# Patient Record
Sex: Male | Born: 1940 | Race: White | Hispanic: No | Marital: Married | Smoking: Never smoker
Health system: Southern US, Community
[De-identification: ages and names within clinical notes are randomized; demographics above are authoritative.]

## PROBLEM LIST (undated history)

## (undated) DIAGNOSIS — B029 Zoster without complications: Secondary | ICD-10-CM

## (undated) DIAGNOSIS — R112 Nausea with vomiting, unspecified: Secondary | ICD-10-CM

## (undated) DIAGNOSIS — K623 Rectal prolapse: Secondary | ICD-10-CM

## (undated) DIAGNOSIS — D369 Benign neoplasm, unspecified site: Secondary | ICD-10-CM

## (undated) DIAGNOSIS — M1990 Unspecified osteoarthritis, unspecified site: Secondary | ICD-10-CM

## (undated) DIAGNOSIS — E7800 Pure hypercholesterolemia, unspecified: Secondary | ICD-10-CM

## (undated) DIAGNOSIS — K5790 Diverticulosis of intestine, part unspecified, without perforation or abscess without bleeding: Secondary | ICD-10-CM

## (undated) DIAGNOSIS — L439 Lichen planus, unspecified: Secondary | ICD-10-CM

## (undated) DIAGNOSIS — K649 Unspecified hemorrhoids: Secondary | ICD-10-CM

## (undated) DIAGNOSIS — E611 Iron deficiency: Secondary | ICD-10-CM

## (undated) DIAGNOSIS — H269 Unspecified cataract: Secondary | ICD-10-CM

## (undated) DIAGNOSIS — Z9889 Other specified postprocedural states: Secondary | ICD-10-CM

## (undated) DIAGNOSIS — I10 Essential (primary) hypertension: Secondary | ICD-10-CM

## (undated) DIAGNOSIS — N400 Enlarged prostate without lower urinary tract symptoms: Secondary | ICD-10-CM

## (undated) DIAGNOSIS — D649 Anemia, unspecified: Secondary | ICD-10-CM

## (undated) DIAGNOSIS — D126 Benign neoplasm of colon, unspecified: Secondary | ICD-10-CM

## (undated) DIAGNOSIS — C4491 Basal cell carcinoma of skin, unspecified: Secondary | ICD-10-CM

## (undated) DIAGNOSIS — H9319 Tinnitus, unspecified ear: Secondary | ICD-10-CM

## (undated) HISTORY — DX: Unspecified osteoarthritis, unspecified site: M19.90

## (undated) HISTORY — PX: OTHER SURGICAL HISTORY: SHX169

## (undated) HISTORY — DX: Rectal prolapse: K62.3

## (undated) HISTORY — DX: Tinnitus, unspecified ear: H93.19

## (undated) HISTORY — DX: Unspecified cataract: H26.9

## (undated) HISTORY — PX: COLONOSCOPY: SHX174

## (undated) HISTORY — PX: INGUINAL HERNIA REPAIR: SUR1180

## (undated) HISTORY — PX: KNEE SURGERY: SHX244

## (undated) HISTORY — DX: Diverticulosis of intestine, part unspecified, without perforation or abscess without bleeding: K57.90

## (undated) HISTORY — DX: Benign neoplasm of colon, unspecified: D12.6

## (undated) HISTORY — DX: Iron deficiency: E61.1

## (undated) HISTORY — DX: Basal cell carcinoma of skin, unspecified: C44.91

## (undated) HISTORY — DX: Unspecified hemorrhoids: K64.9

## (undated) HISTORY — DX: Pure hypercholesterolemia, unspecified: E78.00

## (undated) HISTORY — PX: ROTATOR CUFF REPAIR: SHX139

## (undated) HISTORY — PX: EYE SURGERY: SHX253

## (undated) HISTORY — DX: Lichen planus, unspecified: L43.9

## (undated) HISTORY — DX: Zoster without complications: B02.9

## (undated) HISTORY — DX: Benign prostatic hyperplasia without lower urinary tract symptoms: N40.0

## (undated) HISTORY — DX: Essential (primary) hypertension: I10

---
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
## 1997-09-22 ENCOUNTER — Other Ambulatory Visit: Admission: RE | Admit: 1997-09-22 | Discharge: 1997-09-22 | Payer: Self-pay | Admitting: Otolaryngology

## 1998-09-11 ENCOUNTER — Other Ambulatory Visit: Admission: RE | Admit: 1998-09-11 | Discharge: 1998-09-11 | Payer: Self-pay | Admitting: Otolaryngology

## 2002-07-20 ENCOUNTER — Ambulatory Visit (HOSPITAL_COMMUNITY): Admission: RE | Admit: 2002-07-20 | Discharge: 2002-07-20 | Payer: Self-pay | Admitting: Family Medicine

## 2002-08-20 ENCOUNTER — Inpatient Hospital Stay (HOSPITAL_COMMUNITY): Admission: EM | Admit: 2002-08-20 | Discharge: 2002-08-22 | Payer: Self-pay | Admitting: *Deleted

## 2002-08-20 ENCOUNTER — Encounter: Payer: Self-pay | Admitting: Family Medicine

## 2004-03-29 ENCOUNTER — Ambulatory Visit: Payer: Self-pay | Admitting: Oncology

## 2004-07-12 ENCOUNTER — Ambulatory Visit (HOSPITAL_COMMUNITY): Admission: RE | Admit: 2004-07-12 | Discharge: 2004-07-12 | Payer: Self-pay | Admitting: General Surgery

## 2004-07-12 DIAGNOSIS — Z9889 Other specified postprocedural states: Secondary | ICD-10-CM | POA: Insufficient documentation

## 2004-09-30 ENCOUNTER — Ambulatory Visit: Payer: Self-pay | Admitting: Oncology

## 2005-03-31 ENCOUNTER — Ambulatory Visit: Payer: Self-pay | Admitting: Oncology

## 2005-09-28 ENCOUNTER — Ambulatory Visit: Payer: Self-pay | Admitting: Oncology

## 2005-09-30 LAB — CBC WITH DIFFERENTIAL/PLATELET
BASO%: 0.6 % (ref 0.0–2.0)
Basophils Absolute: 0 10*3/uL (ref 0.0–0.1)
EOS%: 3 % (ref 0.0–7.0)
Eosinophils Absolute: 0.1 10*3/uL (ref 0.0–0.5)
HCT: 41.5 % (ref 38.7–49.9)
HGB: 14.2 g/dL (ref 13.0–17.1)
LYMPH%: 21 % (ref 14.0–48.0)
MCH: 32.1 pg (ref 28.0–33.4)
MCHC: 34.3 g/dL (ref 32.0–35.9)
MCV: 93.4 fL (ref 81.6–98.0)
MONO#: 0.4 10*3/uL (ref 0.1–0.9)
MONO%: 8.9 % (ref 0.0–13.0)
NEUT#: 2.9 10*3/uL (ref 1.5–6.5)
NEUT%: 66.5 % (ref 40.0–75.0)
Platelets: 288 10*3/uL (ref 145–400)
RBC: 4.44 10*6/uL (ref 4.20–5.71)
RDW: 13.3 % (ref 11.2–14.6)
WBC: 4.4 10*3/uL (ref 4.0–10.0)
lymph#: 0.9 10*3/uL (ref 0.9–3.3)

## 2005-09-30 LAB — IRON AND TIBC
%SAT: 36 % (ref 20–55)
Iron: 119 ug/dL (ref 42–165)
TIBC: 333 ug/dL (ref 215–435)
UIBC: 214 ug/dL

## 2005-09-30 LAB — FERRITIN: Ferritin: 36 ng/mL (ref 22–322)

## 2006-03-27 ENCOUNTER — Ambulatory Visit: Payer: Self-pay | Admitting: Oncology

## 2006-03-31 LAB — COMPREHENSIVE METABOLIC PANEL
ALT: 18 U/L (ref 0–53)
AST: 19 U/L (ref 0–37)
Albumin: 4.2 g/dL (ref 3.5–5.2)
Alkaline Phosphatase: 53 U/L (ref 39–117)
BUN: 19 mg/dL (ref 6–23)
CO2: 26 mEq/L (ref 19–32)
Calcium: 8.8 mg/dL (ref 8.4–10.5)
Chloride: 108 mEq/L (ref 96–112)
Creatinine, Ser: 0.98 mg/dL (ref 0.40–1.50)
Glucose, Bld: 59 mg/dL — ABNORMAL LOW (ref 70–99)
Potassium: 4.3 mEq/L (ref 3.5–5.3)
Sodium: 139 mEq/L (ref 135–145)
Total Bilirubin: 0.5 mg/dL (ref 0.3–1.2)
Total Protein: 6.3 g/dL (ref 6.0–8.3)

## 2006-03-31 LAB — CBC WITH DIFFERENTIAL/PLATELET
BASO%: 0.7 % (ref 0.0–2.0)
Basophils Absolute: 0 10*3/uL (ref 0.0–0.1)
EOS%: 3.1 % (ref 0.0–7.0)
Eosinophils Absolute: 0.2 10*3/uL (ref 0.0–0.5)
HCT: 39.4 % (ref 38.7–49.9)
HGB: 13.6 g/dL (ref 13.0–17.1)
LYMPH%: 18.2 % (ref 14.0–48.0)
MCH: 32.4 pg (ref 28.0–33.4)
MCHC: 34.5 g/dL (ref 32.0–35.9)
MCV: 94.1 fL (ref 81.6–98.0)
MONO#: 0.4 10*3/uL (ref 0.1–0.9)
MONO%: 8.1 % (ref 0.0–13.0)
NEUT#: 3.5 10*3/uL (ref 1.5–6.5)
NEUT%: 69.9 % (ref 40.0–75.0)
Platelets: 265 10*3/uL (ref 145–400)
RBC: 4.19 10*6/uL — ABNORMAL LOW (ref 4.20–5.71)
RDW: 13.2 % (ref 11.2–14.6)
WBC: 5 10*3/uL (ref 4.0–10.0)
lymph#: 0.9 10*3/uL (ref 0.9–3.3)

## 2006-03-31 LAB — LACTATE DEHYDROGENASE: LDH: 132 U/L (ref 94–250)

## 2006-03-31 LAB — IRON AND TIBC
%SAT: 40 % (ref 20–55)
Iron: 113 ug/dL (ref 42–165)
TIBC: 286 ug/dL (ref 215–435)
UIBC: 173 ug/dL

## 2006-03-31 LAB — FERRITIN: Ferritin: 36 ng/mL (ref 22–322)

## 2007-03-26 ENCOUNTER — Ambulatory Visit: Payer: Self-pay | Admitting: Oncology

## 2007-03-30 LAB — CBC WITH DIFFERENTIAL/PLATELET
BASO%: 1.3 % (ref 0.0–2.0)
Basophils Absolute: 0.1 10*3/uL (ref 0.0–0.1)
EOS%: 2.9 % (ref 0.0–7.0)
Eosinophils Absolute: 0.1 10*3/uL (ref 0.0–0.5)
HCT: 39 % (ref 38.7–49.9)
HGB: 13.8 g/dL (ref 13.0–17.1)
LYMPH%: 23 % (ref 14.0–48.0)
MCH: 32.9 pg (ref 28.0–33.4)
MCHC: 35.3 g/dL (ref 32.0–35.9)
MCV: 93.2 fL (ref 81.6–98.0)
MONO#: 0.4 10*3/uL (ref 0.1–0.9)
MONO%: 9.4 % (ref 0.0–13.0)
NEUT#: 2.9 10*3/uL (ref 1.5–6.5)
NEUT%: 63.4 % (ref 40.0–75.0)
Platelets: 309 10*3/uL (ref 145–400)
RBC: 4.19 10*6/uL — ABNORMAL LOW (ref 4.20–5.71)
RDW: 13.1 % (ref 11.2–14.6)
WBC: 4.5 10*3/uL (ref 4.0–10.0)
lymph#: 1 10*3/uL (ref 0.9–3.3)

## 2007-03-30 LAB — COMPREHENSIVE METABOLIC PANEL
ALT: 17 U/L (ref 0–53)
AST: 21 U/L (ref 0–37)
Albumin: 4.5 g/dL (ref 3.5–5.2)
Alkaline Phosphatase: 46 U/L (ref 39–117)
BUN: 18 mg/dL (ref 6–23)
CO2: 26 mEq/L (ref 19–32)
Calcium: 9.8 mg/dL (ref 8.4–10.5)
Chloride: 105 mEq/L (ref 96–112)
Creatinine, Ser: 1.07 mg/dL (ref 0.40–1.50)
Glucose, Bld: 91 mg/dL (ref 70–99)
Potassium: 4.7 mEq/L (ref 3.5–5.3)
Sodium: 140 mEq/L (ref 135–145)
Total Bilirubin: 0.6 mg/dL (ref 0.3–1.2)
Total Protein: 6.9 g/dL (ref 6.0–8.3)

## 2007-03-30 LAB — IRON AND TIBC
%SAT: 26 % (ref 20–55)
Iron: 83 ug/dL (ref 42–165)
TIBC: 318 ug/dL (ref 215–435)
UIBC: 235 ug/dL

## 2007-03-30 LAB — LACTATE DEHYDROGENASE: LDH: 162 U/L (ref 94–250)

## 2007-03-30 LAB — FERRITIN: Ferritin: 42 ng/mL (ref 22–322)

## 2007-04-29 ENCOUNTER — Ambulatory Visit: Payer: Self-pay | Admitting: Internal Medicine

## 2007-09-27 ENCOUNTER — Ambulatory Visit: Payer: Self-pay | Admitting: Oncology

## 2007-09-30 LAB — CBC WITH DIFFERENTIAL/PLATELET
BASO%: 0.2 % (ref 0.0–2.0)
Basophils Absolute: 0 10*3/uL (ref 0.0–0.1)
EOS%: 2.8 % (ref 0.0–7.0)
Eosinophils Absolute: 0.1 10*3/uL (ref 0.0–0.5)
HCT: 39 % (ref 38.7–49.9)
HGB: 13.7 g/dL (ref 13.0–17.1)
LYMPH%: 19.5 % (ref 14.0–48.0)
MCH: 33 pg (ref 28.0–33.4)
MCHC: 35.2 g/dL (ref 32.0–35.9)
MCV: 93.7 fL (ref 81.6–98.0)
MONO#: 0.4 10*3/uL (ref 0.1–0.9)
MONO%: 7.5 % (ref 0.0–13.0)
NEUT#: 3.7 10*3/uL (ref 1.5–6.5)
NEUT%: 70 % (ref 40.0–75.0)
Platelets: 282 10*3/uL (ref 145–400)
RBC: 4.16 10*6/uL — ABNORMAL LOW (ref 4.20–5.71)
RDW: 13 % (ref 11.2–14.6)
WBC: 5.3 10*3/uL (ref 4.0–10.0)
lymph#: 1 10*3/uL (ref 0.9–3.3)

## 2007-09-30 LAB — IRON AND TIBC
%SAT: 27 % (ref 20–55)
Iron: 80 ug/dL (ref 42–165)
TIBC: 291 ug/dL (ref 215–435)
UIBC: 211 ug/dL

## 2007-09-30 LAB — FERRITIN: Ferritin: 36 ng/mL (ref 22–322)

## 2008-03-29 ENCOUNTER — Ambulatory Visit: Payer: Self-pay | Admitting: Oncology

## 2008-03-31 LAB — COMPREHENSIVE METABOLIC PANEL
ALT: 15 U/L (ref 0–53)
AST: 20 U/L (ref 0–37)
Albumin: 4.3 g/dL (ref 3.5–5.2)
Alkaline Phosphatase: 50 U/L (ref 39–117)
BUN: 18 mg/dL (ref 6–23)
CO2: 26 mEq/L (ref 19–32)
Calcium: 9.3 mg/dL (ref 8.4–10.5)
Chloride: 107 mEq/L (ref 96–112)
Creatinine, Ser: 1.12 mg/dL (ref 0.40–1.50)
Glucose, Bld: 69 mg/dL — ABNORMAL LOW (ref 70–99)
Potassium: 5 mEq/L (ref 3.5–5.3)
Sodium: 142 mEq/L (ref 135–145)
Total Bilirubin: 0.7 mg/dL (ref 0.3–1.2)
Total Protein: 6.6 g/dL (ref 6.0–8.3)

## 2008-03-31 LAB — CBC WITH DIFFERENTIAL/PLATELET
BASO%: 0.9 % (ref 0.0–2.0)
Basophils Absolute: 0 10*3/uL (ref 0.0–0.1)
EOS%: 3.5 % (ref 0.0–7.0)
Eosinophils Absolute: 0.2 10*3/uL (ref 0.0–0.5)
HCT: 41.1 % (ref 38.7–49.9)
HGB: 14.4 g/dL (ref 13.0–17.1)
LYMPH%: 20.6 % (ref 14.0–48.0)
MCH: 33.2 pg (ref 28.0–33.4)
MCHC: 35 g/dL (ref 32.0–35.9)
MCV: 94.8 fL (ref 81.6–98.0)
MONO#: 0.4 10*3/uL (ref 0.1–0.9)
MONO%: 7.9 % (ref 0.0–13.0)
NEUT#: 3 10*3/uL (ref 1.5–6.5)
NEUT%: 67.1 % (ref 40.0–75.0)
Platelets: 278 10*3/uL (ref 145–400)
RBC: 4.34 10*6/uL (ref 4.20–5.71)
RDW: 12.8 % (ref 11.2–14.6)
WBC: 4.5 10*3/uL (ref 4.0–10.0)
lymph#: 0.9 10*3/uL (ref 0.9–3.3)

## 2008-03-31 LAB — FERRITIN: Ferritin: 27 ng/mL (ref 22–322)

## 2008-03-31 LAB — LACTATE DEHYDROGENASE: LDH: 136 U/L (ref 94–250)

## 2008-03-31 LAB — IRON AND TIBC
%SAT: 44 % (ref 20–55)
Iron: 131 ug/dL (ref 42–165)
TIBC: 296 ug/dL (ref 215–435)
UIBC: 165 ug/dL

## 2008-08-26 DIAGNOSIS — B009 Herpesviral infection, unspecified: Secondary | ICD-10-CM | POA: Insufficient documentation

## 2008-08-26 DIAGNOSIS — Z8719 Personal history of other diseases of the digestive system: Secondary | ICD-10-CM | POA: Insufficient documentation

## 2008-08-26 DIAGNOSIS — D509 Iron deficiency anemia, unspecified: Secondary | ICD-10-CM | POA: Insufficient documentation

## 2010-10-11 NOTE — Op Note (Signed)
NAME:  DANTRE, YEARWOOD NO.:  [id number]   MEDICAL RECORD NO.:  [id number]          PATIENT TYPE:  AMB   LOCATION:  DAY                          FACILITY:  [facility]   PHYSICIAN:  Adolph Pollack, M.D.DATE OF BIRTH:  1940/11/20   DATE OF PROCEDURE:  07/12/2004  DATE OF DISCHARGE:                                 OPERATIVE REPORT   PREOPERATIVE DIAGNOSES:  Bilateral inguinal hernias.   POSTOPERATIVE DIAGNOSES:  Bilateral direct inguinal hernia   PROCEDURE:  Laparoscopic repair of bilateral inguinal hernias with mesh.   SURGEON:  Adolph Pollack, M.D.   ANESTHESIA:  General.   INDICATIONS:  Mr. Gavigan is a 70 year old male who it has known he had a  unilateral inguinal hernia and then has developed bilateral inguinal  hernias. He now presents for repair. The procedure and the risks were  discussed with an preoperatively.   TECHNIQUE:  He is seen in the holding area and then brought to the operating  room, placed supine on the operating room and general anesthetic was  administered. An in and out catheterization was performed to empty the  bladder. The hair on the abdominal wall and groin area was clipped and the  area was sterilely prepped and draped. Dilute Marcaine solution was then  infiltrated in the subumbilical region and a transverse subumbilical skin  incision was made through skin and subcutaneous tissue. Using blunt  dissection, the right anterior fascia was exposed and a small incision made  in it. I dissected the rectus muscle medially, exposing the posterior rectus  sheath. A balloon dissection device was then placed into the extraperitoneal  space and under laparoscopic vision balloon dissection was performed in the  extraperitoneal space.   The balloon dissection device was then removed, the trocar was introduced  into the extraperitoneal space and CO2 gas insufflated  creating a working  area. The laparoscope was introduced.  Under direct  vision, two 5 mm trocars  were placed through a small lower midline incision. Blunt dissection was  used to expose Cooper's ligament bilaterally. A direct hernia defect and sac  were noted medially and extraperitoneal fatty contents were dissected free  from the hernia sac and replaced back into the extraperitoneal space. Using  blunt dissection, the fibrofatty tissue of the anterior and lateral  abdominal walls was cleared off exposing the musculature up to the level of  the umbilicus. The spermatic cord was isolated and a window created around  it. The small peritoneum was then stripped down off the spermatic cord back  to the level of the umbilicus.   Following this, the left side was approached.  In a similar fashion, I  dissected the fibrofatty tissue free from the anterior and lateral abdominal  walls. A direct hernia was noted and extraperitoneal fatty contents were  placed back into the extraperitoneal space. The spermatic cord was isolated  and a window created around it. The peritoneum was stripped off the cord  back to the level of the umbilicus. A piece of 5 x 6 inch polypropylene mesh  with a partial longitudinal  slit cut into it was then placed into the left  extraperitoneal space and positioned appropriately with two tails wrapped  around the cord. The mesh was then anchored to Cooper's ligament, the  anterior and lateral abdominal walls with spiral tacks.     This provided more than adequate coverage of the direct, indirect and  femoral spaces.   Following this a similar piece of 5 x 6 polypropylene mesh with partial  longitudinal slit cut into it was then placed into the right extraperitoneal  space and positioned approximately with two tails wrapped around the cord.  The mesh was then anchored to Coopers ligament, the anterior and lateral  abdominal wall with spiral tacks. This provided more than adequate coverage  of the direct, indirect and femoral spaces.   At  this point hemostasis was noted be adequate. The inferolateral aspect of  both pieces of mesh was held down and the CO2 gas was released. The  instruments and trocars were then removed. The right anterior rectus sheath  defect was closed with interrupted #0 Vicryl sutures. The skin incisions  were closed with 4-0 Monocryl subcuticular stitches followed by Steri-Strips  and sterile dressings.   He tolerated the procedure well without any apparent complications and was  taken to recovery in satisfactory condition.      TJR/MEDQ  D:  07/12/2004  T:  07/12/2004  Job:  [id number]   cc:   Vale Haven. Andrey Campanile, M.D.  [address]  [place]  [place] [address]  Fax: [phone]

## 2010-10-11 NOTE — H&P (Signed)
NAME:  Charles Norton, Charles Norton NO.:  [id number]   MEDICAL RECORD NO.:  [id number]                   PATIENT TYPE:   LOCATION:                                       FACILITY:   PHYSICIAN:  Stanley C. Andrey Campanile, M.D.             DATE OF BIRTH:   DATE OF ADMISSION:  DATE OF DISCHARGE:                                HISTORY & PHYSICAL   HISTORY OF PRESENT ILLNESS:  This complex case involves a 70 year old white  male who was seen in the office three days prior to admission with right  frontal maxillary sinus pain along with nasal congestion, post nasal  drainage and some colored mucous. He was begun on Keflex but when the  headache did not abate and his head pain worsened, he was admitted.  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 He had a history of migraines prior to that surgery. He has not  had headaches again for a five year period until this episodes this past  week. The patient also in early February underwent a 2 pint [facility] where he received saline infusions in return. His  hematocrit was 40 prior to this but subsequently, he was found to have a  hemoglobin of 7.8. Appropriate evaluation showed no nodes,  hepatosplenomegaly or other abnormalities and he had no focal symptoms  except fatigue. A phone consultation with the [facility] suggested an  injection of Procrit 40,000 units and he was placed on iron supplementation  and iron was added to his diet. His activities were curtailed. After three  plus week period, his hemoglobin was 9.1 and retic count was only 1.6.  Arrangements were made for Hematology consultation. However, in view of the  potential of a non-responsive infection with anemia, this complicated this  admission. Also complicating the admission was the fact that the day of  admission, he had  some redness on the right forehead. However, no stiff  neck. There was some serious question as to whether this could be ophthalmic  zoster impending.   ALLERGIES:  No known drug allergies.   MEDICATIONS:  Only recently on Clarinex and Keflex for three days. He has no  medications prior to his anemia forming.   PAST MEDICAL/SURGICAL HISTORY:  He has had surgery on both knees in the  past.   SOCIAL HISTORY:  No tobacco. He does drink a beer per day. 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 His hearing and dental situation has been  normal. He wears glasses and keeps his eye exams to date.   FAMILY HISTORY:  Noncontributory to present illness.   REVIEW OF SYSTEMS:  Really was unremarkable. The positives on his review of  systems was that he knows he has  a left inguinal hernia. 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 He currently and in the past has had no chronic  other health problems. He has no bowel or urinary symptoms. He has no skin  lesions which have changed. His vision has been normal. He has no stiff  neck.   PHYSICAL EXAMINATION:  VITAL SIGNS: Temperature 98.6, blood pressure 128/78,  pulse 76 and regular. Respiratory rate 16.  SKIN: Warm and dry with good turgor. No acute rash. No bruising noted. No  petechia.  HEENT: No nodes appreciated. Throat clear. There is postnasal rhinorrhea  which is still yellow in color. He is tender to palpation in the frontal,  maxillary and zygomatic area on the right. There are small red raised,  almost urticarial looking lesions with perhaps early vesicles.  LUNGS: Clear to auscultation and percussion though he does have some cough.  There is no dyspnea or murmur.  ABDOMEN: Soft and nontender. Bowel sounds normal.  RECTAL: Guaiac negative stool.  GU: Left inguinal hernia. No masses.  NEURO: Within normal limits.   IMPRESSION:  1. Partially treated upper respiratory infection with possibility of zoster.  2. Unresponsive anemia  of undetermined etiology.   PLAN:  See orders.                                                Stanley C. Andrey Campanile, M.D.    SCW/MEDQ  D:  08/20/2002  T:  08/21/2002  Job:  [id number]

## 2010-10-11 NOTE — Consult Note (Signed)
NAME:  Charles Norton, Charles Norton NO.:  [id number]   MEDICAL RECORD NO.:  [id number]                   PATIENT TYPE:  INP   LOCATION:  0451                                 FACILITY:  [facility]   PHYSICIAN:  Samul Dada, M.D.            DATE OF BIRTH:  05/18/1941   DATE OF CONSULTATION:  DATE OF DISCHARGE:                                   CONSULTATION   HISTORY OF PRESENT ILLNESS:  Charles Norton is a 70 year old white married male  who I am asked to see in consultation by Dr. Margrett Rud for evaluation  of recently diagnosed anemia. Mr. Drab has been in remarkably good health  his entire life. He was admitted through the emergency room today because of  a right frontal and right retro bulbar headache that has persisted for  approximately the past week. The headache is described as being fairly  constant and associated with some sensation of pressure. Also a feeling of  bee stings and sensitivity of the right forehead and scalp. The patient  denies any prior history of shingles and no history of really any headache  disorder. He has had some mild nausea but no vomiting. No fever. A couple of  weeks ago, he did have some cold like symptoms with cough, slight phlegm and  congestion. He was given an antibiotic which he took twice a day for about  four days. Also some Clarinex, Tylenol and Robitussin. Those symptoms seem  to have resolved. It was felt that the patient needed admission to the  hospital for the possibility of shingles outbreak in the area of sensitivity  and also for symptomatic control of his fairly severe headache. In  association with these symptoms, the patient has recently developed anemia.  Apparently, back in October of 2003, his hematocrit was 44 and he went to  the [facility] where through the benefit of a phoresis machine, he was able  to give the equivalent of 2 pints of red cells. Following this procedure, he  did have some shortness of  breath particularly with the exertion of playing  tennis. Symptoms gradually resolved and the patient once again went to the  [facility] in late January where his hematocrit was said to be 40 and once  again, he underwent the same procedure. This time, the patient apparently  felt more dyspneic which has persisted. This was especially true after  tennis but apparently not true with walking. Four weeks ago, hemoglobin was  apparently 7.8. A stool Hemoccult was negative. The patient took iron  supplements one a day for 30 days and also received a Procrit shot. A week  later, hemoglobin was 8.1 and a week following that, hemoglobin was 9.1.  Hemoglobin was again 9.1 on this last Wednesday, August 17, 2002. Retic count  was low. Today, in the emergency room, the hemoglobin is 9.8 and hematocrit  30.3. However,  the retic count remains low. The patient denies any  disturbance of bowel and any suggestion that there is blood in the stool.  The stools were a little dark when he was taking iron. There has been change  of bowel habits or anything suspicious regarding the GI tract. No history of  pagophagia or any prior history of anemia.   PAST MEDICAL HISTORY:  Fairly benign. The patient has had arthroscopy on  both knees dating back to the 1980's. Apparently he was admitted overnight.  Two years ago, he had arthroscopic surgery on his left knee as an  outpatient. Four years ago, he had a sphenoid growth that was benign and  this was removed as an outpatient by Dr. Jearld Fenton. Prior to that, he was having  a lot of headaches which were relieved by this procedure. No other apparent  significant medical problems.   ALLERGIES:  No known drug allergies.   MEDICATIONS:  The patient is on no regular medications other than perhaps a  multivitamin.   FAMILY HISTORY:  Negative for blood disorders or cancer. In fact, the  patient's family is remarkably healthy with many of the members living to  advanced age.  Mother apparently died at age 40 of tuberculosis. Father died  at age 14 of problems related to aortic aneurysm. Brother age 64 has a  seizure disorder. There is a sister who is healthy. The patient has no  children.   SOCIAL HISTORY:  No use of cigarettes or other tobacco products. The patient  drinks beer and wine in moderation on a daily basis. He was born in [place]  and raised in [place]. Graduated [employer]. He lives in  [place] with his wife of 33 years. 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 Retired five years ago. He and his wife are  active athletically playing tennis and golf. They travel a lot. 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   REVIEW OF SYSTEMS:  Neuro examination is basically negative. The patient  uses reading glasses. Vision and hearing are good. He has never had any  shingles. No heart or lung problems. No history of any change in bowel  habits or any significant problems with eating. Occasionally he will have  some reflux. Weight has been stable at 156 pounds. Height 5'10. No  anorexia, nausea and vomiting, or abdominal pain. No history of liver or  thyroid problems. No heart or lung problems. No urinary or prostate  problems. No history of swelling of the legs or blood clots. No bleeding or  bruising. No arthritis, back or neck pain. No fever, chills, or night  sweats. 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 No psych  problems.   PHYSICAL EXAMINATION:  GENERAL: Mr. Nuon is a very healthy appearing  gentleman in no acute distress. Headache has apparently improved.  VITAL SIGNS: Respiratory rate 20 and unlabored. Pulse 80 and regular. Blood  pressure 151/89. Temperature 98.6. O2 sat on room air was 98%. HEENT: There is some slight papular irregularity to the right forehead but  no vesicles are present. No obvious  erythema. No scleral icterus. Pupils are  equal, round, and reactive to light and accommodation. Mouth and pharynx  benign. Teeth well maintained.  NECK: Without adenopathy, thyroid enlargement or bruit. No axillary or  inguinal adenopathy.  LUNGS: Normal.  HEART: Normal.  BACK: No skeletal tenderness or deformity.  ABDOMEN: Very soft, nontender with no organomegaly or masses palpable.  EXTREMITIES: No peripheral edema, clubbing, erythema, petechia or purpura.  He has fungal infection involving his toenails. 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  NEURO: Grossly normal examination.   LABORATORY DATA:  White count 7.0, ANC 5.7 with 82% polys. Hemoglobin and  hematocrit 9.8 and 30.3. MCV 75.8. Platelets 354,000. Inspection of the  peripheral smear discloses changes consistent with iron deficiency,  primarily hypochromia. No other remarkable features were present. I did not  see any evidence of myelodysplasia or any erythroid or myeloid immaturity.  Retic count was 1.5 with absolute being 59.9 which is not elevated.  Chemistries notable for sodium of 133, BUN 12, creatinine 1.1, normal liver  function studies including albumin of 4.2 and an LDH of 127. Currently  pending are iron, TIBC, Ferritin, vitamin B12 level and a heptoglobin.   DIAGNOSTIC IMPRESSION:  The patient apparently had a chest x-ray which the  results I have not seen.   IMPRESSION:  Anemia with slightly low MCV and absence of retic site response  with peripheral smear that suggest iron deficiency anemia. We will await the  results of the iron studies. Clearly if the patient is iron deficient, then  he should undergo a GI workup. He tells me that he had a sigmoid  approximately eight years ago but has never  had a colonoscopy. If the etiology of the anemia remains obscure, then the  patient will probably need a bone marrow aspirate and biopsy. We will check   stools for occult blood.   Thank you for this consultation.                                                Samul Dada, M.D.    DSM/MEDQ  D:  08/20/2002  T:  08/21/2002  Job:  [id number]   cc:   Vale Haven. Andrey Campanile, M.D.  [address]  [place]  [place] [address]  Fax: [phone]

## 2010-10-11 NOTE — Discharge Summary (Signed)
NAME:  Charles Norton, Charles Norton                           ACCOUNT NO.:  [id number]   MEDICAL RECORD NO.:  [id number]                   PATIENT TYPE:  INP   LOCATION:  0451                                 FACILITY:  [facility]   PHYSICIAN:  Stanley C. Andrey Campanile, M.D.             DATE OF BIRTH:  Apr 20, 1941   DATE OF ADMISSION:  08/20/2002  DATE OF DISCHARGE:  08/22/2002                                 DISCHARGE SUMMARY   [name] is a complex case in that six weeks or so prior to admission he  had donated two units of blood at the [facility] and received a liter of  saline to replace.  His hematocrit at that time was reportedly 40.  A couple  of weeks thereafter he was seen with a hemoglobin of 7.8 with indices that  were not specifically focal.  I talked with oncology at the time and we  placed him on one injection of Procrit and Niferex iron supplement.  His  hemoglobin moved up to 9.1 over a three-week period, however, his  reticulocyte count had stayed low.  In the meantime he developed an upper  respiratory infection with nasal congestion and frontal headache.  He was  placed on Keflex and three days later his headache had not improved.  He was  seen in the emergency room with a reddened area that was sensitive over his  right forehead which appeared to be early Zoster or a cellulitis.  He was  admitted for IV antibiotics and appropriate therapy.   PAST MEDICAL HISTORY:  Was well dictated on admission history and physical  which I do not have before me and will not redictate.   HOSPITAL COURSE:  The patient was admitted and placed on IV Rocephin, he was  also placed on Valtrex one gram b.i.d. for the possibility of early Zoster  although no vesicles were seen.  Consultation with hematology suggested  doing iron studies and his ferritin was found to be low despite iron  supplementation.  His hemoglobin on admission was 9.8 which was the highest  it had been over the past month, his stool was  guaiac negative, and he had  no symptoms or signs of blood loss or other complaint other than are listed  above.  Dr. Mariel Sleet suggested GI consultation for upper and lower  endoscopy after iron supplementation and this was arranged for approximately  two weeks later.  The patient was also seen in ENT consultation who felt that this was either  a cellulitis or Zoster and agreed with therapy as outlined.  The erythema  stayed during his hospitalization but did not worsen.  His headache was  fairly steady and he developed some sensitivity with neuritis-like component  which would be more compatible with Zoster.  He was discharged to be  followed as an outpatient.   DISCHARGE DIAGNOSES:  1. Cellulitis right forehead, question Herpes Zoster.  2. 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   DISCHARGE MEDICATIONS:  1. Valtrex one gram t.i.d. x4 more days.  2. Keflex 500 b.i.d.  3. Iberet folic 500 daily.  4. Vicodin p.r.n. headache.   FOLLOW UP:  Follow up in the office in one week.    COMPLICATIONS:  None.   CONDITION ON DISCHARGE:  Improved.                                               Stanley C. Andrey Campanile, M.D.    SCW/MEDQ  D:  08/24/2002  T:  08/25/2002  Job:  [id number]

## 2010-10-25 ENCOUNTER — Other Ambulatory Visit: Payer: Self-pay | Admitting: Dermatology

## 2012-02-20 ENCOUNTER — Other Ambulatory Visit: Payer: Self-pay | Admitting: Dermatology

## 2013-06-25 ENCOUNTER — Encounter: Payer: Self-pay | Admitting: *Deleted

## 2014-08-02 DIAGNOSIS — H02402 Unspecified ptosis of left eyelid: Secondary | ICD-10-CM | POA: Diagnosis not present

## 2014-09-05 DIAGNOSIS — I10 Essential (primary) hypertension: Secondary | ICD-10-CM | POA: Diagnosis not present

## 2014-09-05 DIAGNOSIS — H2511 Age-related nuclear cataract, right eye: Secondary | ICD-10-CM | POA: Diagnosis not present

## 2014-09-05 DIAGNOSIS — Z961 Presence of intraocular lens: Secondary | ICD-10-CM | POA: Diagnosis not present

## 2014-09-05 DIAGNOSIS — H02422 Myogenic ptosis of left eyelid: Secondary | ICD-10-CM | POA: Diagnosis not present

## 2014-09-05 DIAGNOSIS — H21562 Pupillary abnormality, left eye: Secondary | ICD-10-CM | POA: Diagnosis not present

## 2014-09-05 DIAGNOSIS — Z87891 Personal history of nicotine dependence: Secondary | ICD-10-CM | POA: Diagnosis not present

## 2014-09-21 DIAGNOSIS — H2511 Age-related nuclear cataract, right eye: Secondary | ICD-10-CM | POA: Diagnosis not present

## 2014-09-21 DIAGNOSIS — H21562 Pupillary abnormality, left eye: Secondary | ICD-10-CM | POA: Diagnosis not present

## 2014-09-21 DIAGNOSIS — Z01812 Encounter for preprocedural laboratory examination: Secondary | ICD-10-CM | POA: Diagnosis not present

## 2014-09-21 DIAGNOSIS — H02402 Unspecified ptosis of left eyelid: Secondary | ICD-10-CM | POA: Diagnosis not present

## 2014-09-21 DIAGNOSIS — Z961 Presence of intraocular lens: Secondary | ICD-10-CM | POA: Diagnosis not present

## 2014-09-21 DIAGNOSIS — Z885 Allergy status to narcotic agent status: Secondary | ICD-10-CM | POA: Diagnosis not present

## 2014-09-27 DIAGNOSIS — Z7982 Long term (current) use of aspirin: Secondary | ICD-10-CM | POA: Diagnosis not present

## 2014-09-27 DIAGNOSIS — H02402 Unspecified ptosis of left eyelid: Secondary | ICD-10-CM | POA: Diagnosis not present

## 2014-09-27 DIAGNOSIS — K219 Gastro-esophageal reflux disease without esophagitis: Secondary | ICD-10-CM | POA: Diagnosis not present

## 2014-09-27 DIAGNOSIS — Z886 Allergy status to analgesic agent status: Secondary | ICD-10-CM | POA: Diagnosis not present

## 2014-09-27 DIAGNOSIS — Z85828 Personal history of other malignant neoplasm of skin: Secondary | ICD-10-CM | POA: Diagnosis not present

## 2014-09-27 DIAGNOSIS — Z9842 Cataract extraction status, left eye: Secondary | ICD-10-CM | POA: Diagnosis not present

## 2014-09-27 DIAGNOSIS — Z79899 Other long term (current) drug therapy: Secondary | ICD-10-CM | POA: Diagnosis not present

## 2014-09-27 DIAGNOSIS — Z961 Presence of intraocular lens: Secondary | ICD-10-CM | POA: Diagnosis not present

## 2014-10-03 DIAGNOSIS — Z885 Allergy status to narcotic agent status: Secondary | ICD-10-CM | POA: Diagnosis not present

## 2014-10-03 DIAGNOSIS — Z48810 Encounter for surgical aftercare following surgery on the sense organs: Secondary | ICD-10-CM | POA: Diagnosis not present

## 2014-10-03 DIAGNOSIS — R030 Elevated blood-pressure reading, without diagnosis of hypertension: Secondary | ICD-10-CM | POA: Diagnosis not present

## 2014-10-03 DIAGNOSIS — F419 Anxiety disorder, unspecified: Secondary | ICD-10-CM | POA: Diagnosis not present

## 2014-10-03 DIAGNOSIS — K219 Gastro-esophageal reflux disease without esophagitis: Secondary | ICD-10-CM | POA: Diagnosis not present

## 2015-01-09 DIAGNOSIS — L814 Other melanin hyperpigmentation: Secondary | ICD-10-CM | POA: Diagnosis not present

## 2015-01-09 DIAGNOSIS — Z85828 Personal history of other malignant neoplasm of skin: Secondary | ICD-10-CM | POA: Diagnosis not present

## 2015-01-09 DIAGNOSIS — L570 Actinic keratosis: Secondary | ICD-10-CM | POA: Diagnosis not present

## 2015-01-09 DIAGNOSIS — D225 Melanocytic nevi of trunk: Secondary | ICD-10-CM | POA: Diagnosis not present

## 2015-01-09 DIAGNOSIS — D1801 Hemangioma of skin and subcutaneous tissue: Secondary | ICD-10-CM | POA: Diagnosis not present

## 2015-01-09 DIAGNOSIS — B351 Tinea unguium: Secondary | ICD-10-CM | POA: Diagnosis not present

## 2015-01-09 DIAGNOSIS — D2262 Melanocytic nevi of left upper limb, including shoulder: Secondary | ICD-10-CM | POA: Diagnosis not present

## 2015-01-09 DIAGNOSIS — D2261 Melanocytic nevi of right upper limb, including shoulder: Secondary | ICD-10-CM | POA: Diagnosis not present

## 2015-01-22 DIAGNOSIS — Z23 Encounter for immunization: Secondary | ICD-10-CM | POA: Diagnosis not present

## 2015-01-22 DIAGNOSIS — S66911A Strain of unspecified muscle, fascia and tendon at wrist and hand level, right hand, initial encounter: Secondary | ICD-10-CM | POA: Diagnosis not present

## 2015-03-27 DIAGNOSIS — Z23 Encounter for immunization: Secondary | ICD-10-CM | POA: Diagnosis not present

## 2015-06-07 DIAGNOSIS — R030 Elevated blood-pressure reading, without diagnosis of hypertension: Secondary | ICD-10-CM | POA: Diagnosis not present

## 2015-08-06 DIAGNOSIS — Z136 Encounter for screening for cardiovascular disorders: Secondary | ICD-10-CM | POA: Diagnosis not present

## 2015-08-06 DIAGNOSIS — Z862 Personal history of diseases of the blood and blood-forming organs and certain disorders involving the immune mechanism: Secondary | ICD-10-CM | POA: Diagnosis not present

## 2015-08-06 DIAGNOSIS — Z0000 Encounter for general adult medical examination without abnormal findings: Secondary | ICD-10-CM | POA: Diagnosis not present

## 2015-08-06 DIAGNOSIS — Z1389 Encounter for screening for other disorder: Secondary | ICD-10-CM | POA: Diagnosis not present

## 2015-08-06 DIAGNOSIS — M25531 Pain in right wrist: Secondary | ICD-10-CM | POA: Diagnosis not present

## 2015-08-20 DIAGNOSIS — M25531 Pain in right wrist: Secondary | ICD-10-CM | POA: Diagnosis not present

## 2016-01-02 DIAGNOSIS — D1801 Hemangioma of skin and subcutaneous tissue: Secondary | ICD-10-CM | POA: Diagnosis not present

## 2016-01-02 DIAGNOSIS — L814 Other melanin hyperpigmentation: Secondary | ICD-10-CM | POA: Diagnosis not present

## 2016-01-02 DIAGNOSIS — L570 Actinic keratosis: Secondary | ICD-10-CM | POA: Diagnosis not present

## 2016-01-02 DIAGNOSIS — D2272 Melanocytic nevi of left lower limb, including hip: Secondary | ICD-10-CM | POA: Diagnosis not present

## 2016-01-02 DIAGNOSIS — Z85828 Personal history of other malignant neoplasm of skin: Secondary | ICD-10-CM | POA: Diagnosis not present

## 2016-01-02 DIAGNOSIS — D225 Melanocytic nevi of trunk: Secondary | ICD-10-CM | POA: Diagnosis not present

## 2016-01-02 DIAGNOSIS — D2261 Melanocytic nevi of right upper limb, including shoulder: Secondary | ICD-10-CM | POA: Diagnosis not present

## 2016-01-02 DIAGNOSIS — D485 Neoplasm of uncertain behavior of skin: Secondary | ICD-10-CM | POA: Diagnosis not present

## 2016-01-02 DIAGNOSIS — C44319 Basal cell carcinoma of skin of other parts of face: Secondary | ICD-10-CM | POA: Diagnosis not present

## 2016-01-02 DIAGNOSIS — L821 Other seborrheic keratosis: Secondary | ICD-10-CM | POA: Diagnosis not present

## 2016-01-10 DIAGNOSIS — M1812 Unilateral primary osteoarthritis of first carpometacarpal joint, left hand: Secondary | ICD-10-CM | POA: Diagnosis not present

## 2016-04-29 DIAGNOSIS — M1812 Unilateral primary osteoarthritis of first carpometacarpal joint, left hand: Secondary | ICD-10-CM | POA: Diagnosis not present

## 2016-04-29 DIAGNOSIS — M19041 Primary osteoarthritis, right hand: Secondary | ICD-10-CM | POA: Diagnosis not present

## 2016-04-30 DIAGNOSIS — H2511 Age-related nuclear cataract, right eye: Secondary | ICD-10-CM | POA: Diagnosis not present

## 2016-04-30 DIAGNOSIS — H17821 Peripheral opacity of cornea, right eye: Secondary | ICD-10-CM | POA: Diagnosis not present

## 2016-04-30 DIAGNOSIS — Z961 Presence of intraocular lens: Secondary | ICD-10-CM | POA: Diagnosis not present

## 2016-09-24 DIAGNOSIS — Z85828 Personal history of other malignant neoplasm of skin: Secondary | ICD-10-CM | POA: Diagnosis not present

## 2016-09-24 DIAGNOSIS — D485 Neoplasm of uncertain behavior of skin: Secondary | ICD-10-CM | POA: Diagnosis not present

## 2016-09-24 DIAGNOSIS — L821 Other seborrheic keratosis: Secondary | ICD-10-CM | POA: Diagnosis not present

## 2016-09-24 DIAGNOSIS — C44319 Basal cell carcinoma of skin of other parts of face: Secondary | ICD-10-CM | POA: Diagnosis not present

## 2016-10-13 DIAGNOSIS — C44319 Basal cell carcinoma of skin of other parts of face: Secondary | ICD-10-CM | POA: Diagnosis not present

## 2016-10-13 DIAGNOSIS — Z85828 Personal history of other malignant neoplasm of skin: Secondary | ICD-10-CM | POA: Diagnosis not present

## 2016-10-21 DIAGNOSIS — Z4802 Encounter for removal of sutures: Secondary | ICD-10-CM | POA: Diagnosis not present

## 2016-12-11 DIAGNOSIS — E785 Hyperlipidemia, unspecified: Secondary | ICD-10-CM | POA: Diagnosis not present

## 2016-12-11 DIAGNOSIS — R030 Elevated blood-pressure reading, without diagnosis of hypertension: Secondary | ICD-10-CM | POA: Diagnosis not present

## 2016-12-11 DIAGNOSIS — Z0000 Encounter for general adult medical examination without abnormal findings: Secondary | ICD-10-CM | POA: Diagnosis not present

## 2017-02-16 DIAGNOSIS — M1712 Unilateral primary osteoarthritis, left knee: Secondary | ICD-10-CM | POA: Diagnosis not present

## 2017-02-16 DIAGNOSIS — M25562 Pain in left knee: Secondary | ICD-10-CM | POA: Diagnosis not present

## 2017-02-17 NOTE — Progress Notes (Signed)
Please place orders in EPIC as patient is being scheduled for a pre-op appointment! Thank you! 

## 2017-02-20 ENCOUNTER — Other Ambulatory Visit (HOSPITAL_COMMUNITY): Payer: Self-pay | Admitting: Emergency Medicine

## 2017-02-20 NOTE — H&P (Signed)
TOTAL KNEE ADMISSION H&P  Patient is being admitted for left total knee arthroplasty.  Subjective:  Chief Complaint:     Left knee primary OA / pain  HPI: Charles Norton, 76 y.o. male, has a history of pain and functional disability in the left knee due to arthritis and has failed non-surgical conservative treatments for greater than 12 weeks to include NSAID's and/or analgesics and activity modification.  Onset of symptoms was gradual, starting 50+ years ago with gradually worsening course since that time. The patient noted prior procedures on the knee to include  arthroscopy and menisectomy on the left knee(s).  Patient currently rates pain in the left knee(s) at 4 out of 10 with activity. Patient has night pain, worsening of pain with activity and weight bearing, pain that interferes with activities of daily living, pain with passive range of motion, crepitus and joint swelling.  Patient has evidence of periarticular osteophytes and joint space narrowing by imaging studies.  There is no active infection.  Risks, benefits and expectations were discussed with the patient.  Risks including but not limited to the risk of anesthesia, blood clots, nerve damage, blood vessel damage, failure of the prosthesis, infection and up to and including death.  Patient understand the risks, benefits and expectations and wishes to proceed with surgery.   PCP: Carol Ada, MD  D/C Plans:       Home   Post-op Meds:       No Rx given  Tranexamic Acid:      To be given - IV   Decadron:      Is to be given  FYI:     ASA  Norco  Zofran (will need rx for home post-op)  DME:   Rx given for - RW and 3-n-1  PT:   Rx given for OPPT   Patient Active Problem List   Diagnosis Date Noted  . HERPES SIMPLEX INFECTION, RECURRENT 08/26/2008  . ANEMIA, IRON DEFICIENCY 08/26/2008  . DIVERTICULOSIS, COLON, HX OF 08/26/2008  . INGUINAL HERNIORRHAPHY, HX OF 07/12/2004   Past Medical History:  Diagnosis Date  . Iron  deficiency    anemia    Past Surgical History:  Procedure Laterality Date  . Hernia bilateral    . KNEE SURGERY      No prescriptions prior to admission.   Allergies  Allergen Reactions  . Other Nausea And Vomiting    All pain medications.  Can tolerate if anti-nausea medication first     Social History  Substance Use Topics  . Smoking status: Never Smoker  . Smokeless tobacco: Not on file  . Alcohol use Not on file    Family History  Problem Relation Age of Onset  . Tuberculosis Mother   . Hypertension Father   . Heart disease Father   . Obesity Brother      Review of Systems  Constitutional: Negative.   HENT: Negative.   Eyes: Negative.   Respiratory: Negative.   Cardiovascular: Negative.   Gastrointestinal: Negative.   Genitourinary: Negative.   Musculoskeletal: Positive for joint pain.  Skin: Negative.   Neurological: Negative.   Endo/Heme/Allergies: Negative.   Psychiatric/Behavioral: Negative.     Objective:  Physical Exam  Constitutional: He is oriented to person, place, and time. He appears well-developed.  HENT:  Head: Normocephalic.  Eyes: Pupils are equal, round, and reactive to light.  Neck: Neck supple. No JVD present. No tracheal deviation present. No thyromegaly present.  Cardiovascular: Normal rate, regular rhythm and  intact distal pulses.   Respiratory: Effort normal and breath sounds normal. No respiratory distress. He has no wheezes.  GI: Soft. There is no tenderness. There is no guarding.  Musculoskeletal:       Left knee: He exhibits decreased range of motion, swelling and bony tenderness. He exhibits no ecchymosis, no deformity, no laceration and no erythema. Tenderness found.  Lymphadenopathy:    He has no cervical adenopathy.  Neurological: He is alert and oriented to person, place, and time.  Skin: Skin is warm and dry.  Psychiatric: He has a normal mood and affect.      Imaging Review Plain radiographs demonstrate severe  degenerative joint disease of the left knee(s). The overall alignment is neutral. The bone quality appears to be good for age and reported activity level.  Assessment/Plan:  End stage arthritis, left knee   The patient history, physical examination, clinical judgment of the provider and imaging studies are consistent with end stage degenerative joint disease of the left knee(s) and total knee arthroplasty is deemed medically necessary. The treatment options including medical management, injection therapy arthroscopy and arthroplasty were discussed at length. The risks and benefits of total knee arthroplasty were presented and reviewed. The risks due to aseptic loosening, infection, stiffness, patella tracking problems, thromboembolic complications and other imponderables were discussed. The patient acknowledged the explanation, agreed to proceed with the plan and consent was signed. Patient is being admitted for inpatient treatment for surgery, pain control, PT, OT, prophylactic antibiotics, VTE prophylaxis, progressive ambulation and ADL's and discharge planning. The patient is planning to be discharged home.     [place] Journey Ratterman   PA-C  02/20/2017, 1:51 PM

## 2017-02-20 NOTE — Patient Instructions (Addendum)
Charles Norton  02/20/2017   Your procedure is scheduled on: 03-03-17  Report to [facility] Main  Entrance   Report to admitting at [facility]   Call this number if you have problems the morning of surgery [phone]   Remember: ONLY 1 PERSON MAY GO WITH YOU TO SHORT STAY TO GET  READY MORNING OF YOUR SURGERY.  Do not eat food or drink liquids :After Midnight.     Take these medicines the morning of surgery with A SIP OF WATER: none                                 You may not have any metal on your body including hair pins and              piercings  Do not wear jewelry, make-up, lotions, powders or perfumes, deodorant              Men may shave face and neck.   Do not bring valuables to the hospital. [place].  Contacts, dentures or bridgework may not be worn into surgery.  Leave suitcase in the car. After surgery it may be brought to your room.                Please read over the following fact sheets you were given: _____________________________________________________________________           [facility] - Preparing for Surgery Before surgery, you can play an important role.  Because skin is not sterile, your skin needs to be as free of germs as possible.  You can reduce the number of germs on your skin by washing with CHG (chlorahexidine gluconate) soap before surgery.  CHG is an antiseptic cleaner which kills germs and bonds with the skin to continue killing germs even after washing. Please DO NOT use if you have an allergy to CHG or antibacterial soaps.  If your skin becomes reddened/irritated stop using the CHG and inform your nurse when you arrive at Short Stay. Do not shave (including legs and underarms) for at least 48 hours prior to the first CHG shower.  You may shave your face/neck. Please follow these instructions carefully:  1.  Shower with CHG Soap the night before surgery and the  morning of  Surgery.  2.  If you choose to wash your hair, wash your hair first as usual with your  normal  shampoo.  3.  After you shampoo, rinse your hair and body thoroughly to remove the  shampoo.                           4.  Use CHG as you would any other liquid soap.  You can apply chg directly  to the skin and wash                       Gently with a scrungie or clean washcloth.  5.  Apply the CHG Soap to your body ONLY FROM THE NECK DOWN.   Do not use on face/ open  Wound or open sores. Avoid contact with eyes, ears mouth and genitals (private parts).                       Wash face,  Genitals (private parts) with your normal soap.             6.  Wash thoroughly, paying special attention to the area where your surgery  will be performed.  7.  Thoroughly rinse your body with warm water from the neck down.  8.  DO NOT shower/wash with your normal soap after using and rinsing off  the CHG Soap.                9.  Pat yourself dry with a clean towel.            10.  Wear clean pajamas.            11.  Place clean sheets on your bed the night of your first shower and do not  sleep with pets. Day of Surgery : Do not apply any lotions/deodorants the morning of surgery.  Please wear clean clothes to the hospital/surgery center.  FAILURE TO FOLLOW THESE INSTRUCTIONS MAY RESULT IN THE CANCELLATION OF YOUR SURGERY PATIENT SIGNATURE_________________________________  NURSE SIGNATURE__________________________________  ________________________________________________________________________   Adam Phenix  An incentive spirometer is a tool that can help keep your lungs clear and active. This tool measures how well you are filling your lungs with each breath. Taking long deep breaths may help reverse or decrease the chance of developing breathing (pulmonary) problems (especially infection) following:  A long period of time when you are unable to move or be active. BEFORE  THE PROCEDURE   If the spirometer includes an indicator to show your best effort, your nurse or respiratory therapist will set it to a desired goal.  If possible, sit up straight or lean slightly forward. Try not to slouch.  Hold the incentive spirometer in an upright position. INSTRUCTIONS FOR USE  1. Sit on the edge of your bed if possible, or sit up as far as you can in bed or on a chair. 2. Hold the incentive spirometer in an upright position. 3. Breathe out normally. 4. Place the mouthpiece in your mouth and seal your lips tightly around it. 5. Breathe in slowly and as deeply as possible, raising the piston or the ball toward the top of the column. 6. Hold your breath for 3-5 seconds or for as long as possible. Allow the piston or ball to fall to the bottom of the column. 7. Remove the mouthpiece from your mouth and breathe out normally. 8. Rest for a few seconds and repeat Steps 1 through 7 at least 10 times every 1-2 hours when you are awake. Take your time and take a few normal breaths between deep breaths. 9. The spirometer may include an indicator to show your best effort. Use the indicator as a goal to work toward during each repetition. 10. After each set of 10 deep breaths, practice coughing to be sure your lungs are clear. If you have an incision (the cut made at the time of surgery), support your incision when coughing by placing a pillow or rolled up towels firmly against it. Once you are able to get out of bed, walk around indoors and cough well. You may stop using the incentive spirometer when instructed by your caregiver.  RISKS AND COMPLICATIONS  Take your time so you do not get  dizzy or light-headed.  If you are in pain, you may need to take or ask for pain medication before doing incentive spirometry. It is harder to take a deep breath if you are having pain. AFTER USE  Rest and breathe slowly and easily.  It can be helpful to keep track of a log of your progress.  Your caregiver can provide you with a simple table to help with this. If you are using the spirometer at home, follow these instructions: [place] IF:   You are having difficultly using the spirometer.  You have trouble using the spirometer as often as instructed.  Your pain medication is not giving enough relief while using the spirometer.  You develop fever of 100.5 F (38.1 C) or higher. SEEK IMMEDIATE MEDICAL CARE IF:   You cough up bloody sputum that had not been present before.  You develop fever of 102 F (38.9 C) or greater.  You develop worsening pain at or near the incision site. MAKE SURE YOU:   Understand these instructions.  Will watch your condition.  Will get help right away if you are not doing well or get worse. Document Released: 09/22/2006 Document Revised: 08/04/2011 Document Reviewed: 11/23/2006 Exit[employer] Patient Information 2014 Exit[employer], [place].   ________________________________________________________________________  WHAT IS A BLOOD TRANSFUSION? Blood Transfusion Information  A transfusion is the replacement of blood or some of its parts. Blood is made up of multiple cells which provide different functions.  Red blood cells carry oxygen and are used for blood loss replacement.  White blood cells fight against infection.  Platelets control bleeding.  Plasma helps clot blood.  Other blood products are available for specialized needs, such as hemophilia or other clotting disorders. BEFORE THE TRANSFUSION  Who gives blood for transfusions?   Healthy volunteers who are fully evaluated to make sure their blood is safe. This is blood bank blood. Transfusion therapy is the safest it has ever been in the practice of medicine. Before blood is taken from a donor, a complete history is taken to make sure that person has no history of diseases nor engages in risky social behavior (examples are intravenous drug use or sexual activity with multiple  partners). The donor's travel history is screened to minimize risk of transmitting infections, such as malaria. The donated blood is tested for signs of infectious diseases, such as HIV and hepatitis. The blood is then tested to be sure it is compatible with you in order to minimize the chance of a transfusion reaction. If you or a relative donates blood, this is often done in anticipation of surgery and is not appropriate for emergency situations. It takes many days to process the donated blood. RISKS AND COMPLICATIONS Although transfusion therapy is very safe and saves many lives, the main dangers of transfusion include:   Getting an infectious disease.  Developing a transfusion reaction. This is an allergic reaction to something in the blood you were given. Every precaution is taken to prevent this. The decision to have a blood transfusion has been considered carefully by your caregiver before blood is given. Blood is not given unless the benefits outweigh the risks. AFTER THE TRANSFUSION  Right after receiving a blood transfusion, you will usually feel much better and more energetic. This is especially true if your red blood cells have gotten low (anemic). The transfusion raises the level of the red blood cells which carry oxygen, and this usually causes an energy increase.  The nurse administering the transfusion will  monitor you carefully for complications. HOME CARE INSTRUCTIONS  No special instructions are needed after a transfusion. You may find your energy is better. Speak with your caregiver about any limitations on activity for underlying diseases you may have. SEEK MEDICAL CARE IF:   Your condition is not improving after your transfusion.  You develop redness or irritation at the intravenous (IV) site. SEEK IMMEDIATE MEDICAL CARE IF:  Any of the following symptoms occur over the next 12 hours:  Shaking chills.  You have a temperature by mouth above 102 F (38.9 C), not  controlled by medicine.  Chest, back, or muscle pain.  People around you feel you are not acting correctly or are confused.  Shortness of breath or difficulty breathing.  Dizziness and fainting.  You get a rash or develop hives.  You have a decrease in urine output.  Your urine turns a dark color or changes to pink, red, or brown. Any of the following symptoms occur over the next 10 days:  You have a temperature by mouth above 102 F (38.9 C), not controlled by medicine.  Shortness of breath.  Weakness after normal activity.  The white part of the eye turns yellow (jaundice).  You have a decrease in the amount of urine or are urinating less often.  Your urine turns a dark color or changes to pink, red, or brown. Document Released: 05/09/2000 Document Revised: 08/04/2011 Document Reviewed: 12/27/2007 [facility] Patient Information 2014 [place], [place].     _______________________________________________________________________

## 2017-02-24 ENCOUNTER — Encounter (HOSPITAL_COMMUNITY)
Admission: RE | Admit: 2017-02-24 | Discharge: 2017-02-24 | Disposition: A | Discharge status: Home / Self Care | Payer: Medicare HMO | Source: Ambulatory Visit | Attending: Orthopedic Surgery | Admitting: Orthopedic Surgery

## 2017-02-24 ENCOUNTER — Encounter (HOSPITAL_COMMUNITY): Payer: Self-pay

## 2017-02-24 ENCOUNTER — Encounter (INDEPENDENT_AMBULATORY_CARE_PROVIDER_SITE_OTHER): Payer: Self-pay

## 2017-02-24 DIAGNOSIS — Z01812 Encounter for preprocedural laboratory examination: Secondary | ICD-10-CM | POA: Diagnosis not present

## 2017-02-24 DIAGNOSIS — M1712 Unilateral primary osteoarthritis, left knee: Secondary | ICD-10-CM | POA: Insufficient documentation

## 2017-02-24 HISTORY — DX: Anemia, unspecified: D64.9

## 2017-02-24 HISTORY — DX: Other specified postprocedural states: Z98.890

## 2017-02-24 HISTORY — DX: Unspecified osteoarthritis, unspecified site: M19.90

## 2017-02-24 HISTORY — DX: Other specified postprocedural states: R11.2

## 2017-02-24 LAB — CBC
HCT: 39.8 % (ref 39.0–52.0)
Hemoglobin: 13.7 g/dL (ref 13.0–17.0)
MCH: 32.2 pg (ref 26.0–34.0)
MCHC: 34.4 g/dL (ref 30.0–36.0)
MCV: 93.4 fL (ref 78.0–100.0)
Platelets: 292 10*3/uL (ref 150–400)
RBC: 4.26 MIL/uL (ref 4.22–5.81)
RDW: 13.3 % (ref 11.5–15.5)
WBC: 5.9 10*3/uL (ref 4.0–10.5)

## 2017-02-24 LAB — SURGICAL PCR SCREEN
MRSA, PCR: NEGATIVE
Staphylococcus aureus: NEGATIVE

## 2017-02-24 LAB — ABO/RH: ABO/RH(D): O POS

## 2017-02-26 ENCOUNTER — Ambulatory Visit (INDEPENDENT_AMBULATORY_CARE_PROVIDER_SITE_OTHER): Payer: Medicare HMO | Admitting: Cardiology

## 2017-02-26 ENCOUNTER — Encounter: Payer: Self-pay | Admitting: Cardiology

## 2017-02-26 ENCOUNTER — Telehealth: Payer: Self-pay

## 2017-02-26 VITALS — BP 134/90 | HR 80 | Ht 69.0 in | Wt 157.1 lb

## 2017-02-26 DIAGNOSIS — R9431 Abnormal electrocardiogram [ECG] [EKG]: Secondary | ICD-10-CM

## 2017-02-26 DIAGNOSIS — M25862 Other specified joint disorders, left knee: Secondary | ICD-10-CM | POA: Diagnosis not present

## 2017-02-26 DIAGNOSIS — E785 Hyperlipidemia, unspecified: Secondary | ICD-10-CM | POA: Diagnosis not present

## 2017-02-26 DIAGNOSIS — Z01810 Encounter for preprocedural cardiovascular examination: Secondary | ICD-10-CM | POA: Insufficient documentation

## 2017-02-26 DIAGNOSIS — I10 Essential (primary) hypertension: Secondary | ICD-10-CM | POA: Diagnosis not present

## 2017-02-26 DIAGNOSIS — R030 Elevated blood-pressure reading, without diagnosis of hypertension: Secondary | ICD-10-CM | POA: Insufficient documentation

## 2017-02-26 DIAGNOSIS — Z01818 Encounter for other preprocedural examination: Secondary | ICD-10-CM | POA: Diagnosis not present

## 2017-02-26 NOTE — Addendum Note (Signed)
Addended by: Mattie Marlin on: 02/26/2017 03:29 PM   Modules accepted: Orders

## 2017-02-26 NOTE — Progress Notes (Signed)
Cardiology Office Note:    Date:  02/26/2017   ID:  Charles Norton, DOB 1941/05/10, MRN [id number]  PCP:  Carol Ada, MD  Cardiologist:  Jenean Lindau, MD   Referring MD: Carol Ada, MD    ASSESSMENT:    1. Preoperative cardiovascular examination   2. Abnormal EKG   3. Dyslipidemia   4. Elevated blood pressure reading in office without diagnosis of hypertension    PLAN:    In order of problems listed above:    I explained preoperative risk stratification process to the patientnd he vocalized understanding. He has multiple risk factors for coronary artery disease and leads a sedentary lifestyle. For this reason, to risk stratify him he will undergo Lexiscan sestamibi. His blood pressure will have to be monitored. It appears to me that he has an element of white coat hypertension. If his stress test is negative, then he is not at high risk for coronary events during the aforementioned surgery. Meticulous hemodynamic monitoring will further reduce the risk of coronary events. Patient will be seen in follow-up appointment in 6 months or earlier if the patient has any concerns, at that time I will assess his lipid issues.  Medication Adjustments/Labs and Tests Ordered: Current medicines are reviewed at length with the patient today.  Concerns regarding medicines are outlined above.  No orders of the defined types were placed in this encounter.  No orders of the defined types were placed in this encounter.    History of Present Illness:    Charles Norton is a 76 y.o. male who is being seen today for the evaluation of preoperative cardiovascular examination for abnormal EKG at the request of Carol Ada, MD. Patient is a pleasant 76 year old male. He has past medical history of dyslipidemia. He mentions to me that his blood pressures generally fine. Is having issues with his left knee and contemplating knee surgery. He tells me that he is an active gentleman and does not have  any chest pains activity however because of obvious any problems he does not exercise on a regular basis. At the time of my evaluation is alert awake oriented and in no distress. He was sent here for evaluation because EKG done preop was abnormal. His wife accompanies him.   Past Medical History:  Diagnosis Date  . Anemia   . Arthritis   . Iron deficiency    anemia  . PONV (postoperative nausea and vomiting)     Past Surgical History:  Procedure Laterality Date  . Hernia bilateral    . KNEE SURGERY     arthrosocopy   . removal of cyst     sphenoid cyst   . ROTATOR CUFF REPAIR Bilateral     Current Medications: Current Meds  Medication Sig  . aspirin 81 MG tablet Take 81 mg by mouth daily.  . Glucosamine-Chondroitin (GLUCOSAMINE CHONDR COMPLEX PO) Take 1,500 mg by mouth daily.   . magnesium oxide (MAG-OX) 400 MG tablet Take 400 mg by mouth daily.  . Multiple Vitamin (MULTI VITAMIN DAILY PO) Take 1 tablet by mouth daily.   . Potassium 99 MG TABS Take 99 mg by mouth daily.   Marland [employer] Specialty Vitamins Products (ICAPS LUTEIN-ZEAXANTHIN PO) Take 1 tablet by mouth daily.  . Turmeric 500 MG CAPS Take 500 mg by mouth daily.     Allergies:   Other   Social History   Social History  . Marital status: Married    Spouse name: N/A  . Number  of children: N/A  . Years of education: N/A   Social History Main Topics  . Smoking status: Never Smoker  . Smokeless tobacco: Never Used  . Alcohol use Yes     Comment: 15 dinks per week   . Drug use: No  . Sexual activity: Not Asked   Other Topics Concern  . None   Social History Narrative  . None     Family History: The patient's family history includes Heart disease in his father; Hypertension in his father; Obesity in his brother; Tuberculosis in his mother.  ROS:   Please see the history of present illness.    All other systems reviewed and are negative.  EKGs/Labs/Other Studies Reviewed:    The following studies were  reviewed today: I reviewed records from primary care physician office. 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 RESPIRATORY:  Clear to auscultation without rales, wheezing or rhonchi  ABDOMEN: Soft, non-tender, non-distended MUSCULOSKELETAL:  No edema; No deformity  SKIN: Warm and dry NEUROLOGIC:  Alert and oriented x 3 PSYCHIATRIC:  Normal affect    Signed, Jenean Lindau, MD  02/26/2017 3:09 PM    [facility]

## 2017-02-26 NOTE — Telephone Encounter (Signed)
Notes sent to scheduling.   

## 2017-02-26 NOTE — Patient Instructions (Signed)
Medication Instructions:  Your physician recommends that you continue on your current medications as directed. Please refer to the Current Medication list given to you today.   Labwork: None  Testing/Procedures: Your physician has requested that you have a lexiscan myoview. For further information please visit [email or web address]. Please follow instruction sheet, as given.  Lexiscan tomorrow please  Follow-Up: 6 months  Any Other Special Instructions Will Be Listed Below (If Applicable).     If you need a refill on your cardiac medications before your next appointment, please call your pharmacy.

## 2017-03-02 ENCOUNTER — Telehealth: Payer: Self-pay | Admitting: Cardiology

## 2017-03-02 ENCOUNTER — Ambulatory Visit (HOSPITAL_COMMUNITY): Payer: Medicare HMO | Attending: Internal Medicine

## 2017-03-02 DIAGNOSIS — I2510 Atherosclerotic heart disease of native coronary artery without angina pectoris: Secondary | ICD-10-CM | POA: Insufficient documentation

## 2017-03-02 DIAGNOSIS — I10 Essential (primary) hypertension: Secondary | ICD-10-CM | POA: Insufficient documentation

## 2017-03-02 DIAGNOSIS — R9439 Abnormal result of other cardiovascular function study: Secondary | ICD-10-CM | POA: Diagnosis not present

## 2017-03-02 DIAGNOSIS — Z01810 Encounter for preprocedural cardiovascular examination: Secondary | ICD-10-CM

## 2017-03-02 LAB — MYOCARDIAL PERFUSION IMAGING
LV dias vol: 89 mL (ref 62–150)
LV sys vol: 34 mL
Peak HR: 81 {beats}/min
RATE: 0.29
Rest HR: 57 {beats}/min
SDS: 3
SRS: 3
SSS: 5
TID: 0.89

## 2017-03-02 MED ORDER — REGADENOSON 0.4 MG/5ML IV SOLN
0.4000 mg | Freq: Once | INTRAVENOUS | Status: AC
  Administered 2017-03-02: 0.4 mg via INTRAVENOUS

## 2017-03-02 MED ORDER — TECHNETIUM TC 99M TETROFOSMIN IV KIT
10.8000 | PACK | Freq: Once | INTRAVENOUS | Status: AC | PRN
  Administered 2017-03-02: 10.8 via INTRAVENOUS
  Filled 2017-03-02: qty 11

## 2017-03-02 MED ORDER — TECHNETIUM TC 99M TETROFOSMIN IV KIT
31.9000 | PACK | Freq: Once | INTRAVENOUS | Status: AC | PRN
  Administered 2017-03-02: 31.9 via INTRAVENOUS
  Filled 2017-03-02: qty 32

## 2017-03-02 NOTE — Telephone Encounter (Signed)
Wants his cardiolite results and wants them sent to his surgeon for clearance

## 2017-03-02 NOTE — Progress Notes (Signed)
Spoke with patient to make him aware of surgery time change. Patient verbalized understanding to report to admitting at 0730 for surgery tomorrow .

## 2017-03-03 ENCOUNTER — Encounter (HOSPITAL_COMMUNITY): Payer: Self-pay | Admitting: Certified Registered Nurse Anesthetist

## 2017-03-03 ENCOUNTER — Observation Stay (HOSPITAL_COMMUNITY)
Admission: RE | Admit: 2017-03-03 | Discharge: 2017-03-04 | Disposition: A | Discharge status: Home / Self Care | Payer: Medicare HMO | Source: Ambulatory Visit | Attending: Orthopedic Surgery | Admitting: Orthopedic Surgery

## 2017-03-03 ENCOUNTER — Inpatient Hospital Stay (HOSPITAL_COMMUNITY): Payer: Medicare HMO | Admitting: Certified Registered Nurse Anesthetist

## 2017-03-03 ENCOUNTER — Encounter (HOSPITAL_COMMUNITY)
Admission: RE | Disposition: A | Discharge status: Home / Self Care | Payer: Self-pay | Source: Ambulatory Visit | Attending: Orthopedic Surgery

## 2017-03-03 DIAGNOSIS — M1712 Unilateral primary osteoarthritis, left knee: Secondary | ICD-10-CM | POA: Diagnosis not present

## 2017-03-03 DIAGNOSIS — Z96659 Presence of unspecified artificial knee joint: Secondary | ICD-10-CM

## 2017-03-03 DIAGNOSIS — D509 Iron deficiency anemia, unspecified: Secondary | ICD-10-CM | POA: Diagnosis not present

## 2017-03-03 DIAGNOSIS — E785 Hyperlipidemia, unspecified: Secondary | ICD-10-CM | POA: Diagnosis not present

## 2017-03-03 DIAGNOSIS — Z888 Allergy status to other drugs, medicaments and biological substances status: Secondary | ICD-10-CM | POA: Diagnosis not present

## 2017-03-03 DIAGNOSIS — Z96652 Presence of left artificial knee joint: Secondary | ICD-10-CM

## 2017-03-03 DIAGNOSIS — G8918 Other acute postprocedural pain: Secondary | ICD-10-CM | POA: Diagnosis not present

## 2017-03-03 HISTORY — PX: TOTAL KNEE ARTHROPLASTY: SHX125

## 2017-03-03 LAB — TYPE AND SCREEN
ABO/RH(D): O POS
Antibody Screen: NEGATIVE

## 2017-03-03 SURGERY — ARTHROPLASTY, KNEE, TOTAL
Anesthesia: Spinal | Site: Knee | Laterality: Left

## 2017-03-03 MED ORDER — TRANEXAMIC ACID 1000 MG/10ML IV SOLN
1000.0000 mg | INTRAVENOUS | Status: AC
  Administered 2017-03-03: 1000 mg via INTRAVENOUS
  Filled 2017-03-03: qty 1100

## 2017-03-03 MED ORDER — DEXAMETHASONE SODIUM PHOSPHATE 10 MG/ML IJ SOLN
10.0000 mg | Freq: Once | INTRAMUSCULAR | Status: AC
  Administered 2017-03-03: 10 mg via INTRAVENOUS

## 2017-03-03 MED ORDER — EPHEDRINE 5 MG/ML INJ
INTRAVENOUS | Status: AC
Start: 2017-03-03 — End: ?
  Filled 2017-03-03: qty 10

## 2017-03-03 MED ORDER — MIDAZOLAM HCL 2 MG/2ML IJ SOLN
INTRAMUSCULAR | Status: AC
  Administered 2017-03-03: 2 mg via INTRAVENOUS
  Filled 2017-03-03: qty 2

## 2017-03-03 MED ORDER — BUPIVACAINE HCL (PF) 0.75 % IJ SOLN
INTRAMUSCULAR | Status: DC | PRN
  Administered 2017-03-03: 2 mL via INTRATHECAL

## 2017-03-03 MED ORDER — MENTHOL 3 MG MT LOZG: 1.0000 | LOZENGE | OROMUCOSAL | Status: DC | PRN

## 2017-03-03 MED ORDER — CEFAZOLIN SODIUM-DEXTROSE 2-4 GM/100ML-% IV SOLN
2.0000 g | Freq: Four times a day (QID) | INTRAVENOUS | Status: AC
  Administered 2017-03-03 (×2): 2 g via INTRAVENOUS
  Filled 2017-03-03 (×2): qty 100

## 2017-03-03 MED ORDER — DOCUSATE SODIUM 100 MG PO CAPS: 100.0000 mg | ORAL_CAPSULE | Freq: Two times a day (BID) | ORAL | 0 refills | Status: DC

## 2017-03-03 MED ORDER — METHOCARBAMOL 1000 MG/10ML IJ SOLN
500.0000 mg | Freq: Four times a day (QID) | INTRAVENOUS | Status: DC | PRN
  Administered 2017-03-03: 500 mg via INTRAVENOUS
  Filled 2017-03-03: qty 550

## 2017-03-03 MED ORDER — EPHEDRINE SULFATE-NACL 50-0.9 MG/10ML-% IV SOSY
PREFILLED_SYRINGE | INTRAVENOUS | Status: DC | PRN
  Administered 2017-03-03 (×2): 5 mg via INTRAVENOUS
  Administered 2017-03-03: 10 mg via INTRAVENOUS

## 2017-03-03 MED ORDER — DEXAMETHASONE SODIUM PHOSPHATE 10 MG/ML IJ SOLN
10.0000 mg | Freq: Once | INTRAMUSCULAR | Status: AC
  Administered 2017-03-04: 09:00:00 10 mg via INTRAVENOUS
  Filled 2017-03-03: qty 1

## 2017-03-03 MED ORDER — MIDAZOLAM HCL 2 MG/2ML IJ SOLN
1.0000 mg | INTRAMUSCULAR | Status: DC | PRN
  Administered 2017-03-03: 2 mg via INTRAVENOUS

## 2017-03-03 MED ORDER — ROPIVACAINE HCL 7.5 MG/ML IJ SOLN
INTRAMUSCULAR | Status: DC | PRN
  Administered 2017-03-03: 20 mL via PERINEURAL

## 2017-03-03 MED ORDER — KETOROLAC TROMETHAMINE 30 MG/ML IJ SOLN
INTRAMUSCULAR | Status: DC | PRN
  Administered 2017-03-03: 30 mg

## 2017-03-03 MED ORDER — METHOCARBAMOL 500 MG PO TABS: 500.0000 mg | ORAL_TABLET | Freq: Four times a day (QID) | ORAL | 0 refills | Status: DC | PRN

## 2017-03-03 MED ORDER — 0.9 % SODIUM CHLORIDE (POUR BTL) OPTIME
TOPICAL | Status: DC | PRN
  Administered 2017-03-03: 1000 mL

## 2017-03-03 MED ORDER — PROPOFOL 10 MG/ML IV BOLUS
INTRAVENOUS | Status: AC
  Filled 2017-03-03: qty 20

## 2017-03-03 MED ORDER — SODIUM CHLORIDE 0.9 % IJ SOLN
INTRAMUSCULAR | Status: AC
  Filled 2017-03-03: qty 50

## 2017-03-03 MED ORDER — SODIUM CHLORIDE 0.9 % IJ SOLN
INTRAMUSCULAR | Status: DC | PRN
  Administered 2017-03-03: 30 mL

## 2017-03-03 MED ORDER — ONDANSETRON HCL 4 MG/2ML IJ SOLN
INTRAMUSCULAR | Status: DC | PRN
  Administered 2017-03-03: 4 mg via INTRAVENOUS

## 2017-03-03 MED ORDER — ASPIRIN 81 MG PO CHEW: 81.0000 mg | CHEWABLE_TABLET | Freq: Two times a day (BID) | ORAL | 0 refills | Status: AC

## 2017-03-03 MED ORDER — HYDROCODONE-ACETAMINOPHEN 7.5-325 MG PO TABS: 1.0000 | ORAL_TABLET | ORAL | 0 refills | Status: DC | PRN

## 2017-03-03 MED ORDER — HYDROMORPHONE HCL-NACL 0.5-0.9 MG/ML-% IV SOSY: 0.2500 mg | PREFILLED_SYRINGE | INTRAVENOUS | Status: DC | PRN

## 2017-03-03 MED ORDER — DIPHENHYDRAMINE HCL 25 MG PO CAPS: 25.0000 mg | ORAL_CAPSULE | Freq: Four times a day (QID) | ORAL | Status: DC | PRN

## 2017-03-03 MED ORDER — POLYETHYLENE GLYCOL 3350 17 G PO PACK: 17.0000 g | PACK | Freq: Two times a day (BID) | ORAL | Status: DC

## 2017-03-03 MED ORDER — ONDANSETRON HCL 4 MG/2ML IJ SOLN
INTRAMUSCULAR | Status: AC
  Filled 2017-03-03: qty 2

## 2017-03-03 MED ORDER — PROPOFOL 10 MG/ML IV BOLUS
INTRAVENOUS | Status: AC
Start: 2017-03-03 — End: ?
  Filled 2017-03-03: qty 60

## 2017-03-03 MED ORDER — PHENOL 1.4 % MT LIQD: 1.0000 | OROMUCOSAL | Status: DC | PRN

## 2017-03-03 MED ORDER — PROPOFOL 500 MG/50ML IV EMUL
INTRAVENOUS | Status: DC | PRN
  Administered 2017-03-03: 100 ug/kg/min via INTRAVENOUS

## 2017-03-03 MED ORDER — DOCUSATE SODIUM 100 MG PO CAPS
100.0000 mg | ORAL_CAPSULE | Freq: Two times a day (BID) | ORAL | Status: DC
  Administered 2017-03-03 – 2017-03-04 (×2): 100 mg via ORAL
  Filled 2017-03-03 (×2): qty 1

## 2017-03-03 MED ORDER — CEFAZOLIN SODIUM-DEXTROSE 2-4 GM/100ML-% IV SOLN
INTRAVENOUS | Status: AC
  Filled 2017-03-03: qty 100

## 2017-03-03 MED ORDER — BUPIVACAINE-EPINEPHRINE (PF) 0.25% -1:200000 IJ SOLN
INTRAMUSCULAR | Status: AC
  Filled 2017-03-03: qty 30

## 2017-03-03 MED ORDER — ALUM & MAG HYDROXIDE-SIMETH 200-200-20 MG/5ML PO SUSP: 15.0000 mL | ORAL | Status: DC | PRN

## 2017-03-03 MED ORDER — BISACODYL 10 MG RE SUPP: 10.0000 mg | Freq: Every day | RECTAL | Status: DC | PRN

## 2017-03-03 MED ORDER — PROPOFOL 10 MG/ML IV BOLUS
INTRAVENOUS | Status: DC | PRN
  Administered 2017-03-03: 20 mg via INTRAVENOUS

## 2017-03-03 MED ORDER — MAGNESIUM CITRATE PO SOLN: 1.0000 | Freq: Once | ORAL | Status: DC | PRN

## 2017-03-03 MED ORDER — METOCLOPRAMIDE HCL 5 MG PO TABS
5.0000 mg | ORAL_TABLET | Freq: Three times a day (TID) | ORAL | Status: DC | PRN
  Administered 2017-03-03: 16:00:00 10 mg via ORAL
  Filled 2017-03-03: qty 2

## 2017-03-03 MED ORDER — TRANEXAMIC ACID 1000 MG/10ML IV SOLN
1000.0000 mg | Freq: Once | INTRAVENOUS | Status: AC
  Administered 2017-03-03: 1000 mg via INTRAVENOUS
  Filled 2017-03-03: qty 1100

## 2017-03-03 MED ORDER — LACTATED RINGERS IV SOLN
INTRAVENOUS | Status: DC
  Administered 2017-03-03: 10:00:00 via INTRAVENOUS
  Administered 2017-03-03: 1000 mL via INTRAVENOUS
  Administered 2017-03-03: 11:00:00 via INTRAVENOUS

## 2017-03-03 MED ORDER — CELECOXIB 200 MG PO CAPS
200.0000 mg | ORAL_CAPSULE | Freq: Two times a day (BID) | ORAL | Status: DC
  Administered 2017-03-03 – 2017-03-04 (×2): 200 mg via ORAL
  Filled 2017-03-03 (×2): qty 1

## 2017-03-03 MED ORDER — KETOROLAC TROMETHAMINE 30 MG/ML IJ SOLN
INTRAMUSCULAR | Status: AC
Start: 2017-03-03 — End: ?
  Filled 2017-03-03: qty 1

## 2017-03-03 MED ORDER — DEXAMETHASONE SODIUM PHOSPHATE 10 MG/ML IJ SOLN
INTRAMUSCULAR | Status: AC
  Filled 2017-03-03: qty 1

## 2017-03-03 MED ORDER — STERILE WATER FOR IRRIGATION IR SOLN
Status: DC | PRN
  Administered 2017-03-03: 2000 mL

## 2017-03-03 MED ORDER — METOCLOPRAMIDE HCL 5 MG/ML IJ SOLN: 5.0000 mg | Freq: Three times a day (TID) | INTRAMUSCULAR | Status: DC | PRN

## 2017-03-03 MED ORDER — METHOCARBAMOL 500 MG PO TABS
500.0000 mg | ORAL_TABLET | Freq: Four times a day (QID) | ORAL | Status: DC | PRN
  Administered 2017-03-04: 09:00:00 500 mg via ORAL
  Filled 2017-03-03: qty 1

## 2017-03-03 MED ORDER — SODIUM CHLORIDE 0.9 % IV SOLN
INTRAVENOUS | Status: DC
  Administered 2017-03-03: 14:00:00 100 mL/h via INTRAVENOUS
  Administered 2017-03-04: via INTRAVENOUS

## 2017-03-03 MED ORDER — HYDROCODONE-ACETAMINOPHEN 7.5-325 MG PO TABS
1.0000 | ORAL_TABLET | ORAL | Status: DC
  Administered 2017-03-03: 16:00:00 1 via ORAL
  Administered 2017-03-03 – 2017-03-04 (×3): 2 via ORAL
  Administered 2017-03-04: 1 via ORAL
  Filled 2017-03-03 (×3): qty 2
  Filled 2017-03-03: qty 1
  Filled 2017-03-03: qty 2

## 2017-03-03 MED ORDER — HYDROMORPHONE HCL-NACL 0.5-0.9 MG/ML-% IV SOSY: 0.5000 mg | PREFILLED_SYRINGE | INTRAVENOUS | Status: DC | PRN

## 2017-03-03 MED ORDER — POLYETHYLENE GLYCOL 3350 17 G PO PACK: 17.0000 g | PACK | Freq: Two times a day (BID) | ORAL | 0 refills | Status: DC

## 2017-03-03 MED ORDER — FERROUS SULFATE 325 (65 FE) MG PO TABS: 325.0000 mg | ORAL_TABLET | Freq: Three times a day (TID) | ORAL | Status: DC

## 2017-03-03 MED ORDER — CHLORHEXIDINE GLUCONATE 4 % EX LIQD: 60.0000 mL | Freq: Once | CUTANEOUS | Status: DC

## 2017-03-03 MED ORDER — FENTANYL CITRATE (PF) 100 MCG/2ML IJ SOLN
50.0000 ug | INTRAMUSCULAR | Status: DC | PRN
  Administered 2017-03-03: 100 ug via INTRAVENOUS

## 2017-03-03 MED ORDER — FERROUS SULFATE 325 (65 FE) MG PO TABS
325.0000 mg | ORAL_TABLET | Freq: Three times a day (TID) | ORAL | 3 refills | Status: DC
Start: 2017-03-03 — End: 2020-05-03

## 2017-03-03 MED ORDER — ONDANSETRON HCL 4 MG/2ML IJ SOLN: 4.0000 mg | Freq: Four times a day (QID) | INTRAMUSCULAR | Status: DC | PRN

## 2017-03-03 MED ORDER — CEFAZOLIN SODIUM-DEXTROSE 2-4 GM/100ML-% IV SOLN
2.0000 g | INTRAVENOUS | Status: AC
  Administered 2017-03-03: 2 g via INTRAVENOUS

## 2017-03-03 MED ORDER — ONDANSETRON HCL 4 MG PO TABS
4.0000 mg | ORAL_TABLET | Freq: Four times a day (QID) | ORAL | Status: DC | PRN
  Administered 2017-03-03: 20:00:00 4 mg via ORAL
  Filled 2017-03-03 (×2): qty 1

## 2017-03-03 MED ORDER — BUPIVACAINE-EPINEPHRINE (PF) 0.25% -1:200000 IJ SOLN
INTRAMUSCULAR | Status: DC | PRN
  Administered 2017-03-03: 30 mL

## 2017-03-03 MED ORDER — FENTANYL CITRATE (PF) 100 MCG/2ML IJ SOLN
INTRAMUSCULAR | Status: AC
  Administered 2017-03-03: 100 ug via INTRAVENOUS
  Filled 2017-03-03: qty 2

## 2017-03-03 MED ORDER — ASPIRIN 81 MG PO CHEW
81.0000 mg | CHEWABLE_TABLET | Freq: Two times a day (BID) | ORAL | Status: DC
  Administered 2017-03-03 – 2017-03-04 (×2): 81 mg via ORAL
  Filled 2017-03-03 (×2): qty 1

## 2017-03-03 SURGICAL SUPPLY — 45 items
BAG DECANTER FOR FLEXI CONT (MISCELLANEOUS) IMPLANT
BAG ZIPLOCK 12X15 (MISCELLANEOUS) IMPLANT
BANDAGE ACE 6X5 VEL STRL LF (GAUZE/BANDAGES/DRESSINGS) ×2 IMPLANT
BLADE SAW SGTL 11.0X1.19X90.0M (BLADE) IMPLANT
BLADE SAW SGTL 13.0X1.19X90.0M (BLADE) ×2 IMPLANT
BOWL SMART MIX CTS (DISPOSABLE) ×2 IMPLANT
CAPT KNEE TOTAL 3 ATTUNE ×2 IMPLANT
CEMENT HV SMART SET (Cement) ×4 IMPLANT
COVER SURGICAL LIGHT HANDLE (MISCELLANEOUS) ×2 IMPLANT
CUFF TOURN SGL QUICK 34 (TOURNIQUET CUFF) ×1
CUFF TRNQT CYL 34X4X40X1 (TOURNIQUET CUFF) ×1 IMPLANT
DECANTER SPIKE VIAL GLASS SM (MISCELLANEOUS) ×2 IMPLANT
DERMABOND ADVANCED (GAUZE/BANDAGES/DRESSINGS) ×1
DERMABOND ADVANCED .7 DNX12 (GAUZE/BANDAGES/DRESSINGS) ×1 IMPLANT
DRAPE U-SHAPE 47X51 STRL (DRAPES) ×2 IMPLANT
DRESSING AQUACEL AG SP 3.5X10 (GAUZE/BANDAGES/DRESSINGS) ×1 IMPLANT
DRSG AQUACEL AG SP 3.5X10 (GAUZE/BANDAGES/DRESSINGS) ×2
DURAPREP 26ML APPLICATOR (WOUND CARE) ×4 IMPLANT
ELECT REM PT RETURN 15FT ADLT (MISCELLANEOUS) ×2 IMPLANT
GLOVE BIOGEL M 7.0 STRL (GLOVE) IMPLANT
GLOVE BIOGEL PI IND STRL 7.5 (GLOVE) ×1 IMPLANT
GLOVE BIOGEL PI IND STRL 8.5 (GLOVE) ×1 IMPLANT
GLOVE BIOGEL PI INDICATOR 7.5 (GLOVE) ×1
GLOVE BIOGEL PI INDICATOR 8.5 (GLOVE) ×1
GLOVE ECLIPSE 8.0 STRL XLNG CF (GLOVE) ×4 IMPLANT
GLOVE ORTHO TXT STRL SZ7.5 (GLOVE) ×2 IMPLANT
GOWN STRL REUS W/TWL LRG LVL3 (GOWN DISPOSABLE) ×2 IMPLANT
GOWN STRL REUS W/TWL XL LVL3 (GOWN DISPOSABLE) ×2 IMPLANT
HANDPIECE INTERPULSE COAX TIP (DISPOSABLE) ×2
MANIFOLD NEPTUNE II (INSTRUMENTS) ×2 IMPLANT
PACK TOTAL KNEE CUSTOM (KITS) ×2 IMPLANT
POSITIONER SURGICAL ARM (MISCELLANEOUS) ×2 IMPLANT
SET HNDPC FAN SPRY TIP SCT (DISPOSABLE) ×1 IMPLANT
SET PAD KNEE POSITIONER (MISCELLANEOUS) ×2 IMPLANT
SUT MNCRL AB 4-0 PS2 18 (SUTURE) ×2 IMPLANT
SUT STRATAFIX 0 PDS 27 VIOLET (SUTURE) ×2
SUT VIC AB 1 CT1 36 (SUTURE) ×2 IMPLANT
SUT VIC AB 2-0 CT1 27 (SUTURE) ×3
SUT VIC AB 2-0 CT1 TAPERPNT 27 (SUTURE) ×3 IMPLANT
SUTURE STRATFX 0 PDS 27 VIOLET (SUTURE) ×1 IMPLANT
SYR 50ML LL SCALE MARK (SYRINGE) IMPLANT
TRAY FOLEY W/METER SILVER 16FR (SET/KITS/TRAYS/PACK) ×2 IMPLANT
WATER STERILE IRR 1500ML POUR (IV SOLUTION) ×4 IMPLANT
WRAP KNEE MAXI GEL POST OP (GAUZE/BANDAGES/DRESSINGS) ×2 IMPLANT
YANKAUER SUCT BULB TIP 10FT TU (MISCELLANEOUS) ×2 IMPLANT

## 2017-03-03 NOTE — Progress Notes (Signed)
Assisted Dr. Edmond Fitzgerald with left, ultrasound guided, adductor canal block. Side rails up, monitors on throughout procedure. See vital signs in flow sheet. Tolerated Procedure well. 

## 2017-03-03 NOTE — Evaluation (Signed)
Physical Therapy Evaluation Patient Details Name: DEQUANN VANDERVELDEN MRN: [id number] DOB: 1941-02-14 Today's Date: 03/03/2017   History of Present Illness  76 yo male s/p L TKA 03/03/17  Clinical Impression  On eval POD 0, pt was Min guard assist for mobility. He walked ~125 feet with a RW. Pain rated 4/10 with activity. Anticipate pt will progress well during hospital stay.     Follow Up Recommendations DC plan and follow up therapy as arranged by surgeon (OPPT)    Equipment Recommendations  None recommended by PT    Recommendations for Other Services       Precautions / Restrictions Precautions Precautions: Fall;Knee Restrictions Weight Bearing Restrictions: No RLE Weight Bearing: Weight bearing as tolerated      Mobility  Bed Mobility Overal bed mobility: Needs Assistance Bed Mobility: Supine to Sit     Supine to sit: Min guard;HOB elevated     General bed mobility comments: close guard for safety.   Transfers Overall transfer level: Needs assistance Equipment used: Rolling walker (2 wheeled) Transfers: Sit to/from Stand Sit to Stand: Min guard         General transfer comment: close guard for safety. VCs safety, hand/LE placement  Ambulation/Gait Ambulation/Gait assistance: Min guard Ambulation Distance (Feet): 125 Feet Assistive device: Rolling walker (2 wheeled) Gait Pattern/deviations: Step-to pattern;Step-through pattern;Decreased stride length     General Gait Details: close guard for safety. VCS sequence.   Stairs            Wheelchair Mobility    Modified Rankin (Stroke Patients Only)       Balance                                             Pertinent Vitals/Pain Pain Assessment: 0-10 Pain Score: 4  Pain Location: L TKA Pain Descriptors / Indicators: Sore;Aching Pain Intervention(s): Monitored during session;Repositioned;Ice applied    Home Living Family/patient expects to be discharged to:: Private  residence Living Arrangements: Spouse/significant other   Type of Home: House Home Access: Stairs to enter Entrance Stairs-Rails: Right Entrance Stairs-Number of Steps: 3 Home Layout: One level Home Equipment: Environmental consultant - 2 wheels;Cane - single point      Prior Function Level of Independence: Independent               Hand Dominance        Extremity/Trunk Assessment        Lower Extremity Assessment Lower Extremity Assessment: Generalized weakness (s/p L TKA)    Cervical / Trunk Assessment Cervical / Trunk Assessment: Normal  Communication   Communication: No difficulties  Cognition Arousal/Alertness: Awake/alert Behavior During Therapy: WFL for tasks assessed/performed Overall Cognitive Status: Within Functional Limits for tasks assessed                                        General Comments      Exercises     Assessment/Plan    PT Assessment Patient needs continued PT services  PT Problem List Decreased strength;Decreased mobility;Decreased range of motion;Decreased activity tolerance;Decreased balance;Pain;Decreased knowledge of use of DME       PT Treatment Interventions DME instruction;Gait training;Therapeutic activities;Therapeutic exercise;Patient/family education;Balance training;Stair training;Functional mobility training    PT Goals (Current goals can be found in the Care Plan section)  Acute Rehab PT Goals Patient Stated Goal: regain plof. PT Goal Formulation: With patient Time For Goal Achievement: 03/17/17 Potential to Achieve Goals: Good    Frequency 7X/week   Barriers to discharge        Co-evaluation               AM-PAC PT "6 Clicks" Daily Activity  Outcome Measure Difficulty turning over in bed (including adjusting bedclothes, sheets and blankets)?: A Little Difficulty moving from lying on back to sitting on the side of the bed? : A Little Difficulty sitting down on and standing up from a chair with  arms (e.g., wheelchair, bedside commode, etc,.)?: A Little Help needed moving to and from a bed to chair (including a wheelchair)?: A Little Help needed walking in hospital room?: A Little Help needed climbing 3-5 steps with a railing? : A Little 6 Click Score: 18    End of Session Equipment Utilized During Treatment: Gait belt Activity Tolerance: Patient tolerated treatment well Patient left: in bed;with call bell/phone within reach   PT Visit Diagnosis: Muscle weakness (generalized) (M62.81);Difficulty in walking, not elsewhere classified (R26.2)    Time: 2409-7353 PT Time Calculation (min) (ACUTE ONLY): 18 min   Charges:   PT Evaluation $PT Eval Low Complexity: 1 Low     PT G Codes:   PT G-Codes **NOT FOR INPATIENT CLASS** Functional Assessment Tool Used: AM-PAC 6 Clicks Basic Mobility;Clinical judgement Functional Limitation: Mobility: Walking and moving around Mobility: Walking and Moving Around Current Status (G9924): At least 1 percent but less than 20 percent impaired, limited or restricted Mobility: Walking and Moving Around Goal Status [phone]): At least 1 percent but less than 20 percent impaired, limited or restricted      Weston Anna, MPT Pager: [phone]

## 2017-03-03 NOTE — Discharge Instructions (Signed)

## 2017-03-03 NOTE — Interval H&P Note (Signed)
History and Physical Interval Note:  03/03/2017 8:46 AM  Charles Norton  has presented today for surgery, with the diagnosis of Left knee osteoarthritis  The various methods of treatment have been discussed with the patient and family. After consideration of risks, benefits and other options for treatment, the patient has consented to  Procedure(s) with comments: LEFT TOTAL KNEE ARTHROPLASTY (Left) - 70 mins as a surgical intervention .  The patient's history has been reviewed, patient examined, no change in status, stable for surgery.  I have reviewed the patient's chart and labs.  Questions were answered to the patient's satisfaction.     Mauri Pole

## 2017-03-03 NOTE — Telephone Encounter (Signed)
Pt informed of normal results and that he is cleared for surgery. I have tried to contact [place] the surgery scheduling coordinator for Dr. Aurea Graff office to also inform her that pt was cleared and that results and notes can be reviewed in epic. LM to contact the [place] if there was anything else that was needed.

## 2017-03-03 NOTE — Anesthesia Procedure Notes (Signed)
Anesthesia Regional Block: Adductor canal block   Pre-Anesthetic Checklist: ,, timeout performed, Correct Patient, Correct Site, Correct Laterality, Correct Procedure, Correct Position, site marked, Risks and benefits discussed, pre-op evaluation,  At surgeon's request and post-op pain management  Laterality: Left  Prep: Maximum Sterile Barrier Precautions used, chloraprep       Needles:  Injection technique: Single-shot  Needle Type: Echogenic Stimulator Needle     Needle Length: 9cm  Needle Gauge: 21     Additional Needles:   Narrative:  Start time: 03/03/2017 9:13 AM End time: 03/03/2017 9:23 AM Injection made incrementally with aspirations every 5 mL. Anesthesiologist: Roderic Palau  Additional Notes: 2% Lidocaine skin wheel.

## 2017-03-03 NOTE — Op Note (Signed)
NAME:  Charles Norton RECORD NO.:  [id number]                             FACILITY:  [facility]      PHYSICIAN:  Pietro Cassis. Alvan Dame, M.D.  DATE OF BIRTH:  24-Mar-1941      DATE OF PROCEDURE:  03/03/2017                                     OPERATIVE REPORT         PREOPERATIVE DIAGNOSIS:  Left knee osteoarthritis.      POSTOPERATIVE DIAGNOSIS:  Left knee osteoarthritis.      FINDINGS:  The patient was noted to have complete loss of cartilage and   bone-on-bone arthritis with associated osteophytes in all three compartments of   the knee with a significant synovitis and associated effusion.      PROCEDURE:  Left total knee replacement.      COMPONENTS USED:  DePuy Attune rotating platform posterior stabilized knee   system, a size 6 femur, 7 tibia, size 8 mm PS AOX insert, and 38 anatomic patellar   button.      SURGEON:  Pietro Cassis. Alvan Dame, M.D.      ASSISTANT:  Danae Orleans, PA-C.      ANESTHESIA:  Regional and Spinal.      SPECIMENS:  None.      COMPLICATION:  None.      DRAINS:  None.  EBL: <100cc      TOURNIQUET TIME:   Total Tourniquet Time Documented: Thigh (Left) - 36 minutes Total: Thigh (Left) - 36 minutes  .      The patient was stable to the recovery room.      INDICATION FOR PROCEDURE:  Charles Norton is a 76 y.o. male patient of   mine.  The patient had been seen, evaluated, and treated conservatively in the   office with medication, activity modification, and injections.  The patient had   radiographic changes of bone-on-bone arthritis with endplate sclerosis and osteophytes noted.      The patient failed conservative measures including medication, injections, and activity modification, and at this point was ready for more definitive measures.   Based on the radiographic changes and failed conservative measures, the patient   decided to proceed with total knee replacement.  Risks of infection,   DVT, component failure, need for  revision surgery, postop course, and   expectations were all   discussed and reviewed.  Consent was obtained for benefit of pain   relief.      PROCEDURE IN DETAIL:  The patient was brought to the operative theater.   Once adequate anesthesia, preoperative antibiotics, 2 gm of Ancef, 1 gm of Tranexamic Acid, and 10 mg of Decadron administered, the patient was positioned supine with the left thigh tourniquet placed.  The  left lower extremity was prepped and draped in sterile fashion.  A time-   out was performed identifying the patient, planned procedure, and   extremity.      The left lower extremity was placed in the [facility] leg holder.  The leg was   exsanguinated, tourniquet elevated to 250 mmHg.  A midline incision was   made  followed by median parapatellar arthrotomy.  Following initial   exposure, attention was first directed to the patella.  Precut   measurement was noted to be 26 mm.  I resected down to 14-15 mm and used a   38 anatomic patellar button to restore patellar height as well as cover the cut   surface.      The lug holes were drilled and a metal shim was placed to protect the   patella from retractors and saw blades.      At this point, attention was now directed to the femur.  The femoral   canal was opened with a drill, irrigated to try to prevent fat emboli.  An   intramedullary rod was passed at 5 degrees valgus, 9 mm of bone was   resected off the distal femur.  Following this resection, the tibia was   subluxated anteriorly.  Using the extramedullary guide, 2 mm of bone was resected off   the proximal medial tibia.  We confirmed the gap would be   stable medially and laterally with a size 6 spacer block as well as confirmed   the cut was perpendicular in the coronal plane, checking with an alignment rod.      Once this was done, I sized the femur to be a size 6 in the anterior-   posterior dimension, chose a standard component based on medial and   lateral  dimension.  The size 6 rotation block was then pinned in   position anterior referenced using the C-clamp to set rotation.  The   anterior, posterior, and  chamfer cuts were made without difficulty nor   notching making certain that I was along the anterior cortex to help   with flexion gap stability.      The final box cut was made off the lateral aspect of distal femur.      At this point, the tibia was sized to be a size 7, the size 7 tray was   then pinned in position through the medial third of the tubercle,   drilled, and keel punched.  Trial reduction was now carried with a 6 femur,  7 tibia, a size 6 then up to the 8 mm PS insert, and the 38 anatomic patella botton.  The knee was brought to   extension, full extension with good flexion stability with the patella   tracking through the trochlea without application of pressure.  Given   all these findings, the trial components removed.  Final components were   opened and cement was mixed.  The knee was irrigated with normal saline   solution and pulse lavage.  The synovial lining was   then injected with 30 cc of 0.25% Marcaine with epinephrine and 1 cc of Toradol with 30 cc of NS for a total of 61 cc.      The knee was irrigated.  Final implants were then cemented onto clean and   dried cut surfaces of bone with the knee brought to extension with a size 8 mm PS trial insert.      Once the cement had fully cured, the excess cement was removed   throughout the knee.  I confirmed I was satisfied with the range of   motion and stability, and the final size 8 mm PS AOX insert was chosen.  It was   placed into the knee.      The tourniquet had been let down at 36 minutes.  No  significant   hemostasis required.  The   extensor mechanism was then reapproximated using #1 Vicryl and #0 Stratafix sutures with the knee   in flexion.  The   remaining wound was closed with 2-0 Vicryl and running 4-0 Monocryl.   The knee was cleaned, dried,  dressed sterilely using Dermabond and   Aquacel dressing.  The patient was then   brought to recovery room in stable condition, tolerating the procedure   well.   Please note that Physician Assistant, Danae Orleans, PA-C, was present for the entirety of the case, and was utilized for pre-operative positioning, peri-operative retractor management, general facilitation of the procedure.  He was also utilized for primary wound closure at the end of the case.              Pietro Cassis Alvan Dame, M.D.    03/03/2017 11:26 AM

## 2017-03-03 NOTE — Anesthesia Preprocedure Evaluation (Addendum)
Anesthesia Evaluation  Patient identified by MRN, date of birth, ID band Patient awake    Reviewed: Allergy & Precautions, H&P , NPO status , Patient's Chart, lab work & pertinent test results  History of Anesthesia Complications (+) PONV  Airway Mallampati: I  TM Distance: >3 FB Neck ROM: Full    Dental no notable dental hx. (+) Teeth Intact, Dental Advisory Given   Pulmonary neg pulmonary ROS,    Pulmonary exam normal breath sounds clear to auscultation       Cardiovascular negative cardio ROS   Rhythm:Regular Rate:Normal     Neuro/Psych negative neurological ROS  negative psych ROS   GI/Hepatic negative GI ROS, Neg liver ROS,   Endo/Other  negative endocrine ROS  Renal/GU negative Renal ROS  negative genitourinary   Musculoskeletal  (+) Arthritis ,   Abdominal   Peds  Hematology negative hematology ROS (+) anemia ,   Anesthesia Other Findings   Reproductive/Obstetrics negative OB ROS                            Anesthesia Physical Anesthesia Plan  ASA: II  Anesthesia Plan: Spinal   Post-op Pain Management:  Regional for Post-op pain   Induction: Intravenous  PONV Risk Score and Plan: 3 and Ondansetron, Dexamethasone, Midazolam and Propofol infusion  Airway Management Planned: Simple Face Mask  Additional Equipment:   Intra-op Plan:   Post-operative Plan:   Informed Consent: I have reviewed the patients History and Physical, chart, labs and discussed the procedure including the risks, benefits and alternatives for the proposed anesthesia with the patient or authorized representative who has indicated his/her understanding and acceptance.   Dental advisory given  Plan Discussed with: CRNA  Anesthesia Plan Comments:         Anesthesia Quick Evaluation

## 2017-03-03 NOTE — Transfer of Care (Signed)
Immediate Anesthesia Transfer of Care Note  Patient: Charles Norton  Procedure(s) Performed: Procedure(s) with comments: LEFT TOTAL KNEE ARTHROPLASTY (Left) - 70 mins with block  Patient Location: PACU  Anesthesia Type:Spinal  Level of Consciousness:  sedated, patient cooperative and responds to stimulation  Airway & Oxygen Therapy:Patient Spontanous Breathing and Patient connected to face mask oxgen  Post-op Assessment:  Report given to PACU RN and Post -op Vital signs reviewed and stable  Post vital signs:  Reviewed and stable  Last Vitals:  Vitals:   03/03/17 1205 03/03/17 1206  BP: 130/74   Pulse: 79 82  Resp:  20  Temp:    SpO2: 12% 16%    Complications: No apparent anesthesia complications

## 2017-03-03 NOTE — Anesthesia Postprocedure Evaluation (Signed)
Anesthesia Post Note  Patient: Charles Norton  Procedure(s) Performed: LEFT TOTAL KNEE ARTHROPLASTY (Left Knee)     Patient location during evaluation: PACU Anesthesia Type: Spinal and Regional Level of consciousness: awake and alert Pain management: pain level controlled Vital Signs Assessment: post-procedure vital signs reviewed and stable Respiratory status: spontaneous breathing and respiratory function stable Cardiovascular status: blood pressure returned to baseline and stable Postop Assessment: spinal receding and no apparent nausea or vomiting Anesthetic complications: no    Last Vitals:  Vitals:   03/03/17 1245 03/03/17 1300  BP: 138/78 140/79  Pulse: 67 65  Resp: 14 12  Temp:  (!) 36.4 C  SpO2: 98% 98%    Last Pain:  Vitals:   03/03/17 1300  TempSrc:   PainSc: 0-No pain    LLE Motor Response: Purposeful movement (03/03/17 1300) LLE Sensation: Full sensation (03/03/17 1300) RLE Motor Response: Purposeful movement (03/03/17 1300) RLE Sensation: Full sensation (03/03/17 1300) L Sensory Level: S1-Sole of foot, small toes (03/03/17 1300) R Sensory Level: L5-Outer lower leg, top of foot, great toe (03/03/17 1300)  Sharhonda Atwood,W. EDMOND

## 2017-03-03 NOTE — Anesthesia Procedure Notes (Addendum)
Spinal  Patient location during procedure: OR Start time: 03/03/2017 10:02 AM End time: 03/03/2017 10:04 AM Staffing Anesthesiologist: Roderic Palau Performed: anesthesiologist  Preanesthetic Checklist Completed: patient identified, site marked, surgical consent, pre-op evaluation, timeout performed, IV checked, risks and benefits discussed and monitors and equipment checked Spinal Block Patient position: sitting Prep: ChloraPrep Patient monitoring: heart rate, continuous pulse ox and blood pressure Approach: midline Location: L3-4 Injection technique: single-shot Needle Needle type: Pencan  Needle gauge: 24 G Needle length: 9 cm Needle insertion depth: 7 cm Assessment Sensory level: T8

## 2017-03-03 NOTE — Anesthesia Procedure Notes (Signed)
Date/Time: 03/03/2017 10:07 AM Performed by: Claudia Desanctis Oxygen Delivery Method: Simple face mask

## 2017-03-04 DIAGNOSIS — Z888 Allergy status to other drugs, medicaments and biological substances status: Secondary | ICD-10-CM | POA: Diagnosis not present

## 2017-03-04 DIAGNOSIS — M1712 Unilateral primary osteoarthritis, left knee: Secondary | ICD-10-CM | POA: Diagnosis not present

## 2017-03-04 LAB — BASIC METABOLIC PANEL
Anion gap: 7 (ref 5–15)
BUN: 18 mg/dL (ref 6–20)
CO2: 23 mmol/L (ref 22–32)
Calcium: 8.6 mg/dL — ABNORMAL LOW (ref 8.9–10.3)
Chloride: 109 mmol/L (ref 101–111)
Creatinine, Ser: 1.02 mg/dL (ref 0.61–1.24)
GFR calc Af Amer: >60 mL/min (ref >60)
GFR calc non Af Amer: >60 mL/min (ref >60)
Glucose, Bld: 118 mg/dL — ABNORMAL HIGH (ref 65–99)
Potassium: 4.7 mmol/L (ref 3.5–5.1)
Sodium: 139 mmol/L (ref 135–145)

## 2017-03-04 LAB — CBC
HCT: 29.5 % — ABNORMAL LOW (ref 39.0–52.0)
Hemoglobin: 10.2 g/dL — ABNORMAL LOW (ref 13.0–17.0)
MCH: 32.3 pg (ref 26.0–34.0)
MCHC: 34.6 g/dL (ref 30.0–36.0)
MCV: 93.4 fL (ref 78.0–100.0)
Platelets: 220 10*3/uL (ref 150–400)
RBC: 3.16 MIL/uL — ABNORMAL LOW (ref 4.22–5.81)
RDW: 13.2 % (ref 11.5–15.5)
WBC: 12.2 10*3/uL — ABNORMAL HIGH (ref 4.0–10.5)

## 2017-03-04 MED ORDER — ONDANSETRON HCL 4 MG PO TABS: 4.0000 mg | ORAL_TABLET | Freq: Four times a day (QID) | ORAL | 0 refills | Status: DC | PRN

## 2017-03-04 NOTE — Progress Notes (Signed)
Physical Therapy Treatment Patient Details Name: Charles Norton MRN: [id number] DOB: 1941/02/10 Today's Date: 03/04/2017    History of Present Illness 76 yo male s/p L TKA 03/03/17    PT Comments    Progressing very well with mobility. Reviewed/practiced exercises, gait training, and stair training. Issued HEP for pt to perform at least 2x/day until he begins OP PT. All education completed. Ready to d/c from PT standpoint-made RN aware.     Follow Up Recommendations  DC plan and follow up therapy as arranged by surgeon (OPPT)     Equipment Recommendations  None recommended by PT    Recommendations for Other Services       Precautions / Restrictions Precautions Precautions: Fall;Knee Restrictions Weight Bearing Restrictions: No RLE Weight Bearing: Weight bearing as tolerated    Mobility  Bed Mobility Overal bed mobility: Modified Independent                Transfers Overall transfer level: Needs assistance Equipment used: Rolling walker (2 wheeled) Transfers: Sit to/from Stand Sit to Stand: Supervision         General transfer comment: for safety. VCs hand placement  Ambulation/Gait Ambulation/Gait assistance: Supervision Ambulation Distance (Feet): 175 Feet Assistive device: Rolling walker (2 wheeled) Gait Pattern/deviations: Step-through pattern;Step-to pattern;Decreased stride length     General Gait Details: for safety.    Stairs Stairs: Yes   Stair Management: Step to pattern;Forwards;One rail Left Number of Stairs: 2 General stair comments: VCS safety, technique, sequence. Pt used 2 hands on 1 rail technique. Close guard for safety.   Wheelchair Mobility    Modified Rankin (Stroke Patients Only)       Balance                                            Cognition Arousal/Alertness: Awake/alert Behavior During Therapy: WFL for tasks assessed/performed Overall Cognitive Status: Within Functional Limits for tasks  assessed                                        Exercises Total Joint Exercises Ankle Circles/Pumps: AROM;Both;10 reps;Supine Quad Sets: AROM;Both;10 reps;Supine Hip ABduction/ADduction: AROM;Left;10 reps;Supine Straight Leg Raises: Left;10 reps;Supine Knee Flexion: AROM;Left;Seated;10 reps Goniometric ROM: 0-95 degrees    General Comments        Pertinent Vitals/Pain Pain Assessment: 0-10 Pain Score: 4  Pain Location: L TKA Pain Descriptors / Indicators: Sore;Aching Pain Intervention(s): Limited activity within patient's tolerance;Repositioned;Ice applied (ice given to OT to apply)    Home Living                      Prior Function            PT Goals (current goals can now be found in the care plan section) Progress towards PT goals: Progressing toward goals    Frequency    7X/week      PT Plan Current plan remains appropriate    Co-evaluation              AM-PAC PT "6 Clicks" Daily Activity  Outcome Measure  Difficulty turning over in bed (including adjusting bedclothes, sheets and blankets)?: None Difficulty moving from lying on back to sitting on the side of the bed? : None Difficulty sitting down on  and standing up from a chair with arms (e.g., wheelchair, bedside commode, etc,.)?: None Help needed moving to and from a bed to chair (including a wheelchair)?: None Help needed walking in hospital room?: None Help needed climbing 3-5 steps with a railing? : A Little 6 Click Score: 23    End of Session Equipment Utilized During Treatment: Gait belt Activity Tolerance: Patient tolerated treatment well Patient left:  (with OT)         Time: 6294-7654 PT Time Calculation (min) (ACUTE ONLY): 15 min  Charges:  $Gait Training: 8-22 mins                    G Codes:          Weston Anna, MPT Pager: [phone]

## 2017-03-04 NOTE — Progress Notes (Signed)
     Subjective: 1 Day Post-Op Procedure(s) (LRB): LEFT TOTAL KNEE ARTHROPLASTY (Left)   Patient reports pain as mild, pain controlled.  He states that he is really not having much pain at all.  Discussed taking medications as needed, will give Rx for Zofran as he has a hx of nausea with taking analgesic medications. Ready to be discharged home.   Objective:   VITALS:   Vitals:   03/04/17 0045 03/04/17 0534  BP: (!) 110/51 126/69  Pulse: 81 73  Resp: 17 18  Temp: 98.6 F (37 C) 98 F (36.7 C)  SpO2: 97% 100%    Dorsiflexion/Plantar flexion intact Incision: dressing C/D/I No cellulitis present Compartment soft  LABS  Recent Labs  03/04/17 0552  HGB 10.2*  HCT 29.5*  WBC 12.2*  PLT 220     Recent Labs  03/04/17 0552  NA 139  K 4.7  BUN 18  CREATININE 1.02  GLUCOSE 118*     Assessment/Plan: 1 Day Post-Op Procedure(s) (LRB): LEFT TOTAL KNEE ARTHROPLASTY (Left) Foley cath d/c'ed Advance diet Up with therapy D/C IV fluids Discharge home Follow up in 2 weeks at [facility]. Follow up with OLIN,Aarian Cleaver D in 2 weeks.  Contact information:  [facility] [address], [address] [place] Le[place]   PAC  03/04/2017, 8:52 AM

## 2017-03-04 NOTE — Evaluation (Signed)
Occupational Therapy Evaluation Patient Details Name: DYLLEN MENNING MRN: [id number] DOB: 1940/09/22 Today's Date: 03/04/2017    History of Present Illness 76 yo male s/p L TKA 03/03/17   Clinical Impression   OT education complete.    Follow Up Recommendations  No OT follow up    Equipment Recommendations  None recommended by OT    Recommendations for Other Services       Precautions / Restrictions Precautions Precautions: Fall;Knee Restrictions Weight Bearing Restrictions: No RLE Weight Bearing: Weight bearing as tolerated      Mobility Bed Mobility Overal bed mobility: Modified Independent                Transfers Overall transfer level: Needs assistance Equipment used: Rolling walker (2 wheeled) Transfers: Sit to/from Stand Sit to Stand: Supervision         General transfer comment: for safety. VCs hand placement    Balance                                           ADL either performed or assessed with clinical judgement   ADL Overall ADL's : Needs assistance/impaired                                       General ADL Comments: pt overall S with ADL activity . Education provided regarding ADL activity s/p TKA.  Pt verbalized and demonstrated                  Pertinent Vitals/Pain Pain Assessment: 0-10 Pain Score: 3  Pain Location: L TKA Pain Descriptors / Indicators: Sore;Aching Pain Intervention(s): Premedicated before session;Ice applied;Repositioned;Limited activity within patient's tolerance     Hand Dominance     Extremity/Trunk Assessment Upper Extremity Assessment Upper Extremity Assessment: Overall WFL for tasks assessed           Communication Communication Communication: No difficulties   Cognition Arousal/Alertness: Awake/alert Behavior During Therapy: WFL for tasks assessed/performed Overall Cognitive Status: Within Functional Limits for tasks assessed                                      General Comments               Home Living Family/patient expects to be discharged to:: Private residence Living Arrangements: Spouse/significant other   Type of Home: House Home Access: Stairs to enter [employer] of Steps: 3 Entrance Stairs-Rails: Right Home Layout: One level     Bathroom Shower/Tub: Walk-in shower         Home Equipment: Environmental consultant - 2 wheels;Cane - single point          Prior Functioning/Environment Level of Independence: Independent                 OT Problem List:        OT Treatment/Interventions:      OT Goals(Current goals can be found in the care plan section) Acute Rehab OT Goals Patient Stated Goal: regain plof.  OT Frequency:     Barriers to D/C:               AM-PAC PT "6 Clicks" Daily Activity     Outcome Measure  Help from another person eating meals?: None Help from another person taking care of personal grooming?: None Help from another person toileting, which includes using toliet, bedpan, or urinal?: None Help from another person bathing (including washing, rinsing, drying)?: A Little Help from another person to put on and taking off regular upper body clothing?: None Help from another person to put on and taking off regular lower body clothing?: A Little 6 Click Score: 22   End of Session Equipment Utilized During Treatment: Rolling walker Nurse Communication: Mobility status  Activity Tolerance: Patient tolerated treatment well Patient left: in chair  OT Visit Diagnosis: Muscle weakness (generalized) (M62.81)                Time: 9747-1855 OT Time Calculation (min): 17 min Charges:  OT Evaluation $OT Eval Low Complexity: 1 Low G-Codes: OT G-codes **NOT FOR INPATIENT CLASS** Functional Assessment Tool Used: Clinical judgement Functional Limitation: Self care Self Care Current Status (M1586): At least 1 percent but less than 20 percent impaired, limited or  restricted Self Care Goal Status (W2574): 0 percent impaired, limited or restricted Self Care Discharge Status [phone]): At least 1 percent but less than 20 percent impaired, limited or restricted   Kari Baars, [place]  Payton Mccallum D 03/04/2017, 12:07 PM

## 2017-03-04 NOTE — Care Management Note (Signed)
Case Management Note  Patient Details  Name: Charles Norton MRN: [id number] Date of Birth: [birth year]  Subjective/Objective:   OA  Of the knee              Action/Plan: No with no needs. Pt states he have a walker at home. OP Rehab.   Expected Discharge Date:  03/04/17               Expected Discharge Plan:  OP Rehab  In-House Referral:     Discharge planning Services  CM Consult  Post Acute Care Choice:    Choice offered to:  Patient  DME Arranged:    DME Agency:     HH Arranged:    [place] Agency:     Status of Service:  Completed, signed off  If discussed at [employer] of Stay Meetings, dates discussed:    Additional Comments:  Meliya Mcconahy, RN 03/04/2017, 10:03 AM

## 2017-03-05 NOTE — Discharge Summary (Signed)
Physician Discharge Summary  Patient ID: Charles Norton MRN: [id number] DOB/AGE: [birth year] 74 y.o.  Admit date: 03/03/2017 Discharge date: 03/04/2017   Procedures:  Procedure(s) (LRB): LEFT TOTAL KNEE ARTHROPLASTY (Left)  Attending Physician:  Dr. Paralee Cancel   Admission Diagnoses:   Left knee primary OA / pain  Discharge Diagnoses:  Principal Problem:   S/P left TKA Active Problems:   S/P total knee replacement  Past Medical History:  Diagnosis Date  . Anemia   . Arthritis   . Iron deficiency    anemia  . PONV (postoperative nausea and vomiting)     HPI:    Charles Norton, 76 y.o. male, has a history of pain and functional disability in the left knee due to arthritis and has failed non-surgical conservative treatments for greater than 12 weeks to include NSAID's and/or analgesics and activity modification.  Onset of symptoms was gradual, starting 50+ years ago with gradually worsening course since that time. The patient noted prior procedures on the knee to include  arthroscopy and menisectomy on the left knee(s).  Patient currently rates pain in the left knee(s) at 4 out of 10 with activity. Patient has night pain, worsening of pain with activity and weight bearing, pain that interferes with activities of daily living, pain with passive range of motion, crepitus and joint swelling.  Patient has evidence of periarticular osteophytes and joint space narrowing by imaging studies.  There is no active infection.  Risks, benefits and expectations were discussed with the patient.  Risks including but not limited to the risk of anesthesia, blood clots, nerve damage, blood vessel damage, failure of the prosthesis, infection and up to and including death.  Patient understand the risks, benefits and expectations and wishes to proceed with surgery.   PCP: Carol Ada, MD   Discharged Condition: good  Hospital Course:  Patient underwent the above stated procedure on 03/03/2017. Patient  tolerated the procedure well and brought to the recovery room in good condition and subsequently to the floor.  POD #1 BP: 126/69 ; Pulse: 73 ; Temp: 98 F (36.7 C) ; Resp: 18 Patient reports pain as mild, pain controlled.  He states that he is really not having much pain at all.  Discussed taking medications as needed, will give Rx for Zofran as he has a hx of nausea with taking analgesic medications. Ready to be discharged home.  Dorsiflexion/plantar flexion intact, incision: dressing C/D/I, no cellulitis present and compartment soft.   LABS  Basename    HGB     10.2  HCT     29.5    Discharge Exam: General appearance: alert, cooperative and no distress Extremities: Homans sign is negative, no sign of DVT, no edema, redness or tenderness in the calves or thighs and no ulcers, gangrene or trophic changes  Disposition: Home with follow up in 2 weeks   Follow-up Information    Paralee Cancel, MD. Schedule an appointment as soon as possible for a visit in 2 week(s).   Specialty:  Orthopedic Surgery Contact information: [address] [place] [address] [phone]           Discharge Instructions    Call MD / Call 911    Complete by:  As directed    If you experience chest pain or shortness of breath, CALL 911 and be transported to the hospital emergency room.  If you develope a fever above 101 F, pus (white drainage) or increased drainage or redness at  the wound, or calf pain, call your surgeon's office.   Change dressing    Complete by:  As directed    Maintain surgical dressing until follow up in the clinic. If the edges start to pull up, may reinforce with tape. If the dressing is no longer working, may remove and cover with gauze and tape, but must keep the area dry and clean.  Call with any questions or concerns.   Constipation Prevention    Complete by:  As directed    Drink plenty of fluids.  Prune juice may be helpful.  You may use a stool  softener, such as Colace (over the counter) 100 mg twice a day.  Use MiraLax (over the counter) for constipation as needed.   Diet - low sodium heart healthy    Complete by:  As directed    Discharge instructions    Complete by:  As directed    Maintain surgical dressing until follow up in the clinic. If the edges start to pull up, may reinforce with tape. If the dressing is no longer working, may remove and cover with gauze and tape, but must keep the area dry and clean.  Follow up in 2 weeks at [facility]. Call with any questions or concerns.   Increase activity slowly as tolerated    Complete by:  As directed    Weight bearing as tolerated with assist device (walker, cane, etc) as directed, use it as long as suggested by your surgeon or therapist, typically at least 4-6 weeks.   TED hose    Complete by:  As directed    Use stockings (TED hose) for 2 weeks on both leg(s).  You may remove them at night for sleeping.      Allergies as of 03/04/2017      Reactions   Other Nausea And Vomiting   All pain medications.  Can tolerate if anti-nausea medication first      Medication List    STOP taking these medications   aspirin 81 MG tablet Replaced by:  aspirin 81 MG chewable tablet     TAKE these medications   aspirin 81 MG chewable tablet Commonly known as:  ASPIRIN CHILDRENS Chew 1 tablet (81 mg total) by mouth 2 (two) times daily. Take for 4 weeks, then resume regular dose. Replaces:  aspirin 81 MG tablet   docusate sodium 100 MG capsule Commonly known as:  COLACE Take 1 capsule (100 mg total) by mouth 2 (two) times daily.   ferrous sulfate 325 (65 FE) MG tablet Commonly known as:  FERROUSUL Take 1 tablet (325 mg total) by mouth 3 (three) times daily with meals.   GLUCOSAMINE CHONDR COMPLEX PO Take 1,500 mg by mouth daily.   HYDROcodone-acetaminophen 7.5-325 MG tablet Commonly known as:  NORCO Take 1-2 tablets by mouth every 4 (four) hours as needed for  moderate pain or severe pain.   ICAPS LUTEIN-ZEAXANTHIN PO Take 1 tablet by mouth daily.   magnesium oxide 400 MG tablet Commonly known as:  MAG-OX Take 400 mg by mouth daily.   methocarbamol 500 MG tablet Commonly known as:  ROBAXIN Take 1 tablet (500 mg total) by mouth every 6 (six) hours as needed for muscle spasms.   MULTI VITAMIN DAILY PO Take 1 tablet by mouth daily.   ondansetron 4 MG tablet Commonly known as:  ZOFRAN Take 1 tablet (4 mg total) by mouth every 6 (six) hours as needed for nausea.   polyethylene glycol packet Commonly  known as:  MIRALAX / GLYCOLAX Take 17 g by mouth 2 (two) times daily.   Potassium 99 MG Tabs Take 99 mg by mouth daily.   Turmeric 500 MG Caps Take 500 mg by mouth daily.            Discharge Care Instructions        Start     Ordered   03/04/17 0000  Change dressing    Comments:  Maintain surgical dressing until follow up in the clinic. If the edges start to pull up, may reinforce with tape. If the dressing is no longer working, may remove and cover with gauze and tape, but must keep the area dry and clean.  Call with any questions or concerns.   03/04/17 8088       Signed: [place] Pugh. Rushil Kimbrell   PA-C  03/05/2017, 9:45 AM

## 2017-03-06 DIAGNOSIS — M25662 Stiffness of left knee, not elsewhere classified: Secondary | ICD-10-CM | POA: Diagnosis not present

## 2017-03-09 DIAGNOSIS — M25662 Stiffness of left knee, not elsewhere classified: Secondary | ICD-10-CM | POA: Diagnosis not present

## 2017-03-11 DIAGNOSIS — M25662 Stiffness of left knee, not elsewhere classified: Secondary | ICD-10-CM | POA: Diagnosis not present

## 2017-03-13 DIAGNOSIS — M25662 Stiffness of left knee, not elsewhere classified: Secondary | ICD-10-CM | POA: Diagnosis not present

## 2017-03-17 DIAGNOSIS — M25662 Stiffness of left knee, not elsewhere classified: Secondary | ICD-10-CM | POA: Diagnosis not present

## 2017-03-20 DIAGNOSIS — M25662 Stiffness of left knee, not elsewhere classified: Secondary | ICD-10-CM | POA: Diagnosis not present

## 2017-03-24 DIAGNOSIS — M25662 Stiffness of left knee, not elsewhere classified: Secondary | ICD-10-CM | POA: Diagnosis not present

## 2017-03-26 DIAGNOSIS — I10 Essential (primary) hypertension: Secondary | ICD-10-CM | POA: Diagnosis not present

## 2017-03-27 DIAGNOSIS — M25662 Stiffness of left knee, not elsewhere classified: Secondary | ICD-10-CM | POA: Diagnosis not present

## 2017-03-31 DIAGNOSIS — M25662 Stiffness of left knee, not elsewhere classified: Secondary | ICD-10-CM | POA: Diagnosis not present

## 2017-04-01 DIAGNOSIS — D2272 Melanocytic nevi of left lower limb, including hip: Secondary | ICD-10-CM | POA: Diagnosis not present

## 2017-04-01 DIAGNOSIS — D2271 Melanocytic nevi of right lower limb, including hip: Secondary | ICD-10-CM | POA: Diagnosis not present

## 2017-04-01 DIAGNOSIS — L910 Hypertrophic scar: Secondary | ICD-10-CM | POA: Diagnosis not present

## 2017-04-01 DIAGNOSIS — L814 Other melanin hyperpigmentation: Secondary | ICD-10-CM | POA: Diagnosis not present

## 2017-04-01 DIAGNOSIS — L821 Other seborrheic keratosis: Secondary | ICD-10-CM | POA: Diagnosis not present

## 2017-04-01 DIAGNOSIS — B351 Tinea unguium: Secondary | ICD-10-CM | POA: Diagnosis not present

## 2017-04-01 DIAGNOSIS — L570 Actinic keratosis: Secondary | ICD-10-CM | POA: Diagnosis not present

## 2017-04-01 DIAGNOSIS — D485 Neoplasm of uncertain behavior of skin: Secondary | ICD-10-CM | POA: Diagnosis not present

## 2017-04-01 DIAGNOSIS — Z85828 Personal history of other malignant neoplasm of skin: Secondary | ICD-10-CM | POA: Diagnosis not present

## 2017-04-01 DIAGNOSIS — D1801 Hemangioma of skin and subcutaneous tissue: Secondary | ICD-10-CM | POA: Diagnosis not present

## 2017-04-03 DIAGNOSIS — M25662 Stiffness of left knee, not elsewhere classified: Secondary | ICD-10-CM | POA: Diagnosis not present

## 2017-04-07 DIAGNOSIS — M25662 Stiffness of left knee, not elsewhere classified: Secondary | ICD-10-CM | POA: Diagnosis not present

## 2017-04-08 DIAGNOSIS — Z96652 Presence of left artificial knee joint: Secondary | ICD-10-CM | POA: Diagnosis not present

## 2017-04-10 DIAGNOSIS — M25662 Stiffness of left knee, not elsewhere classified: Secondary | ICD-10-CM | POA: Diagnosis not present

## 2017-04-13 DIAGNOSIS — M25662 Stiffness of left knee, not elsewhere classified: Secondary | ICD-10-CM | POA: Diagnosis not present

## 2017-04-15 DIAGNOSIS — M25662 Stiffness of left knee, not elsewhere classified: Secondary | ICD-10-CM | POA: Diagnosis not present

## 2017-04-21 DIAGNOSIS — M25662 Stiffness of left knee, not elsewhere classified: Secondary | ICD-10-CM | POA: Diagnosis not present

## 2017-04-24 DIAGNOSIS — M25662 Stiffness of left knee, not elsewhere classified: Secondary | ICD-10-CM | POA: Diagnosis not present

## 2017-09-24 DIAGNOSIS — I10 Essential (primary) hypertension: Secondary | ICD-10-CM | POA: Diagnosis not present

## 2017-09-24 DIAGNOSIS — E785 Hyperlipidemia, unspecified: Secondary | ICD-10-CM | POA: Diagnosis not present

## 2017-12-08 DIAGNOSIS — R21 Rash and other nonspecific skin eruption: Secondary | ICD-10-CM | POA: Diagnosis not present

## 2017-12-16 DIAGNOSIS — Z1389 Encounter for screening for other disorder: Secondary | ICD-10-CM | POA: Diagnosis not present

## 2017-12-16 DIAGNOSIS — Z125 Encounter for screening for malignant neoplasm of prostate: Secondary | ICD-10-CM | POA: Diagnosis not present

## 2017-12-16 DIAGNOSIS — E785 Hyperlipidemia, unspecified: Secondary | ICD-10-CM | POA: Diagnosis not present

## 2017-12-16 DIAGNOSIS — Z8249 Family history of ischemic heart disease and other diseases of the circulatory system: Secondary | ICD-10-CM | POA: Diagnosis not present

## 2017-12-16 DIAGNOSIS — I10 Essential (primary) hypertension: Secondary | ICD-10-CM | POA: Diagnosis not present

## 2017-12-16 DIAGNOSIS — Z0000 Encounter for general adult medical examination without abnormal findings: Secondary | ICD-10-CM | POA: Diagnosis not present

## 2017-12-23 DIAGNOSIS — Z85828 Personal history of other malignant neoplasm of skin: Secondary | ICD-10-CM | POA: Diagnosis not present

## 2017-12-23 DIAGNOSIS — D2361 Other benign neoplasm of skin of right upper limb, including shoulder: Secondary | ICD-10-CM | POA: Diagnosis not present

## 2018-01-27 DIAGNOSIS — M25551 Pain in right hip: Secondary | ICD-10-CM | POA: Diagnosis not present

## 2018-01-27 DIAGNOSIS — M1712 Unilateral primary osteoarthritis, left knee: Secondary | ICD-10-CM | POA: Diagnosis not present

## 2018-04-07 DIAGNOSIS — Z23 Encounter for immunization: Secondary | ICD-10-CM | POA: Diagnosis not present

## 2018-05-04 DIAGNOSIS — H2511 Age-related nuclear cataract, right eye: Secondary | ICD-10-CM | POA: Diagnosis not present

## 2018-05-04 DIAGNOSIS — Z961 Presence of intraocular lens: Secondary | ICD-10-CM | POA: Diagnosis not present

## 2018-05-04 DIAGNOSIS — H17821 Peripheral opacity of cornea, right eye: Secondary | ICD-10-CM | POA: Diagnosis not present

## 2018-05-04 DIAGNOSIS — H40012 Open angle with borderline findings, low risk, left eye: Secondary | ICD-10-CM | POA: Diagnosis not present

## 2018-05-27 DIAGNOSIS — L814 Other melanin hyperpigmentation: Secondary | ICD-10-CM | POA: Diagnosis not present

## 2018-05-27 DIAGNOSIS — B351 Tinea unguium: Secondary | ICD-10-CM | POA: Diagnosis not present

## 2018-05-27 DIAGNOSIS — D1801 Hemangioma of skin and subcutaneous tissue: Secondary | ICD-10-CM | POA: Diagnosis not present

## 2018-05-27 DIAGNOSIS — L821 Other seborrheic keratosis: Secondary | ICD-10-CM | POA: Diagnosis not present

## 2018-05-27 DIAGNOSIS — Z85828 Personal history of other malignant neoplasm of skin: Secondary | ICD-10-CM | POA: Diagnosis not present

## 2018-05-27 DIAGNOSIS — L565 Disseminated superficial actinic porokeratosis (DSAP): Secondary | ICD-10-CM | POA: Diagnosis not present

## 2018-05-27 DIAGNOSIS — L570 Actinic keratosis: Secondary | ICD-10-CM | POA: Diagnosis not present

## 2018-05-27 DIAGNOSIS — D225 Melanocytic nevi of trunk: Secondary | ICD-10-CM | POA: Diagnosis not present

## 2018-05-27 DIAGNOSIS — D2272 Melanocytic nevi of left lower limb, including hip: Secondary | ICD-10-CM | POA: Diagnosis not present

## 2018-06-11 DIAGNOSIS — E785 Hyperlipidemia, unspecified: Secondary | ICD-10-CM | POA: Diagnosis not present

## 2018-06-11 DIAGNOSIS — I10 Essential (primary) hypertension: Secondary | ICD-10-CM | POA: Diagnosis not present

## 2018-11-06 ENCOUNTER — Encounter (HOSPITAL_COMMUNITY): Payer: Self-pay | Admitting: Emergency Medicine

## 2018-11-06 ENCOUNTER — Other Ambulatory Visit: Payer: Self-pay

## 2018-11-06 ENCOUNTER — Emergency Department (HOSPITAL_COMMUNITY): Payer: Medicare HMO

## 2018-11-06 ENCOUNTER — Emergency Department (HOSPITAL_COMMUNITY)
Admission: EM | Admit: 2018-11-06 | Discharge: 2018-11-07 | Disposition: A | Discharge status: Home / Self Care | Payer: Medicare HMO | Attending: Emergency Medicine | Admitting: Emergency Medicine

## 2018-11-06 DIAGNOSIS — Z79899 Other long term (current) drug therapy: Secondary | ICD-10-CM | POA: Diagnosis not present

## 2018-11-06 DIAGNOSIS — Z96652 Presence of left artificial knee joint: Secondary | ICD-10-CM | POA: Insufficient documentation

## 2018-11-06 DIAGNOSIS — R509 Fever, unspecified: Secondary | ICD-10-CM | POA: Diagnosis not present

## 2018-11-06 DIAGNOSIS — N3000 Acute cystitis without hematuria: Secondary | ICD-10-CM | POA: Diagnosis not present

## 2018-11-06 LAB — COMPREHENSIVE METABOLIC PANEL
ALT: 21 U/L (ref 0–44)
AST: 23 U/L (ref 15–41)
Albumin: 4.1 g/dL (ref 3.5–5.0)
Alkaline Phosphatase: 51 U/L (ref 38–126)
Anion gap: 10 (ref 5–15)
BUN: 16 mg/dL (ref 8–23)
CO2: 21 mmol/L — ABNORMAL LOW (ref 22–32)
Calcium: 9.4 mg/dL (ref 8.9–10.3)
Chloride: 108 mmol/L (ref 98–111)
Creatinine, Ser: 1.11 mg/dL (ref 0.61–1.24)
GFR calc Af Amer: >60 mL/min (ref >60)
GFR calc non Af Amer: >60 mL/min (ref >60)
Glucose, Bld: 94 mg/dL (ref 70–99)
Potassium: 4.2 mmol/L (ref 3.5–5.1)
Sodium: 139 mmol/L (ref 135–145)
Total Bilirubin: 0.7 mg/dL (ref 0.3–1.2)
Total Protein: 6.3 g/dL — ABNORMAL LOW (ref 6.5–8.1)

## 2018-11-06 LAB — CBC WITH DIFFERENTIAL/PLATELET
Abs Immature Granulocytes: 0.04 10*3/uL (ref 0.00–0.07)
Basophils Absolute: 0 10*3/uL (ref 0.0–0.1)
Basophils Relative: 0 %
Eosinophils Absolute: 0.1 10*3/uL (ref 0.0–0.5)
Eosinophils Relative: 1 %
HCT: 38 % — ABNORMAL LOW (ref 39.0–52.0)
Hemoglobin: 13 g/dL (ref 13.0–17.0)
Immature Granulocytes: 0 %
Lymphocytes Relative: 4 %
Lymphs Abs: 0.5 10*3/uL — ABNORMAL LOW (ref 0.7–4.0)
MCH: 32.3 pg (ref 26.0–34.0)
MCHC: 34.2 g/dL (ref 30.0–36.0)
MCV: 94.5 fL (ref 80.0–100.0)
Monocytes Absolute: 0.7 10*3/uL (ref 0.1–1.0)
Monocytes Relative: 6 %
Neutro Abs: 10.5 10*3/uL — ABNORMAL HIGH (ref 1.7–7.7)
Neutrophils Relative %: 89 %
Platelets: 191 10*3/uL (ref 150–400)
RBC: 4.02 MIL/uL — ABNORMAL LOW (ref 4.22–5.81)
RDW: 13.1 % (ref 11.5–15.5)
WBC: 11.9 10*3/uL — ABNORMAL HIGH (ref 4.0–10.5)
nRBC: 0 % (ref 0.0–0.2)

## 2018-11-06 LAB — LACTIC ACID, PLASMA: Lactic Acid, Venous: 1 mmol/L (ref 0.5–1.9)

## 2018-11-06 MED ORDER — ACETAMINOPHEN 325 MG PO TABS
650.0000 mg | ORAL_TABLET | Freq: Once | ORAL | Status: AC
  Administered 2018-11-06: 650 mg via ORAL
  Filled 2018-11-06: qty 2

## 2018-11-06 NOTE — ED Notes (Signed)
Pt primary contact is his spouse (helen) and has been updated in  contacts

## 2018-11-06 NOTE — ED Triage Notes (Signed)
Patient reports fever with chills onset this evening , denies cough or SOB . No sick contact with family or friends .

## 2018-11-07 LAB — URINALYSIS, ROUTINE W REFLEX MICROSCOPIC
Bilirubin Urine: NEGATIVE
Glucose, UA: NEGATIVE mg/dL
Hgb urine dipstick: NEGATIVE
Ketones, ur: 5 mg/dL — AB
Nitrite: POSITIVE — AB
Protein, ur: NEGATIVE mg/dL
Specific Gravity, Urine: 1.017 (ref 1.005–1.030)
WBC, UA: >50 WBC/hpf — ABNORMAL HIGH (ref 0–5)
pH: 5 (ref 5.0–8.0)

## 2018-11-07 MED ORDER — CEPHALEXIN 250 MG PO CAPS
500.0000 mg | ORAL_CAPSULE | Freq: Once | ORAL | Status: AC
  Administered 2018-11-07: 500 mg via ORAL
  Filled 2018-11-07: qty 2

## 2018-11-07 MED ORDER — CEPHALEXIN 500 MG PO CAPS: 500.0000 mg | ORAL_CAPSULE | Freq: Three times a day (TID) | ORAL | 0 refills | Status: AC

## 2018-11-07 NOTE — ED Provider Notes (Signed)
[facility] EMERGENCY DEPARTMENT Provider Note  CSN: [id number] Arrival date & time: 11/06/18 2114  Chief Complaint(s) Fever  HPI Charles Norton is a 78 y.o. male with a past medical history listed below presents to the emergency department with 6 hours of fevers and chills with myalgias.  Patient reported noticing a temperature of 101 degrees.  He endorses improvement after receiving Tylenol upon arrival.  He denies URI symptoms, cough, nausea, vomiting, abdominal pain, diarrhea.  He did report having some mild dysuria earlier today.  Denies any prior history of urinary tract infections.  Denies any known sick contacts.  No known COVID exposure.  HPI   Past Medical History Past Medical History:  Diagnosis Date  . Anemia   . Arthritis   . Iron deficiency    anemia  . PONV (postoperative nausea and vomiting)    Patient Active Problem List   Diagnosis Date Noted  . 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 Abnormal EKG 02/26/2017  . Dyslipidemia 02/26/2017  . Elevated blood pressure reading in office without diagnosis of hypertension 02/26/2017  . Preoperative cardiovascular examination 02/26/2017  . HERPES SIMPLEX INFECTION, RECURRENT 08/26/2008  . ANEMIA, IRON DEFICIENCY 08/26/2008  . DIVERTICULOSIS, COLON, HX OF 08/26/2008  . INGUINAL HERNIORRHAPHY, HX OF 07/12/2004   Home Medication(s) Prior to Admission medications   Medication Sig Start Date End Date Taking? Authorizing Provider  cephALEXin (KEFLEX) 500 MG capsule Take 1 capsule (500 mg total) by mouth 3 (three) times daily for 7 days. 11/07/18 11/14/18  Fatima Blank, MD  docusate sodium (COLACE) 100 MG capsule Take 1 capsule (100 mg total) by mouth 2 (two) times daily. 03/03/17   Danae Orleans, PA-C  ferrous sulfate (FERROUSUL) 325 (65 FE) MG tablet Take 1 tablet (325 mg total) by mouth 3 (three) times daily with meals. 03/03/17   Danae Orleans, PA-C   Glucosamine-Chondroitin (GLUCOSAMINE CHONDR COMPLEX PO) Take 1,500 mg by mouth daily.     [provider]  HYDROcodone-acetaminophen (NORCO) 7.5-325 MG tablet Take 1-2 tablets by mouth every 4 (four) hours as needed for moderate pain or severe pain. 03/03/17   Danae Orleans, PA-C  magnesium oxide (MAG-OX) 400 MG tablet Take 400 mg by mouth daily.    [provider]  methocarbamol (ROBAXIN) 500 MG tablet Take 1 tablet (500 mg total) by mouth every 6 (six) hours as needed for muscle spasms. 03/03/17   Danae Orleans, PA-C  Multiple Vitamin (MULTI VITAMIN DAILY PO) Take 1 tablet by mouth daily.     [provider]  ondansetron (ZOFRAN) 4 MG tablet Take 1 tablet (4 mg total) by mouth every 6 (six) hours as needed for nausea. 03/04/17   Danae Orleans, PA-C  polyethylene glycol (MIRALAX / GLYCOLAX) packet Take 17 g by mouth 2 (two) times daily. 03/03/17   Danae Orleans, PA-C  Potassium 99 MG TABS Take 99 mg by mouth daily.     [provider]  Specialty Vitamins Products (ICAPS LUTEIN-ZEAXANTHIN PO) Take 1 tablet by mouth daily.    [provider]  Turmeric 500 MG CAPS Take 500 mg by mouth daily.    [provider]  Past Surgical History Past Surgical History:  Procedure Laterality Date  . Hernia bilateral    . KNEE SURGERY     arthrosocopy   . removal of cyst     sphenoid cyst   . ROTATOR CUFF REPAIR Bilateral   . 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   Procedure: LEFT TOTAL KNEE ARTHROPLASTY;  Surgeon: Paralee Cancel, MD;  Location: [facility] ORS;  Service: Orthopedics;  Laterality: Left;  70 mins with block   Family History Family History  Problem Relation Age of Onset  . Tuberculosis Mother   . Hypertension Father   . Heart disease Father   . Obesity Brother     Social History Social History   Tobacco  Use  . Smoking status: Never Smoker  . Smokeless tobacco: Never Used  Substance Use Topics  . Alcohol use: Yes    Comment: 15 dinks per week   . Drug use: No   Allergies Other  Review of Systems Review of Systems All other systems are reviewed and are negative for acute change except as noted in the HPI  Physical Exam Vital Signs  I have reviewed the triage vital signs BP 123/73   Pulse (!) 101   Temp 98.4 F (36.9 C)   Resp 18   SpO2 97%   Physical Exam Vitals signs reviewed.  Constitutional:      General: He is not in acute distress.    Appearance: He is well-developed. He is not diaphoretic.  HENT:     Head: Normocephalic and atraumatic.     Nose: Nose normal.  Eyes:     General: No scleral icterus.       Right eye: No discharge.        Left eye: No discharge.     Conjunctiva/sclera: Conjunctivae normal.     Pupils: Pupils are equal, round, and reactive to light.  Neck:     Musculoskeletal: Normal range of motion and neck supple.  Cardiovascular:     Rate and Rhythm: Normal rate and regular rhythm.     Heart sounds: No murmur. No friction rub. No gallop.   Pulmonary:     Effort: Pulmonary effort is normal. No respiratory distress.     Breath sounds: Normal breath sounds. No stridor. No rales.  Abdominal:     General: There is no distension.     Palpations: Abdomen is soft.     Tenderness: There is no abdominal tenderness.  Musculoskeletal:        General: No tenderness.  Skin:    General: Skin is warm and dry.     Findings: No erythema or rash.  Neurological:     Mental Status: He is alert and oriented to person, place, and time.     ED Results and Treatments Labs (all labs ordered are listed, but only abnormal results are displayed) Labs Reviewed  CBC WITH DIFFERENTIAL/PLATELET - Abnormal; Notable for the following components:      Result Value   WBC 11.9 (*)    RBC 4.02 (*)    HCT 38.0 (*)    Neutro Abs 10.5 (*)    Lymphs Abs 0.5 (*)    All  other components within normal limits  COMPREHENSIVE METABOLIC PANEL - Abnormal; Notable for the following components:   CO2 21 (*)    Total Protein 6.3 (*)    All other components within normal limits  URINALYSIS, ROUTINE W REFLEX MICROSCOPIC - Abnormal; Notable for the following components:   APPearance HAZY (*)  Ketones, ur 5 (*)    Nitrite POSITIVE (*)    Leukocytes,Ua MODERATE (*)    WBC, UA >50 (*)    Bacteria, UA RARE (*)    All other components within normal limits  URINE CULTURE  LACTIC ACID, PLASMA  LACTIC ACID, PLASMA                                                                                                                         EKG  EKG Interpretation  Date/Time:    Ventricular Rate:    PR Interval:    QRS Duration:   QT Interval:    QTC Calculation:   R Axis:     Text Interpretation:        Radiology Dg Chest 2 View  Result Date: 11/06/2018 CLINICAL DATA:  Fever and chills. EXAM: CHEST - 2 VIEW COMPARISON:  07/10/2004 FINDINGS: The lungs are somewhat hyperexpanded. There is no focal area of consolidation. No large pleural effusion. The heart size is normal. There is no acute osseous abnormality. IMPRESSION: No active cardiopulmonary disease. Electronically Signed   By: Constance Holster M.D.   On: 11/06/2018 22:41   Pertinent labs & imaging results that were available during my care of the patient were reviewed by me and considered in my medical decision making (see chart for details).  Medications Ordered in ED Medications  acetaminophen (TYLENOL) tablet 650 mg (650 mg Oral Given 11/06/18 2212)  cephALEXin (KEFLEX) capsule 500 mg (500 mg Oral Given 11/07/18 3546)                                                                                                                                    Procedures Procedures  (including critical care time)  Medical Decision Making / ED Course I have reviewed the nursing notes for this encounter and the  patient's prior records (if available in EHR or on provided paperwork).    Patient noted to be febrile upon arrival.  Defervesced after Tylenol.  He is well-appearing and well-hydrated, nontoxic.  Labs notable for mild leukocytosis.  Source appears to be urinary in nature.  Chest x-ray was negative.  He does not have any URI symptoms concerning for respiratory viral infection such as COVID.  Patient is not septic at this time and appropriate for outpatient management.  Given first dose of antibiotic in the emergency department.  Urine sent for culture.  The patient appears reasonably screened and/or stabilized for discharge and I doubt any other medical condition or other [facility] requiring further screening, evaluation, or treatment in the ED at this time prior to discharge. The patient is safe for discharge with strict return precautions.     Final Clinical Impression(s) / ED Diagnoses Final diagnoses:  Acute cystitis without hematuria   Disposition: Discharge  Condition: Good  I have discussed the results, Dx and Tx plan with the patient who expressed understanding and agree(s) with the plan. Discharge instructions discussed at great length. The patient was given strict return precautions who verbalized understanding of the instructions. No further questions at time of discharge.    ED Discharge Orders         Ordered    cephALEXin (KEFLEX) 500 MG capsule  3 times daily     11/07/18 0200           Follow Up: Carol Ada, [place] [address] [phone]  Schedule an appointment as soon as possible for a visit  in 5-7 days to ensure appropriate treatment      This chart was dictated using voice recognition software.  Despite best efforts to proofread,  errors can occur which can change the documentation meaning.   Fatima Blank, MD 11/07/18 0200

## 2018-11-09 DIAGNOSIS — R399 Unspecified symptoms and signs involving the genitourinary system: Secondary | ICD-10-CM | POA: Diagnosis not present

## 2018-11-09 LAB — URINE CULTURE: Culture: >=100000 — AB

## 2018-11-10 ENCOUNTER — Telehealth: Payer: Self-pay | Admitting: Emergency Medicine

## 2018-11-10 NOTE — Telephone Encounter (Signed)
Post ED Visit - Positive Culture Follow-up  Culture report reviewed by antimicrobial stewardship pharmacist: [place] Team []  Elenor Quinones, Pharm.D. []  Heide Guile, Pharm.D., BCPS AQ-ID []  Parks Neptune, Pharm.D., BCPS []  Alycia Rossetti, Pharm.D., BCPS []  [place], [place].D., BCPS, AAHIVP []  Legrand Como, Pharm.D., BCPS, AAHIVP []  Salome Arnt, PharmD, BCPS []  Johnnette Gourd, PharmD, BCPS []  Hughes Better, PharmD, BCPS []  Leeroy Cha, PharmD []  Laqueta Linden, PharmD, BCPS []  Albertina Parr, PharmD Elicia Lamp PharmD  [place] Team []  Leodis Sias, PharmD []  Lindell Spar, PharmD []  Royetta Asal, PharmD []  Graylin Shiver, Rph []  Rema Fendt) Glennon Mac, PharmD []  Arlyn Dunning, PharmD []  Netta Cedars, PharmD []  Dia Sitter, PharmD []  Leone Haven, PharmD []  Gretta Arab, PharmD []  Theodis Shove, PharmD []  Peggyann Juba, PharmD []  Reuel Boom, PharmD   Positive urine culture Treated with cephalexin,organism sensitive to the same and no further patient follow-up is required at this time.  Hazle Nordmann 11/10/2018, 1:27 PM

## 2018-11-17 DIAGNOSIS — Z125 Encounter for screening for malignant neoplasm of prostate: Secondary | ICD-10-CM | POA: Diagnosis not present

## 2018-11-17 DIAGNOSIS — R748 Abnormal levels of other serum enzymes: Secondary | ICD-10-CM | POA: Diagnosis not present

## 2018-11-17 DIAGNOSIS — R61 Generalized hyperhidrosis: Secondary | ICD-10-CM | POA: Diagnosis not present

## 2018-11-17 DIAGNOSIS — R399 Unspecified symptoms and signs involving the genitourinary system: Secondary | ICD-10-CM | POA: Diagnosis not present

## 2018-11-18 DIAGNOSIS — R351 Nocturia: Secondary | ICD-10-CM | POA: Diagnosis not present

## 2018-11-18 DIAGNOSIS — R350 Frequency of micturition: Secondary | ICD-10-CM | POA: Diagnosis not present

## 2018-11-18 DIAGNOSIS — R740 Nonspecific elevation of levels of transaminase and lactic acid dehydrogenase [LDH]: Secondary | ICD-10-CM | POA: Diagnosis not present

## 2018-11-18 DIAGNOSIS — R61 Generalized hyperhidrosis: Secondary | ICD-10-CM | POA: Diagnosis not present

## 2018-11-24 DIAGNOSIS — N410 Acute prostatitis: Secondary | ICD-10-CM | POA: Diagnosis not present

## 2018-11-25 DIAGNOSIS — R740 Nonspecific elevation of levels of transaminase and lactic acid dehydrogenase [LDH]: Secondary | ICD-10-CM | POA: Diagnosis not present

## 2018-12-15 DIAGNOSIS — Z862 Personal history of diseases of the blood and blood-forming organs and certain disorders involving the immune mechanism: Secondary | ICD-10-CM | POA: Diagnosis not present

## 2018-12-15 DIAGNOSIS — E785 Hyperlipidemia, unspecified: Secondary | ICD-10-CM | POA: Diagnosis not present

## 2018-12-15 DIAGNOSIS — I10 Essential (primary) hypertension: Secondary | ICD-10-CM | POA: Diagnosis not present

## 2018-12-21 DIAGNOSIS — Z1389 Encounter for screening for other disorder: Secondary | ICD-10-CM | POA: Diagnosis not present

## 2018-12-21 DIAGNOSIS — E785 Hyperlipidemia, unspecified: Secondary | ICD-10-CM | POA: Diagnosis not present

## 2018-12-21 DIAGNOSIS — Z0000 Encounter for general adult medical examination without abnormal findings: Secondary | ICD-10-CM | POA: Diagnosis not present

## 2018-12-21 DIAGNOSIS — R972 Elevated prostate specific antigen [PSA]: Secondary | ICD-10-CM | POA: Diagnosis not present

## 2018-12-21 DIAGNOSIS — Z862 Personal history of diseases of the blood and blood-forming organs and certain disorders involving the immune mechanism: Secondary | ICD-10-CM | POA: Diagnosis not present

## 2018-12-21 DIAGNOSIS — N410 Acute prostatitis: Secondary | ICD-10-CM | POA: Diagnosis not present

## 2018-12-21 DIAGNOSIS — D049 Carcinoma in situ of skin, unspecified: Secondary | ICD-10-CM | POA: Diagnosis not present

## 2018-12-21 DIAGNOSIS — I10 Essential (primary) hypertension: Secondary | ICD-10-CM | POA: Diagnosis not present

## 2018-12-24 DIAGNOSIS — Z1211 Encounter for screening for malignant neoplasm of colon: Secondary | ICD-10-CM | POA: Diagnosis not present

## 2018-12-24 DIAGNOSIS — R772 Abnormality of alphafetoprotein: Secondary | ICD-10-CM | POA: Diagnosis not present

## 2018-12-24 DIAGNOSIS — R740 Nonspecific elevation of levels of transaminase and lactic acid dehydrogenase [LDH]: Secondary | ICD-10-CM | POA: Diagnosis not present

## 2019-01-05 DIAGNOSIS — N410 Acute prostatitis: Secondary | ICD-10-CM | POA: Diagnosis not present

## 2019-01-11 DIAGNOSIS — N138 Other obstructive and reflux uropathy: Secondary | ICD-10-CM | POA: Diagnosis not present

## 2019-01-11 DIAGNOSIS — N401 Enlarged prostate with lower urinary tract symptoms: Secondary | ICD-10-CM | POA: Diagnosis not present

## 2019-01-27 DIAGNOSIS — K5730 Diverticulosis of large intestine without perforation or abscess without bleeding: Secondary | ICD-10-CM | POA: Diagnosis not present

## 2019-01-27 DIAGNOSIS — K648 Other hemorrhoids: Secondary | ICD-10-CM | POA: Diagnosis not present

## 2019-01-27 DIAGNOSIS — K626 Ulcer of anus and rectum: Secondary | ICD-10-CM | POA: Diagnosis not present

## 2019-01-27 DIAGNOSIS — K6289 Other specified diseases of anus and rectum: Secondary | ICD-10-CM | POA: Diagnosis not present

## 2019-01-27 DIAGNOSIS — D122 Benign neoplasm of ascending colon: Secondary | ICD-10-CM | POA: Diagnosis not present

## 2019-01-27 DIAGNOSIS — Z1211 Encounter for screening for malignant neoplasm of colon: Secondary | ICD-10-CM | POA: Diagnosis not present

## 2019-02-01 DIAGNOSIS — D122 Benign neoplasm of ascending colon: Secondary | ICD-10-CM | POA: Diagnosis not present

## 2019-03-10 DIAGNOSIS — Z23 Encounter for immunization: Secondary | ICD-10-CM | POA: Diagnosis not present

## 2019-04-15 DIAGNOSIS — M1990 Unspecified osteoarthritis, unspecified site: Secondary | ICD-10-CM | POA: Diagnosis not present

## 2019-04-15 DIAGNOSIS — I10 Essential (primary) hypertension: Secondary | ICD-10-CM | POA: Diagnosis not present

## 2019-04-15 DIAGNOSIS — E785 Hyperlipidemia, unspecified: Secondary | ICD-10-CM | POA: Diagnosis not present

## 2019-07-06 DIAGNOSIS — M1990 Unspecified osteoarthritis, unspecified site: Secondary | ICD-10-CM | POA: Diagnosis not present

## 2019-07-06 DIAGNOSIS — E785 Hyperlipidemia, unspecified: Secondary | ICD-10-CM | POA: Diagnosis not present

## 2019-07-06 DIAGNOSIS — I10 Essential (primary) hypertension: Secondary | ICD-10-CM | POA: Diagnosis not present

## 2019-08-17 DIAGNOSIS — L308 Other specified dermatitis: Secondary | ICD-10-CM | POA: Diagnosis not present

## 2019-08-17 DIAGNOSIS — B351 Tinea unguium: Secondary | ICD-10-CM | POA: Diagnosis not present

## 2019-08-17 DIAGNOSIS — Z85828 Personal history of other malignant neoplasm of skin: Secondary | ICD-10-CM | POA: Diagnosis not present

## 2019-08-17 DIAGNOSIS — B353 Tinea pedis: Secondary | ICD-10-CM | POA: Diagnosis not present

## 2019-09-29 DIAGNOSIS — Z0120 Encounter for dental examination and cleaning without abnormal findings: Secondary | ICD-10-CM | POA: Diagnosis not present

## 2019-12-15 DIAGNOSIS — Z862 Personal history of diseases of the blood and blood-forming organs and certain disorders involving the immune mechanism: Secondary | ICD-10-CM | POA: Diagnosis not present

## 2019-12-15 DIAGNOSIS — Z0000 Encounter for general adult medical examination without abnormal findings: Secondary | ICD-10-CM | POA: Diagnosis not present

## 2019-12-15 DIAGNOSIS — E785 Hyperlipidemia, unspecified: Secondary | ICD-10-CM | POA: Diagnosis not present

## 2019-12-15 DIAGNOSIS — I10 Essential (primary) hypertension: Secondary | ICD-10-CM | POA: Diagnosis not present

## 2019-12-23 DIAGNOSIS — Z0000 Encounter for general adult medical examination without abnormal findings: Secondary | ICD-10-CM | POA: Diagnosis not present

## 2019-12-23 DIAGNOSIS — E785 Hyperlipidemia, unspecified: Secondary | ICD-10-CM | POA: Diagnosis not present

## 2019-12-23 DIAGNOSIS — I10 Essential (primary) hypertension: Secondary | ICD-10-CM | POA: Diagnosis not present

## 2019-12-23 DIAGNOSIS — Z1389 Encounter for screening for other disorder: Secondary | ICD-10-CM | POA: Diagnosis not present

## 2020-01-13 DIAGNOSIS — Z20822 Contact with and (suspected) exposure to covid-19: Secondary | ICD-10-CM | POA: Diagnosis not present

## 2020-03-10 IMAGING — CR CHEST - 2 VIEW
2 series · 2 of 2 positions shown · non-contrast
Comparison: 07/10/2004

CLINICAL DATA: Fever and chills.

EXAM:
CHEST - 2 VIEW

[chest pa]
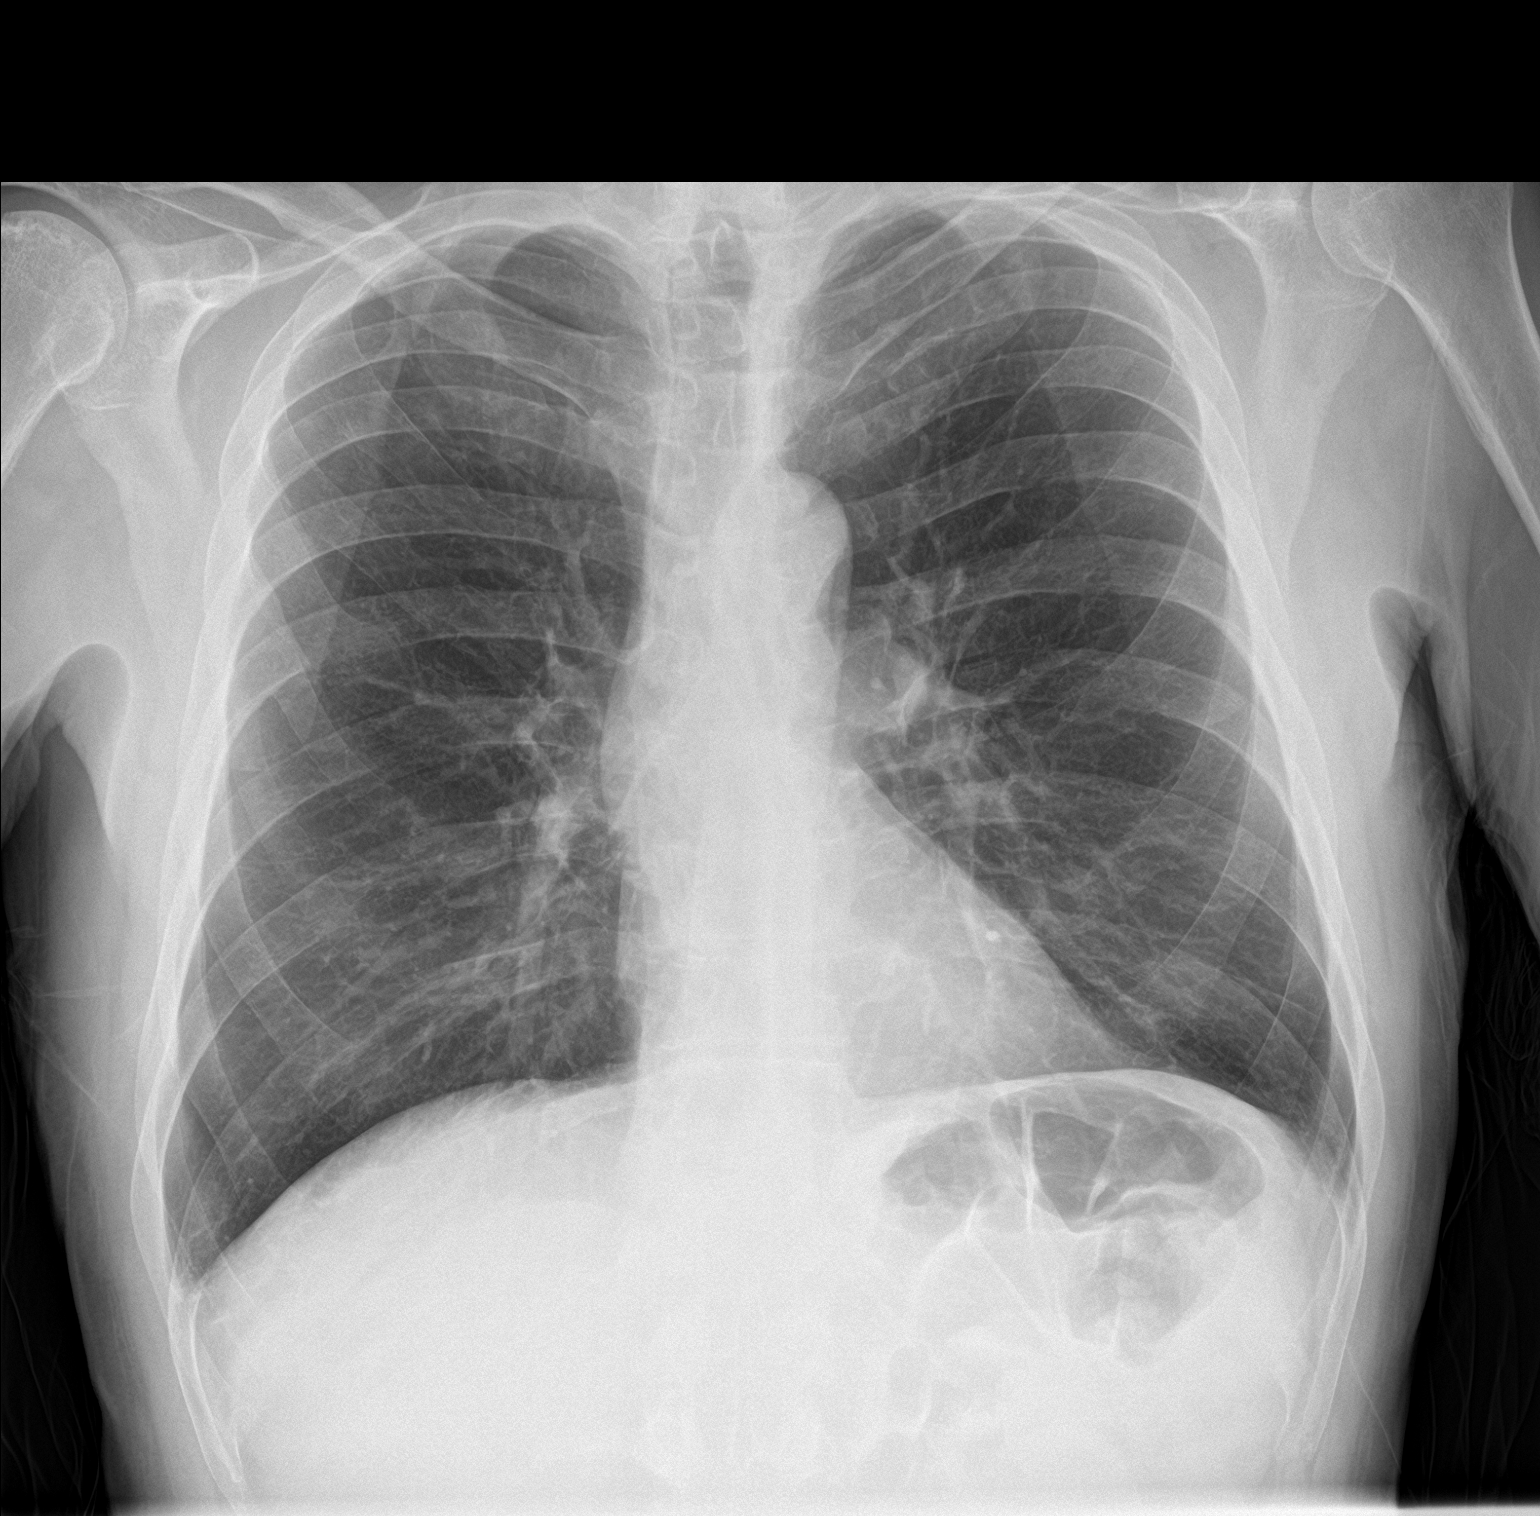

[chest lat]
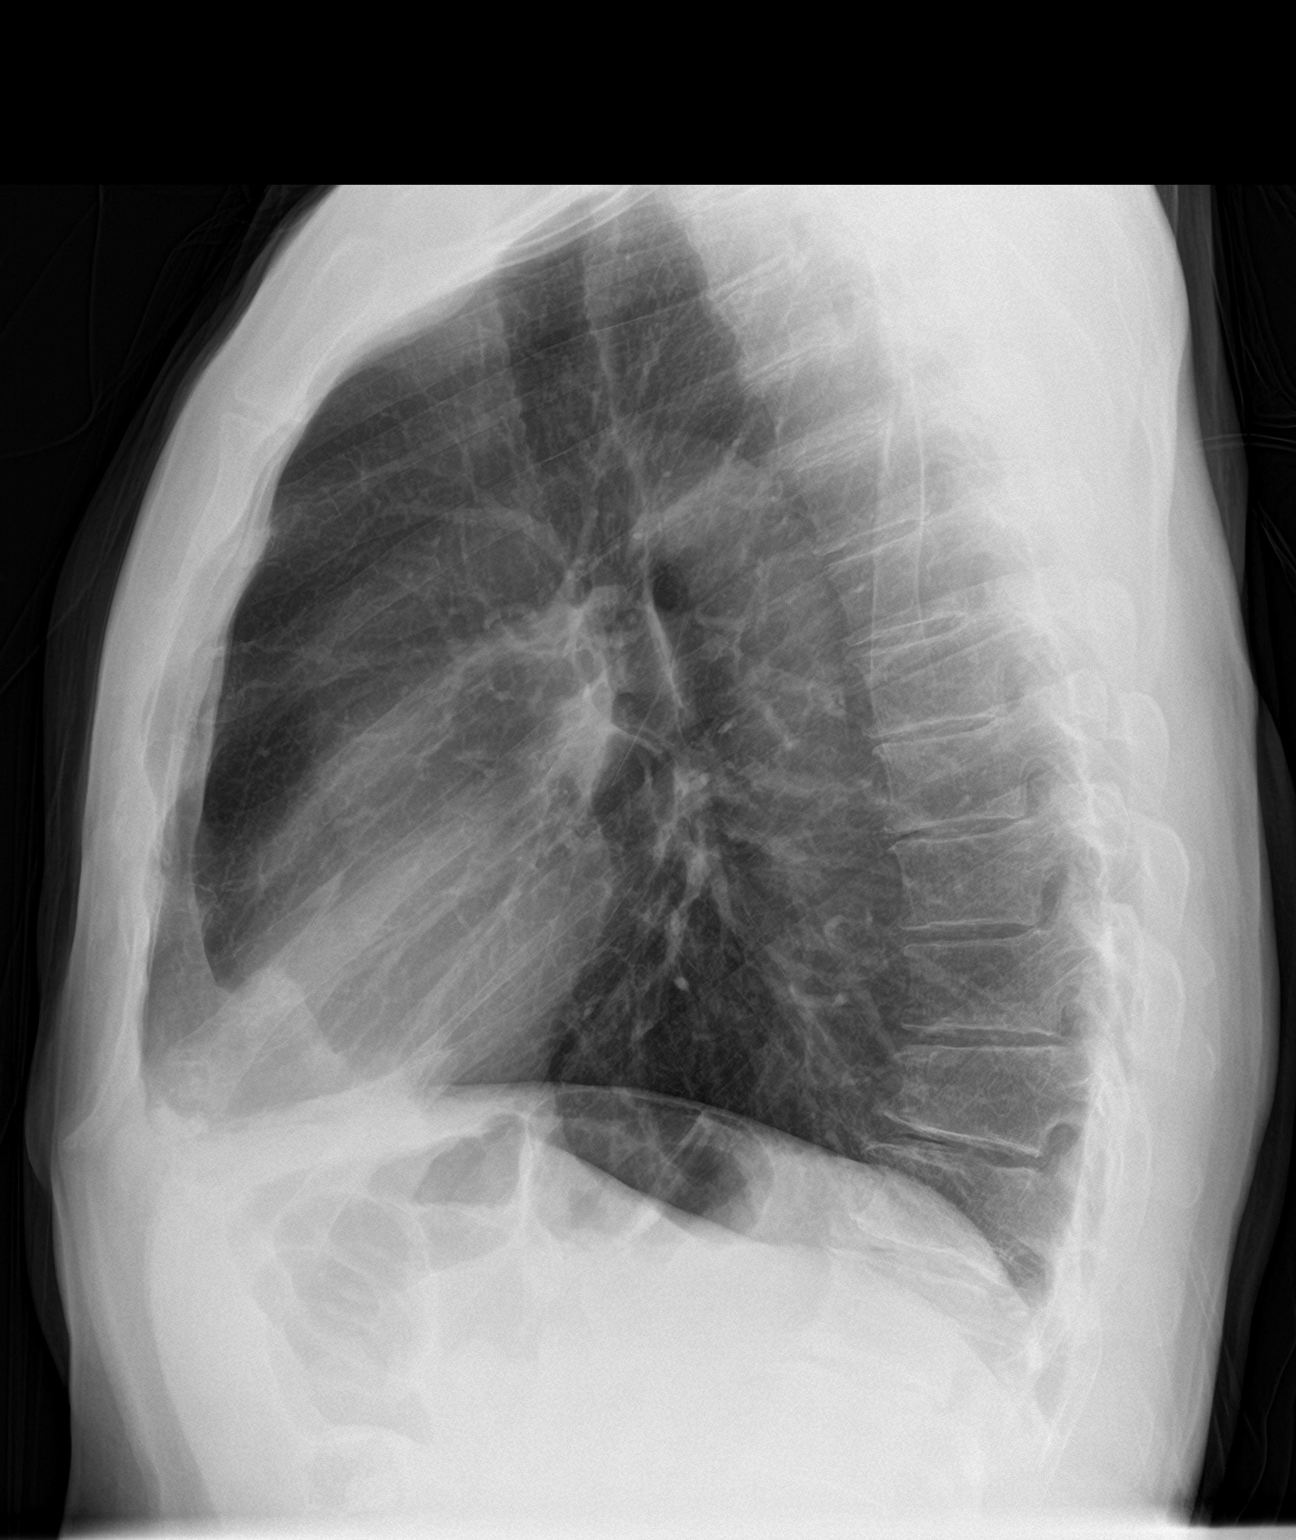

[2 of 2 positions shown; findings below may reference images not displayed]

FINDINGS: The lungs are somewhat hyperexpanded. There is no focal area of
consolidation. No large pleural effusion. The heart size is normal.
There is no acute osseous abnormality.
IMPRESSION: No active cardiopulmonary disease.

## 2020-03-19 ENCOUNTER — Encounter: Payer: Self-pay | Admitting: Internal Medicine

## 2020-05-03 ENCOUNTER — Encounter: Payer: Self-pay | Admitting: Internal Medicine

## 2020-05-03 ENCOUNTER — Ambulatory Visit: Payer: Medicare Other | Admitting: Internal Medicine

## 2020-05-03 VITALS — BP 108/60 | HR 76 | Ht 68.25 in | Wt 158.5 lb

## 2020-05-03 DIAGNOSIS — K5909 Other constipation: Secondary | ICD-10-CM

## 2020-05-03 DIAGNOSIS — K623 Rectal prolapse: Secondary | ICD-10-CM

## 2020-05-03 DIAGNOSIS — R198 Other specified symptoms and signs involving the digestive system and abdomen: Secondary | ICD-10-CM

## 2020-05-03 NOTE — Progress Notes (Signed)
Charles Norton 79 y.o. 1940-09-28 [id number]  Assessment & Plan:   Encounter Diagnoses  Name Primary?  . Rectal prolapse ? Yes  . Chronic constipation   . Abnormal defecation    Based upon the history it sounds like he has rectal prolapse though I have not seen that.  He has pathologic changes of rectal prolapse injury though so it seems pretty likely.   We talked about various treatments including surgery.  It seems to me we should try conservative measures first.  He has disordered defecation.  I think he would greatly benefit from pelvic floor physical therapy to see if we could improve his bowel habits and reduce the frequency and hopefully eliminate the issues with prolapse.  He is going to [facility] for 2 months so he will not be able to do the physical therapy until March.  I think obtaining a squatty potty is reasonable.  I want him to try a high-fiber diet with plenty of fluids and also add 2 tablespoons of Benefiber daily.  They asked what would happen if he had prolapse that he could not reduce when in [place] and I explained that he would have to go to the emergency room.   I plan to see him back to reassess him after he is participated in pelvic floor therapy and sooner as needed otherwise.  I appreciate the opportunity to care for this patient. Copy will be sent to Marilynne Drivers, [facility]  Subjective:   Chief Complaint: Rectal prolapse  HPI The patient is a 79 year old white man here with his wife because of complaints of a 20-year history or so of rectal prolapse.  Past medical history significant for history of adenomatous colon polyps and benign prostatic hypertrophy.  He had seen Dr. Therisa Doyne of [facility] and had a colonoscopy last year.  That exam revealed an ascending tubular adenoma and biopsies of inflamed appearing rectum showing reactive regenerative epithelium negative for dysplasia.  The differential diagnosis  considerations included prolapse related injury, stercoral injury ischemia and drug injury.  He describes a history of intermittent rectal prolapse really have to reduce.  For many years he was constipated and strained and had extreme difficulty defecating only got about once a week.  At around the age of 86 he says he trained himself to defecate images go to the bathroom and wait up to 20 minutes or so oftentimes strain.  Over this time.  He developed a problem with what was prolapse of tissue that he would have to reduce manually.  He tries to limit toilet time to only 5 minutes now so if he cannot defecate he will get up and move on.  That way the prolapse symptoms are much less frequent.  He had a colonoscopy in 2010 by Dr. Amedeo Plenty that demonstrated the same type of rectal changes on endoscopic view and biopsy.  He also has diverticulosis he had a transverse colon polyp that was hyperplastic in 2010 and the polyp removed this time was only 4 mm, the adenoma.  Internal hemorrhoids were also noted on most recent exam.  There is very rare bleeding with wiping.  He does not take laxatives.  He gets an urge to defecate but he then he has difficulty producing a bowel movement.  He does take a magnesium supplement but not to help with defecation.  He has not tried fiber or dietary changes otherwise.  He does not have significant urinary issues outside  of his BPH. Allergies  Allergen Reactions  . Other Nausea And Vomiting    All pain medications.  Can tolerate if anti-nausea medication first    Current Meds  Medication Sig  . Ascorbic Acid (VITAMIN C) 1000 MG tablet Take 1,000 mg by mouth daily.  . Cholecalciferol (VITAMIN D3) 50 MCG (2000 UT) TABS Take 1 tablet by mouth daily.  . Glucosamine-Chondroitin (GLUCOSAMINE CHONDR COMPLEX PO) Take 1,500 mg by mouth daily.   Marland Kitchen losartan (COZAAR) 50 MG tablet Take 50 mg by mouth daily.  . magnesium oxide (MAG-OX) 400 MG tablet Take 400 mg by mouth daily.  .  Multiple Vitamin (MULTI VITAMIN DAILY PO) Take 1 tablet by mouth daily.   . Omega-3 Fatty Acids (FISH OIL) 1000 MG CAPS Take 1 capsule by mouth daily.  . Potassium 99 MG TABS Take 99 mg by mouth daily.   . rosuvastatin (CRESTOR) 5 MG tablet Take 5 mg by mouth daily.  Marland Kitchen Specialty Vitamins Products (ICAPS LUTEIN-ZEAXANTHIN PO) Take 1 tablet by mouth daily.  . tamsulosin (FLOMAX) 0.4 MG CAPS capsule Take 0.4 mg by mouth daily.  . Turmeric 500 MG CAPS Take 1,000 mg by mouth daily.   Past Medical History:  Diagnosis Date  . Adenomatous colon polyp   . Anemia   . Arthritis   . Basal cell carcinoma   . BPH (benign prostatic hyperplasia)   . Cataract of left eye    due to trauma in left eye  . Diverticulosis    in sigmoid colon and in the descending colon  . Hemorrhoids   . Hypercholesterolemia   . Hypertension   . Iron deficiency    anemia  . Lichen planus    of tongue  . Osteoarthritis   . PONV (postoperative nausea and vomiting)   . Rectal prolapse   . Shingles    right ophthalmic involvement  . Tinnitus    Past Surgical History:  Procedure Laterality Date  . INGUINAL HERNIA REPAIR Bilateral   . KNEE SURGERY     arthrosocopy   . removal of cyst     sphenoid cyst   . ROTATOR CUFF REPAIR Bilateral   . 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   Procedure: LEFT TOTAL KNEE ARTHROPLASTY;  Surgeon: Paralee Cancel, MD;  Location: [facility];  Service: Orthopedics;  Laterality: Left;  70 mins with block   Social History   Social History Narrative   Married no children retired from [employer]   2 alcoholic drinks a day 2 caffeinated beverages a day never smoker no drug use   family history includes AAA (abdominal aortic aneurysm) in his father; Heart disease in his father; Hypertension in his father; Obesity in his brother; Tuberculosis in his mother.   Review of Systems As per HPI some chronic back pain and arthritis symptoms of the joints all other review systems  negative  Objective:   Physical Exam BP 108/60 (BP Location: Left Arm, Patient Position: Sitting, Cuff Size: Normal)   Pulse 76   Ht 5' 8.25" (1.734 m) Comment: height measured without shoes  Wt 158 lb 8 oz (71.9 kg)   BMI 23.92 kg/m  NAD Abdomen soft and NT, no HSM/mass  Rectal - NL anoderm, no prolapse with valsalva Neg anal wink Nl resting and voluntary anal tone No mass, + prostate hypertrophy w/o nodule Appropriate abdominal CTR descent and relaxation with simulated defecation

## 2020-05-03 NOTE — Patient Instructions (Addendum)
Please take benefiber daily: Take either 2 tablespoons once daily or 1 tablespoon twice a day.  Physical therapy will contact you about an appointment. Please set that up for after your return from [place], [place]. Then when you complete therapy come back and see Dr Carlean Purl.   We have given you reading information today on high fiber diet and anal prolapse and benefiber.  I appreciate the opportunity to care for you. Silvano Rusk, MD, [place]!!

## 2020-05-04 ENCOUNTER — Encounter: Payer: Self-pay | Admitting: Internal Medicine

## 2020-05-11 ENCOUNTER — Ambulatory Visit: Payer: Medicare Other | Attending: Internal Medicine | Admitting: Physical Therapy

## 2020-05-11 ENCOUNTER — Encounter: Payer: Self-pay | Admitting: Physical Therapy

## 2020-05-11 ENCOUNTER — Other Ambulatory Visit: Payer: Self-pay

## 2020-05-11 DIAGNOSIS — R278 Other lack of coordination: Secondary | ICD-10-CM

## 2020-05-11 DIAGNOSIS — M6281 Muscle weakness (generalized): Secondary | ICD-10-CM | POA: Diagnosis not present

## 2020-05-11 DIAGNOSIS — K623 Rectal prolapse: Secondary | ICD-10-CM | POA: Insufficient documentation

## 2020-05-11 NOTE — Therapy (Signed)
[facility] [address] [place], [place], [address] Phone: [phone]   Fax:  [phone]  Physical Therapy Evaluation  Patient Details  Name: Charles Norton MRN: [id number] Date of Birth: 1940/07/10 Referring Provider (PT): Dr. Silvano Rusk   Encounter Date: 05/11/2020   PT End of Session - 05/11/20 0933    Visit Number 1    Date for PT Re-Evaluation 07/06/20    Authorization Type BCBS    PT Start Time 0845    PT Stop Time 0925    PT Time Calculation (min) 40 min    Activity Tolerance Patient tolerated treatment well    Behavior During Therapy [facility] for tasks assessed/performed           Past Medical History:  Diagnosis Date  . Adenomatous colon polyp   . Anemia   . Arthritis   . Basal cell carcinoma   . BPH (benign prostatic hyperplasia)   . Cataract of left eye    due to trauma in left eye  . Diverticulosis    in sigmoid colon and in the descending colon  . Hemorrhoids   . Hypercholesterolemia   . Hypertension   . Iron deficiency    anemia  . Lichen planus    of tongue  . Osteoarthritis   . PONV (postoperative nausea and vomiting)   . Rectal prolapse   . Shingles    right ophthalmic involvement  . Tinnitus     Past Surgical History:  Procedure Laterality Date  . COLONOSCOPY     2010 and 2020  . INGUINAL HERNIA REPAIR Bilateral   . KNEE SURGERY     arthrosocopy   . removal of cyst     sphenoid cyst   . ROTATOR CUFF REPAIR Bilateral   . 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   Procedure: LEFT TOTAL KNEE ARTHROPLASTY;  Surgeon: Paralee Cancel, MD;  Location: [facility];  Service: Orthopedics;  Laterality: Left;  70 mins with block    There were no vitals filed for this visit.    Subjective Assessment - 05/11/20 0848    Subjective Patient has had issue for 20 years. Patient has abdnormal bowel habits. Turned into rectum prolapse. Takes 15-20 minutes to have a bowel movement. Used to be  1-5 days but now 1 time per day. I now go to the bathroom and sit instead of waiting to have an urge to void. Sometimes gets the urge to have a bowel movement.    Patient Stated Goals reduce the prolapse and improve toileting techniques    Currently in Pain? Yes    Pain Score 5     Pain Location Back    Pain Orientation Left    Pain Descriptors / Indicators Discomfort;Aching    Pain Type Chronic pain    Pain Onset More than a month ago    Pain Frequency Intermittent    Aggravating Factors  laying on left side    Pain Relieving Factors lay on right side    Multiple Pain Sites No              [facility] PT Assessment - 05/11/20 0001      Assessment   Medical Diagnosis K62.3 Rectal prlapse; K59.09 Chronic constipation    Referring Provider (PT) Dr. Silvano Rusk    Onset Date/Surgical Date --   chronic   Prior Therapy none      Precautions   Precautions None      Restrictions  Weight Bearing Restrictions No      Balance Screen   Has the patient fallen in the past 6 months No    Has the patient had a decrease in activity level because of a fear of falling?  No    Is the patient reluctant to leave their home because of a fear of falling?  No      Home Ecologist residence      Prior Function   Vocation Retired    Leisure play golf, tennis, bridge      Cognition   Overall Cognitive Status Within Functional Limits for tasks assessed      Posture/Postural Control   Posture/Postural Control No significant limitations      ROM / Strength   AROM / PROM / Strength AROM;PROM;Strength      AROM   Lumbar Extension decreased by 25% with no pain    Lumbar - Right Side Bend decreased by 25% with pain      Strength   Right Hip Flexion 4/5    Left Hip Flexion 4/5      Palpation   Palpation comment tightness in the diaphgram                      Objective measurements completed on examination: See above findings.     Pelvic Floor  Special Questions - 05/11/20 0001    Urinary Leakage No    Fecal incontinence --   start Type 1 then goes to Type 4   Skin Integrity Intact    Pelvic Floor Internal Exam Patient confirms identification and approves PT to assess pelvic floor and treatment    Exam Type Rectal    Sensation Patient has difficulty with pushing the therapist finger out the anal canal and relaxing the pelvic floor    Palpation weakness in the post. puborectalis and tightnss in the ant. rectum    Strength fair squeeze, definite lift            [facility] Adult PT Treatment/Exercise - 05/11/20 0001      Self-Care   Self-Care Other Self-Care Comments    Other Self-Care Comments  education on correct toileting techniques with knees above chest, breathing with a low GRR                  PT Education - 05/11/20 0931    Education Details educated patient on correct toileting technique; gave him you tube video to watch for abdominal massage    Person(s) Educated Patient    Methods Explanation;Demonstration;Verbal cues;Handout    Comprehension Returned demonstration;Verbalized understanding            PT Short Term Goals - 05/11/20 0944      PT SHORT TERM GOAL #1   Title able to demonstrate abdominal massage to improve peristalic motion of the intestines    Time 4    Period Weeks    Target Date 06/08/20      PT SHORT TERM GOAL #2   Title able to demonstrate correct diaphgramatic breathing with opening of the lower rib cage then the pelvic floor    Time 4    Period Weeks    Status New    Target Date 06/08/20             PT Long Term Goals - 05/11/20 0945      PT LONG TERM GOAL #1   Title independent with advanced HEP  Time 8    Period Weeks    Status New    Target Date 07/06/20      PT LONG TERM GOAL #2   Title able to feel the urge to have a bowel movement increased >/= 50%    Time 8    Period Weeks    Status New    Target Date 07/06/20      PT LONG TERM GOAL #3   Title able  to push his bowel movements with >/= 50% greater ease and only sitting on the commode for 10 minutes or less    Time 8    Period Weeks    Target Date 07/06/20      PT LONG TERM GOAL #4   Title rectal prolapse reduced by 50% of the time due to improve bowel health and toileting    Time 8    Period Weeks    Status New    Target Date 07/06/20                  Plan - 05/11/20 0934    Clinical Impression Statement Patient is a 79 year old male with chronic constipation and rectal prolapse for the past 20 years. Patient reports intermittent back pain at level 5/10. Patient pain increases with laying on left side. Patient is having a bowel movement daily but will sit on the commode for 15-20 minutes. Patient does not always wait fot the urge to have a bowel movement. Patient reports his stool is initial Type 2 but will end with a Type 4. Patient has increased his fiber in the past week and notices more Type 4 bowel movement. Pelvic floor strength is 3/5 with some weakness on the posterior EAS and IAS. Patient puborectalis does not always come forward as well. Patient has troubel bulging the pelvic floor to push the therapist finer out of the anal canal. Patient has tightness in the lower rib cage and diaphragm to expand with diaphragmatic breathing. Patient will benefit from skilled therapy to improve pelvic floor coordination to reduce his rectal prolapse and push a bowel movement out.    Personal Factors and Comorbidities Comorbidity 2    Comorbidities rectal prolapse; bilateral inguinal hernia repair    Examination-Activity Limitations Toileting    Stability/Clinical Decision Making Stable/Uncomplicated    Clinical Decision Making Low    Rehab Potential Excellent    PT Frequency 1x / week    PT Duration 8 weeks    PT Treatment/Interventions ADLs/Self Care Home Management;Biofeedback;Neuromuscular re-education;Therapeutic exercise;Therapeutic activities;Patient/family education;Manual  techniques;Spinal Manipulations    PT Next Visit Plan abominal massage; diaphragmatic breathing to open the pelvic floor; hip stretches; bowel health    Consulted and Agree with Plan of Care Patient           Patient will benefit from skilled therapeutic intervention in order to improve the following deficits and impairments:  Decreased coordination,Increased fascial restricitons,Pain,Decreased strength,Decreased mobility,Decreased activity tolerance  Visit Diagnosis: Muscle weakness (generalized) - Plan: PT plan of care cert/re-cert  Other lack of coordination - Plan: PT plan of care cert/re-cert  Rectal prolapse - Plan: PT plan of care cert/re-cert     Problem List Patient Active Problem List   Diagnosis Date Noted  . 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 Abnormal EKG 02/26/2017  . Dyslipidemia 02/26/2017  . Elevated blood pressure reading in office without diagnosis of hypertension 02/26/2017  . Preoperative cardiovascular examination 02/26/2017  . HERPES  SIMPLEX INFECTION, RECURRENT 08/26/2008  . ANEMIA, IRON DEFICIENCY 08/26/2008  . DIVERTICULOSIS, COLON, HX OF 08/26/2008  . INGUINAL HERNIORRHAPHY, HX OF 07/12/2004   Earlie Counts, PT 05/11/20 9:49 AM   [facility] [address] [place], [place], [address] Phone: [phone]   Fax:  [phone]  Name: Charles Norton MRN: [id number] Date of Birth: [birth year]

## 2020-05-11 NOTE — Patient Instructions (Addendum)
Toileting Techniques for Bowel Movements    An Evacuation/Defecation Plan   Here are the 4 basic points:  1. Lean forward enough for your elbows to rest on your knees 2. Support your feet on the floor or use a low stool if your feet don't touch the floor  3. Push out your belly as if you have swallowed a beach ball--you should feel a widening of your waist. "Belly Big, Belly Hard" 4. Open and relax your pelvic floor muscles, rather than tightening around the anus  While you are sitting on the toilet pay attention to the following areas: . Jaw and mouth position- relaxed not clenched . Angle of your hips - leaning slightly forward . Whether your feet touch the ground or not - should be flat and supported . Arm placement - rest against your thighs . Spine position - flat back . Waist . Breathing - exhale as you push (like blowing up a balloon or try using other sounds such as ahhhh, shhhhh, ohhhh or grrrrrrr) . Belly - hard and tight as you push . Anus (opening of the anal canal) - relaxed and open as you push . Anus - Tighten and lift pulling the muscle back in after you are done or if taking a break  If you are not successful after 10-15 minutes, try again later.  Avoid negative self-talk about your toileting experience.   Read this for more details and ask your PT if you need suggestions for adjustments or limitations:  1) Sitting on the toilet  a) Make sure your feet are supported - flat on the floor or step stool b) Many people find it effective to lean forward or raise their knees.  Propping your feet on a step stool (Squatty Potty is a brand name) can help the muscles around the anus to relax  c) When you lean forward, place your forearms on your thighs for support  2) Relaxing a) Breathe deeply and slowly in through your nose and out through your mouth. b) To become aware of how to relax your muscles, contracting and releasing muscles can be helpful.  Pull your pelvic floor  muscles in tightly by using the image of holding back gas, or closing around the anus (visualize making a circle smaller) and lifting the anus up and in.  Then release the muscles and your anus should drop down and feel open. Repeat 5 times ending with the feeling of relaxation. c) Keep your pelvic floor muscles relaxed; let your belly bulge out. d) The digestive tract starts at the mouth and ends at the anal opening, so be sure to relax both ends of the tube.  Place your tongue on the roof of your mouth with your teeth separated.  This helps relax your mouth and will help to relax the anus at the same time.  3) Emptying (defecation) a) Keep your pelvic floor and sphincter relaxed, then bulge your anal muscles.  Make the anal opening wide.  b) Stick your belly out as if you have swallowed a beach ball. c) Make your belly wall hard using your belly muscles while continuing to breathe. Doing this makes it easier to open your anus. d) Breath out and give a grunt (or try using other sounds such as ahhhh, shhhhh, ohhhh or grrrrrrr). e)  Can also try to act as if you are blowing up a balloon as you push  4) Finishing a) As you finish your bowel movement, pull the pelvic floor muscles   up and in.  This will leave your anus in the proper place rather than remaining pushed out and down. If you leave your anus pushed out and down, it will start to feel as though that is normal and give you incorrect signals about needing to have a bowel movement. [facility] [address], [address] [place], [place] [address] Phone # [phone] Fax [phone]  

## 2020-05-31 ENCOUNTER — Encounter: Payer: Self-pay | Admitting: Physical Therapy

## 2020-05-31 ENCOUNTER — Ambulatory Visit: Payer: Medicare Other | Attending: Internal Medicine | Admitting: Physical Therapy

## 2020-05-31 ENCOUNTER — Other Ambulatory Visit: Payer: Self-pay

## 2020-05-31 DIAGNOSIS — R278 Other lack of coordination: Secondary | ICD-10-CM | POA: Insufficient documentation

## 2020-05-31 DIAGNOSIS — K623 Rectal prolapse: Secondary | ICD-10-CM | POA: Insufficient documentation

## 2020-05-31 DIAGNOSIS — M6281 Muscle weakness (generalized): Secondary | ICD-10-CM | POA: Insufficient documentation

## 2020-05-31 NOTE — Patient Instructions (Signed)
Access Code: [email or web address] URL: [email or web address] Date: 05/31/2020 Prepared by: Eulis Foster  Program Notes roll tennis ball along the pelvic floor   Exercises Supine Diaphragmatic Breathing - 2 x daily - 7 x weekly - 1 sets - 10 reps Seated Diaphragmatic Breathing - 2 x daily - 7 x weekly - 1 sets - 10 reps [facility] [address] [place], [place] [address] Phone # [phone] Fax [phone]

## 2020-05-31 NOTE — Therapy (Signed)
[facility] [address], [address] [place], [place], [address] Phone: [phone]   Fax:  [phone]  Physical Therapy Treatment  Patient Details  Name: Charles Norton MRN: [id number] Date of Birth: 1940-08-28 Referring Provider (PT): Dr. Stan Head   Encounter Date: 05/31/2020   PT End of Session - 05/31/20 1316    Visit Number 2    Date for PT Re-Evaluation 07/06/20    Authorization Type BCBS    PT Start Time 1230    PT Stop Time 1110    PT Time Calculation (min) 1360 min    Activity Tolerance Patient tolerated treatment well    Behavior During Therapy [facility] for tasks assessed/performed           Past Medical History:  Diagnosis Date  . Adenomatous colon polyp   . Anemia   . Arthritis   . Basal cell carcinoma   . BPH (benign prostatic hyperplasia)   . Cataract of left eye    due to trauma in left eye  . Diverticulosis    in sigmoid colon and in the descending colon  . Hemorrhoids   . Hypercholesterolemia   . Hypertension   . Iron deficiency    anemia  . Lichen planus    of tongue  . Osteoarthritis   . PONV (postoperative nausea and vomiting)   . Rectal prolapse   . Shingles    right ophthalmic involvement  . Tinnitus     Past Surgical History:  Procedure Laterality Date  . COLONOSCOPY     2010 and 2020  . INGUINAL HERNIA REPAIR Bilateral   . KNEE SURGERY     arthrosocopy   . removal of cyst     sphenoid cyst   . ROTATOR CUFF REPAIR Bilateral   . 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   Procedure: LEFT TOTAL KNEE ARTHROPLASTY;  Surgeon: Durene Romans, MD;  Location: [facility];  Service: Orthopedics;  Laterality: Left;  70 mins with block    There were no vitals filed for this visit.   Subjective Assessment - 05/31/20 1234    Subjective I still have constipation.    Patient Stated Goals reduce the prolapse and improve toileting techniques    Currently in Pain? Yes    Pain Score 5     Pain  Location Back    Pain Orientation Left    Pain Descriptors / Indicators Discomfort;Aching    Pain Type Chronic pain    Pain Onset More than a month ago    Pain Frequency Intermittent    Aggravating Factors  laying on left side    Pain Relieving Factors lay on right side    Multiple Pain Sites No                             [facility] Adult PT Treatment/Exercise - 05/31/20 0001      Therapeutic Activites    Therapeutic Activities Other Therapeutic Activities    Other Therapeutic Activities reviewed correct toileting techniques with correct breath      Neuro Re-ed    Neuro Re-ed Details  diaphragmatic breathing in supine and sitting using tactile and veral clues to expand the lower rib cage into her stomach then pelvic floor      Manual Therapy   Manual Therapy Soft tissue mobilization;Myofascial release    Soft tissue mobilization along the anterior rib intercoastals, diaphragm, abdominal muscles    Myofascial  Release tissue rolling of the abdomen, release of the lower abdomen, mesenteric root, and upper midling abdominals                  PT Education - 05/31/20 1312    Education Details Access Code: [email or web address]    Person(s) Educated Patient    Methods Explanation;Demonstration;Handout;Verbal cues    Comprehension Returned demonstration;Verbalized understanding            PT Short Term Goals - 05/31/20 1602      PT SHORT TERM GOAL #1   Title able to demonstrate abdominal massage to improve peristalic motion of the intestines    Time 4    Period Weeks    Status Achieved             PT Long Term Goals - 05/11/20 0945      PT LONG TERM GOAL #1   Title independent with advanced HEP    Time 8    Period Weeks    Status New    Target Date 07/06/20      PT LONG TERM GOAL #2   Title able to feel the urge to have a bowel movement increased >/= 50%    Time 8    Period Weeks    Status New    Target Date 07/06/20      PT LONG TERM GOAL #3    Title able to push his bowel movements with >/= 50% greater ease and only sitting on the commode for 10 minutes or less    Time 8    Period Weeks    Target Date 07/06/20      PT LONG TERM GOAL #4   Title rectal prolapse reduced by 50% of the time due to improve bowel health and toileting    Time 8    Period Weeks    Status New    Target Date 07/06/20                 Plan - 05/31/20 1318    Clinical Impression Statement Patient was able to perform diaphragmatic breathing in supine and sitting. Patient has improved abdominal and diaphragm mobility after manual work. Patient is doing his current HEP. Patient understands how to perform abdominal massage and does it daily. Patient will benefit from skilled therapy to improve pelvic floor coordination to reduce his rectal prolapse and push a bowel movement out.    Personal Factors and Comorbidities Comorbidity 2    Comorbidities rectal prolapse; bilateral inguinal hernia repair    Examination-Activity Limitations Toileting    Stability/Clinical Decision Making Stable/Uncomplicated    Rehab Potential Excellent    PT Frequency 1x / week    PT Duration 8 weeks    PT Treatment/Interventions ADLs/Self Care Home Management;Biofeedback;Neuromuscular re-education;Therapeutic exercise;Therapeutic activities;Patient/family education;Manual techniques;Spinal Manipulations    PT Next Visit Plan ask how long he is on the toilet, hip stretches, bowel health, trunk twist to improve intestinal movement; massage the sacrum, sidely pull the gluteal to the hip    PT Home Exercise Plan Access Code: [email or web address]    Recommended Other Services MD signed initial eval    Consulted and Agree with Plan of Care Patient           Patient will benefit from skilled therapeutic intervention in order to improve the following deficits and impairments:  Decreased coordination,Increased fascial restricitons,Pain,Decreased strength,Decreased mobility,Decreased activity  tolerance  Visit Diagnosis: Muscle weakness (generalized)  Other lack of coordination  Rectal prolapse  Problem List Patient Active Problem List   Diagnosis Date Noted  . 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 Abnormal EKG 02/26/2017  . Dyslipidemia 02/26/2017  . Elevated blood pressure reading in office without diagnosis of hypertension 02/26/2017  . Preoperative cardiovascular examination 02/26/2017  . HERPES SIMPLEX INFECTION, RECURRENT 08/26/2008  . ANEMIA, IRON DEFICIENCY 08/26/2008  . DIVERTICULOSIS, COLON, HX OF 08/26/2008  . INGUINAL HERNIORRHAPHY, HX OF 07/12/2004    Earlie Counts, PT 05/31/20 4:04 PM   [facility] [address] [place], [place], [address] Phone: [phone]   Fax:  [phone]  Name: THORNTON DENZEL MRN: [id number] Date of Birth: 1940-05-31

## 2020-06-06 ENCOUNTER — Other Ambulatory Visit: Payer: Self-pay

## 2020-06-06 ENCOUNTER — Ambulatory Visit: Payer: Medicare Other | Admitting: Physical Therapy

## 2020-06-06 ENCOUNTER — Encounter: Payer: Self-pay | Admitting: Physical Therapy

## 2020-06-06 DIAGNOSIS — M6281 Muscle weakness (generalized): Secondary | ICD-10-CM | POA: Diagnosis not present

## 2020-06-06 DIAGNOSIS — K623 Rectal prolapse: Secondary | ICD-10-CM

## 2020-06-06 DIAGNOSIS — R278 Other lack of coordination: Secondary | ICD-10-CM

## 2020-06-06 NOTE — Patient Instructions (Addendum)
Strategies for Constipation - Developing a Bowel Routine  Bowels love routine.  Choose the best time of day to have a bowel movement.  Usually the best time of day for a bowel movement in 30 min to 1 hour after breakfast or lunch.  It can take a week or longer to transition to new bowel routines, but if you follow the guidelines below, you will start to retrain your system for more successful bowel routines.  Eat breakfast every morning to try to get things moving along.  Eating stimulates the bowels through turning on the gastrocolic reflex, which are the wave-like contractions of smooth muscle in your intestines to help move feces along.  Adding in a hot beverage with breakfast may help as well.  Think of your digestive tract as being a conveyor belt - new food comes in and pushes along "old food," with the end of the line being a bowel movement.  You may add 1/4 cup or 1/3 cup of prune juice every evening to help stimulate morning bowel function until you see more successful bowel function.  Create routine in both your timing and portions of intake of food and drink throughout the day.  Eat each of your meals at consistent times of the day.  For portions, the size of each meal may vary, but try to eat consistent portions each day at breakfast, lunch and dinner.  For example, you may have a small breakfast every day and a big lunch every day. This is fine if it is a consistent pattern of intake.  With each meal, aim for at least 2 servings of fruits and vegetables and at least one serving of whole grains (whole grain cereal, brown rice, bran, whole wheat or rye bread, oatmeal) to get some fiber in your system.  These foods provide soft bulk for the bowel.  Drink water.  Aim for at least 8 large glasses of water a day.  Water helps the stool stay softer and easier to pass.  If you are adding more fiber in your diet, be sure to increase your water intake too.  Exercise can help move things along the  digestive tract.  Exercise may be a walk, jog, yoga, exercise video, gym time, or even household walking or marching in place at the kitchen countertop.  Pairing exercise and breakfast as part of your morning routine may help establish a morning bowel routine.  Listen to your body's signals that it is time to go to the bathroom!  Our body has automatic reflexes that signal when it is time to have a bowel movement.  If you are establishing routines to help stimulate your bowels, be sure to allow time for toileting.  For example, if you typically have a bowel movement after breakfast, get up early enough to eat AND empty your bowels before your schedule gets too busy or before it becomes inconvenient to access a bathroom.  If we routinely delay or hold back a bowel movement, these reflexes can stop signaling, leading to constipation which may become chronic (long-term).    Ensure proper toileting technique including posture, breathing and emptying strategies.*  Abdominal massage may also help stimulate your bowels and reduce the discomfort that accompanies constipation.* Massage the sacrum 1 time per day.   *Your PT can educate you on toileting strategies and self-massage for your abdomen.  Your PT may also ask you to complete a food log and bowel diary for 1-3 days to help identify patterns and  make suggestions for increased success in managing your symptoms.  Massage around the anus prior to bowel movement.   [facility] [address] [place], [place] [address] Phone # [phone] Fax [phone]   Access Code: [email or web address] URL: [email or web address] Date: 06/06/2020 Prepared by: Earlie Counts  Exercises Supine Piriformis Stretch with Leg Straight - 1 x daily - 7 x weekly - 1 sets - 2 reps - 30 sec hold Supine Single Knee to Chest Stretch - 1 x daily - 7 x weekly - 1 sets - 2 reps Supine Pelvic Floor Stretch - 1 x daily - 7 x weekly - 1 sets - 1 reps - 30  sec to 1 min hold Supine Abdominal Wall Massage - 1 x daily - 7 x weekly - 1 sets - 10 reps

## 2020-06-06 NOTE — Therapy (Signed)
[facility] [address], [place], [place], [address] Phone: [phone]   Fax:  [phone]  Physical Therapy Treatment  Patient Details  Name: Charles Norton MRN: [id number] Date of Birth: 07/02/1940 Referring Provider (PT): Dr. Silvano Rusk   Encounter Date: 06/06/2020   PT End of Session - 06/06/20 0950    Visit Number 3    Date for PT Re-Evaluation 07/06/20    Authorization Type BCBS    PT Start Time 0845    PT Stop Time 0940    PT Time Calculation (min) 55 min    Activity Tolerance Patient tolerated treatment well    Behavior During Therapy [facility] for tasks assessed/performed           Past Medical History:  Diagnosis Date  . Adenomatous colon polyp   . Anemia   . Arthritis   . Basal cell carcinoma   . BPH (benign prostatic hyperplasia)   . Cataract of left eye    due to trauma in left eye  . Diverticulosis    in sigmoid colon and in the descending colon  . Hemorrhoids   . Hypercholesterolemia   . Hypertension   . Iron deficiency    anemia  . Lichen planus    of tongue  . Osteoarthritis   . PONV (postoperative nausea and vomiting)   . Rectal prolapse   . Shingles    right ophthalmic involvement  . Tinnitus     Past Surgical History:  Procedure Laterality Date  . COLONOSCOPY     2010 and 2020  . INGUINAL HERNIA REPAIR Bilateral   . KNEE SURGERY     arthrosocopy   . removal of cyst     sphenoid cyst   . ROTATOR CUFF REPAIR Bilateral   . 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   Procedure: LEFT TOTAL KNEE ARTHROPLASTY;  Surgeon: Paralee Cancel, MD;  Location: [facility];  Service: Orthopedics;  Laterality: Left;  70 mins with block    There were no vitals filed for this visit.   Subjective Assessment - 06/06/20 0850    Subjective I fele the manual work helped a little. Sit too long will have the prolapse.    Patient Stated Goals reduce the prolapse and improve toileting techniques     Currently in Pain? No/denies              [facility] PT Assessment - 06/06/20 0001      Assessment   Medical Diagnosis K62.3 Rectal prlapse; K59.09 Chronic constipation    Referring Provider (PT) Dr. Silvano Rusk    Onset Date/Surgical Date --   chronic   Prior Therapy none      Precautions   Precautions None      Restrictions   Weight Bearing Restrictions No      Prior Function   Vocation Retired    Leisure play golf, tennis, bridge      Cognition   Overall Cognitive Status Within Functional Limits for tasks assessed      Posture/Postural Control   Posture/Postural Control No significant limitations      AROM   Lumbar Extension decreased by 25% with no pain    Lumbar - Right Side Bend decreased by 25% with pain      Strength   Right Hip Flexion 4/5    Left Hip Flexion 4/5  [facility] Adult PT Treatment/Exercise - 06/06/20 0001      Self-Care   Self-Care Other Self-Care Comments    Other Self-Care Comments  education on bowel health.      Therapeutic Activites    Therapeutic Activities Other Therapeutic Activities    Other Therapeutic Activities reviewed correct toileting with breath and elongating the pelvic floor instead of breathing in his chest      Neuro Re-ed    Neuro Re-ed Details  diaphragmatic breathing in supine and sitting using tactile and veral clues to expand the lower rib cage into her stomach then pelvic floor, added tactile cues to the perineal body to feel the drop of the pelvic floor      Lumbar Exercises: Stretches   Single Knee to Chest Stretch Right;Left;1 rep;30 seconds    Piriformis Stretch Right;Left;1 rep;30 seconds      Lumbar Exercises: Quadruped   Other Quadruped Lumbar Exercises hip hinging with hips internally rotated to elongate the pelvic floor      Manual Therapy   Manual Therapy Soft tissue mobilization;Myofascial release    Soft tissue mobilization along the anterior rib intercoastals,  diaphragm, abdominal muscles, transverse abdominus, obliques    Myofascial Release tissue rolling of the abdomen, release of the lower abdomen, mesenteric root, and upper midling abdominals                  PT Education - 06/06/20 0947    Education Details Access Code: [email or web address]; reviewed toilething technique; bowel health, massage the anus prior to bowel movement    Person(s) Educated Patient    Methods Explanation;Demonstration;Verbal cues    Comprehension Returned demonstration            PT Short Term Goals - 06/06/20 0956      PT SHORT TERM GOAL #1   Title able to demonstrate abdominal massage to improve peristalic motion of the intestines    Time 4    Period Weeks    Status Achieved    Target Date 06/08/20      PT SHORT TERM GOAL #2   Title able to demonstrate correct diaphgramatic breathing with opening of the lower rib cage then the pelvic floor    Time 4    Period Weeks    Status Achieved    Target Date 06/08/20             PT Long Term Goals - 05/11/20 0945      PT LONG TERM GOAL #1   Title independent with advanced HEP    Time 8    Period Weeks    Status New    Target Date 07/06/20      PT LONG TERM GOAL #2   Title able to feel the urge to have a bowel movement increased >/= 50%    Time 8    Period Weeks    Status New    Target Date 07/06/20      PT LONG TERM GOAL #3   Title able to push his bowel movements with >/= 50% greater ease and only sitting on the commode for 10 minutes or less    Time 8    Period Weeks    Target Date 07/06/20      PT LONG TERM GOAL #4   Title rectal prolapse reduced by 50% of the time due to improve bowel health and toileting    Time 8    Period Weeks    Status New  Target Date 07/06/20                 Plan - 06/06/20 0951    Clinical Impression Statement Patient reports a little improvement with toileting. Today he was able to bulge the pelvic floor with correct breath for the first time and  demonstrate it in sitting while facilitating toileting. Patient has less tightness in the diaphgram and upper abdominals from the manual work. Patient is doing yoga 4 times per week and understand the twisting motion will help with the digestive system. Patient is not sitting on the commode as long due to increases chance of rectal prolapse. Patient understands the bearing down can increase chance of prolapse. Patient will be in [place] for 2 months and will resume therapy in March 2022 when he is back in town. Patient will benefit from skilled therapy to improve pelvic floor coordination to reduce his rectal prolpase and push a bowel movement out.    Personal Factors and Comorbidities Comorbidity 2    Comorbidities rectal prolapse; bilateral inguinal hernia repair    Examination-Activity Limitations Toileting    Stability/Clinical Decision Making Stable/Uncomplicated    Rehab Potential Excellent    PT Frequency 1x / week    PT Duration 8 weeks    PT Treatment/Interventions ADLs/Self Care Home Management;Biofeedback;Neuromuscular re-education;Therapeutic exercise;Therapeutic activities;Patient/family education;Manual techniques;Spinal Manipulations    PT Next Visit Plan reassess patient to see how he did in [place], see if patient is to continue, work on the anterior rectal area and movement of the puborectalis; ask about feeling the urge to have a bowel movement    PT Home Exercise Plan Access Code: [email or web address]    Consulted and Agree with Plan of Care Patient           Patient will benefit from skilled therapeutic intervention in order to improve the following deficits and impairments:  Decreased coordination,Increased fascial restricitons,Pain,Decreased strength,Decreased mobility,Decreased activity tolerance  Visit Diagnosis: Muscle weakness (generalized)  Other lack of coordination  Rectal prolapse     Problem List Patient Active Problem List   Diagnosis Date Noted  . S/P left TKA  03/03/2017  . 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 Abnormal EKG 02/26/2017  . Dyslipidemia 02/26/2017  . Elevated blood pressure reading in office without diagnosis of hypertension 02/26/2017  . Preoperative cardiovascular examination 02/26/2017  . HERPES SIMPLEX INFECTION, RECURRENT 08/26/2008  . ANEMIA, IRON DEFICIENCY 08/26/2008  . DIVERTICULOSIS, COLON, HX OF 08/26/2008  . INGUINAL HERNIORRHAPHY, HX OF 07/12/2004    Earlie Counts, PT 06/06/20 9:59 AM   [facility] [address] [place], [place], [address] Phone: [phone]   Fax:  [phone]  Name: Charles Norton MRN: [id number] Date of Birth: September 02, 1940

## 2020-08-14 DIAGNOSIS — H40012 Open angle with borderline findings, low risk, left eye: Secondary | ICD-10-CM | POA: Diagnosis not present

## 2020-08-14 DIAGNOSIS — H17821 Peripheral opacity of cornea, right eye: Secondary | ICD-10-CM | POA: Diagnosis not present

## 2020-08-14 DIAGNOSIS — H0102A Squamous blepharitis right eye, upper and lower eyelids: Secondary | ICD-10-CM | POA: Diagnosis not present

## 2020-08-14 DIAGNOSIS — H2511 Age-related nuclear cataract, right eye: Secondary | ICD-10-CM | POA: Diagnosis not present

## 2020-08-16 ENCOUNTER — Encounter: Payer: Self-pay | Admitting: Physical Therapy

## 2020-08-16 ENCOUNTER — Other Ambulatory Visit: Payer: Self-pay

## 2020-08-16 ENCOUNTER — Ambulatory Visit: Payer: Medicare Other | Attending: Internal Medicine | Admitting: Physical Therapy

## 2020-08-16 DIAGNOSIS — K623 Rectal prolapse: Secondary | ICD-10-CM | POA: Insufficient documentation

## 2020-08-16 DIAGNOSIS — R278 Other lack of coordination: Secondary | ICD-10-CM | POA: Diagnosis not present

## 2020-08-16 DIAGNOSIS — M6281 Muscle weakness (generalized): Secondary | ICD-10-CM | POA: Insufficient documentation

## 2020-08-16 NOTE — Therapy (Signed)
[facility] [address] [place], [place], [address] Phone: [phone]   Fax:  [phone]  Physical Therapy Treatment/Discharge  Patient Details  Name: Charles Norton MRN: [id number] Date of Birth: [birth year] Referring Provider (PT): Dr. Silvano Rusk   Encounter Date: 08/16/2020   PT End of Session - 08/16/20 0919    Visit Number 4    Date for PT Re-Evaluation 07/06/20    Authorization Type BCBS    PT Start Time 0845    PT Stop Time 0908    PT Time Calculation (min) 23 min    Activity Tolerance Patient tolerated treatment well    Behavior During Therapy [facility] for tasks assessed/performed           Past Medical History:  Diagnosis Date  . Adenomatous colon polyp   . Anemia   . Arthritis   . Basal cell carcinoma   . BPH (benign prostatic hyperplasia)   . Cataract of left eye    due to trauma in left eye  . Diverticulosis    in sigmoid colon and in the descending colon  . Hemorrhoids   . Hypercholesterolemia   . Hypertension   . Iron deficiency    anemia  . Lichen planus    of tongue  . Osteoarthritis   . PONV (postoperative nausea and vomiting)   . Rectal prolapse   . Shingles    right ophthalmic involvement  . Tinnitus     Past Surgical History:  Procedure Laterality Date  . COLONOSCOPY     2010 and 2020  . INGUINAL HERNIA REPAIR Bilateral   . KNEE SURGERY     arthrosocopy   . removal of cyst     sphenoid cyst   . ROTATOR CUFF REPAIR Bilateral   . 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   Procedure: LEFT TOTAL KNEE ARTHROPLASTY;  Surgeon: Paralee Cancel, MD;  Location: [facility];  Service: Orthopedics;  Laterality: Left;  70 mins with block    There were no vitals filed for this visit.   Subjective Assessment - 08/16/20 0850    Subjective The breathing technique is helping me push the stool out. No prolapse.    Patient Stated Goals reduce the prolapse and improve toileting techniques     Currently in Pain? No/denies              [facility] PT Assessment - 08/16/20 0001      Assessment   Medical Diagnosis K62.3 Rectal prolapse; K59.09 Chronic constipation    Referring Provider (PT) Dr. Silvano Rusk    Onset Date/Surgical Date --   chronic   Prior Therapy none      Precautions   Precautions None      Restrictions   Weight Bearing Restrictions No      Prior Function   Vocation Retired    Leisure play golf, tennis, bridge      Cognition   Overall Cognitive Status Within Functional Limits for tasks assessed      AROM   Lumbar Extension decreased by 25% with no pain    Lumbar - Right Side Bend decreased by 25% with pain      Strength   Right Hip Flexion 5/5    Left Hip Flexion 5/5                      Pelvic Floor Special Questions - 08/16/20 0001    Strength fair squeeze, definite lift  [place] Adult PT Treatment/Exercise - 08/16/20 0001      Self-Care   Self-Care Other Self-Care Comments    Other Self-Care Comments  discussed with patient on finishing his bowel movement. He is able to have a bowel movement in 90 seconds but feels like he has to go again in several hours to 12 hours. Let patient know this is normal and showed ways that he can try to relax the pelvic floor and move the stool through the intestines to help. Discussed his diet and how magnesium nitrate helps with constipation      Lumbar Exercises: Stretches   Other Lumbar Stretch Exercise supine with feet on wall like a squat position and doing diaphragmatic breathing and abdominal massage to elongate the pelvic floor                  PT Education - 08/16/20 0918    Education Details reviewed toileting and ways to assist in producing a bowel movement fully and not waiting several hours to 12 hours    Person(s) Educated Patient    Methods Explanation    Comprehension Verbalized understanding            PT Short Term Goals - 08/16/20 0851      PT  SHORT TERM GOAL #1   Title able to demonstrate abdominal massage to improve peristalic motion of the intestines    Time 4    Period Weeks    Status Achieved    Target Date 06/08/20      PT SHORT TERM GOAL #2   Title able to demonstrate correct diaphgramatic breathing with opening of the lower rib cage then the pelvic floor    Time 4    Period Weeks    Status Achieved             PT Long Term Goals - 08/16/20 7494      PT LONG TERM GOAL #1   Title independent with advanced HEP    Period Weeks    Status Achieved      PT LONG TERM GOAL #2   Title able to feel the urge to have a bowel movement increased >/= 50%    Time 8    Period Weeks    Status Achieved      PT LONG TERM GOAL #3   Title able to push his bowel movements with >/= 50% greater ease and only sitting on the commode for 10 minutes or less    Time 8    Period Weeks    Status Achieved      PT LONG TERM GOAL #4   Title rectal prolapse reduced by 50% of the time due to improve bowel health and toileting    Time 8    Period Weeks    Status Achieved                 Plan - 08/16/20 0920    Clinical Impression Statement Patient report she is able to have a bowel movement without straining in 90 seconds. There are times he will have a bowel movement in 2 hours to 12 hours. Patient reports he rarely has a rectum prolpase. Patient is doing his breathing technique and toileting which is helping him. Patient feels ready for discharge and consistent with his HEP. He has increase hip flexion strength. He has limitations in lumbar ROM.    Personal Factors and Comorbidities Comorbidity 2    Comorbidities rectal prolapse; bilateral inguinal  hernia repair    Examination-Activity Limitations Toileting    Stability/Clinical Decision Making Stable/Uncomplicated    Rehab Potential Excellent    PT Frequency One time visit   for renewal/discharge   PT Treatment/Interventions ADLs/Self Care Home  Management;Biofeedback;Neuromuscular re-education;Therapeutic exercise;Therapeutic activities;Patient/family education;Manual techniques;Spinal Manipulations    PT Next Visit Plan Discharge to HEP    PT Home Exercise Plan Access Code: [email or web address]    Recommended Other Services sent MD renewal/discharge note    Consulted and Agree with Plan of Care Patient           Patient will benefit from skilled therapeutic intervention in order to improve the following deficits and impairments:  Decreased coordination,Increased fascial restricitons,Pain,Decreased strength,Decreased mobility,Decreased activity tolerance  Visit Diagnosis: Muscle weakness (generalized) - Plan: PT plan of care cert/re-cert  Other lack of coordination - Plan: PT plan of care cert/re-cert  Rectal prolapse - Plan: PT plan of care cert/re-cert     Problem List Patient Active Problem List   Diagnosis Date Noted  . 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 Abnormal EKG 02/26/2017  . Dyslipidemia 02/26/2017  . Elevated blood pressure reading in office without diagnosis of hypertension 02/26/2017  . Preoperative cardiovascular examination 02/26/2017  . HERPES SIMPLEX INFECTION, RECURRENT 08/26/2008  . ANEMIA, IRON DEFICIENCY 08/26/2008  . DIVERTICULOSIS, COLON, HX OF 08/26/2008  . INGUINAL HERNIORRHAPHY, HX OF 07/12/2004    Earlie Counts, PT 08/16/20 9:27 AM   [facility] [address] [place], [place], [address] Phone: [phone]   Fax:  [phone]  Name: Charles Norton MRN: [id number] Date of Birth: [birth year]  PHYSICAL THERAPY DISCHARGE SUMMARY  Visits from Start of Care: 4  Current functional level related to goals / functional outcomes: See above.   Remaining deficits: See above.    Education / Equipment: HEP Plan: Patient agrees to discharge.  Patient goals were met. Patient is being discharged due to meeting  the stated rehab goals.  Thank you for the referral. Earlie Counts, PT 08/16/20 9:27 AM  ?????

## 2020-08-23 ENCOUNTER — Ambulatory Visit: Payer: Medicare Other | Admitting: Internal Medicine

## 2020-08-23 ENCOUNTER — Encounter: Payer: Self-pay | Admitting: Internal Medicine

## 2020-08-23 VITALS — BP 122/70 | HR 64 | Ht 68.25 in | Wt 159.4 lb

## 2020-08-23 DIAGNOSIS — K623 Rectal prolapse: Secondary | ICD-10-CM | POA: Diagnosis not present

## 2020-08-23 DIAGNOSIS — R198 Other specified symptoms and signs involving the digestive system and abdomen: Secondary | ICD-10-CM | POA: Diagnosis not present

## 2020-08-23 DIAGNOSIS — K5909 Other constipation: Secondary | ICD-10-CM

## 2020-08-23 NOTE — Patient Instructions (Signed)
Glad your doing well.  Call us if your symptoms return.  Have a wonderful trip to [place].   I appreciate the opportunity to care for you. Silvano Rusk, MD, [facility]

## 2020-08-23 NOTE — Progress Notes (Signed)
Charles Norton 80 y.o. 21-Apr-1941 [id number]  Assessment & Plan:   Encounter Diagnoses  Name Primary?  . Chronic constipation Yes  . Abnormal defecation   . Rectal prolapse ?     All problems much improved.  He and I are both very appreciative of the excellent physical therapy provided by Earlie Counts.  He knows to contact me if he has recurrent problems he will be followed up on an as-needed basis.  Continue with home exercise plan.  I appreciate the opportunity to care for this patient. CC: Carol Ada, MD Earlie Counts, PT  Subjective:   Chief Complaint: Rectal prolapse and constipation follow-up  HPI Charles Norton is a 80 year old white man who came to me in December 2021 with a question of rectal prolapse, biopsies suggested this on previous colonoscopy exam.  I referred him for pelvic floor physical therapy which she was able to attend when he returned from his 40-month trip to [facility].  He reports that his symptoms of prolapse and chronic constipation are much better and he is very pleased with the results. Allergies  Allergen Reactions  . Other Nausea And Vomiting    All pain medications.  Can tolerate if anti-nausea medication first    Current Meds  Medication Sig  . Ascorbic Acid (VITAMIN C) 1000 MG tablet Take 1,000 mg by mouth daily.  . Cholecalciferol (VITAMIN D3) 50 MCG (2000 UT) TABS Take 1 tablet by mouth daily.  . Glucosamine-Chondroitin (GLUCOSAMINE CHONDR COMPLEX PO) Take 1,500 mg by mouth daily.   Marland Kitchen losartan (COZAAR) 50 MG tablet Take 50 mg by mouth daily.  . magnesium oxide (MAG-OX) 400 MG tablet Take 400 mg by mouth daily.  . Multiple Vitamin (MULTI VITAMIN DAILY PO) Take 1 tablet by mouth daily.   . Omega-3 Fatty Acids (FISH OIL) 1000 MG CAPS Take 1 capsule by mouth daily.  . Potassium 99 MG TABS Take 99 mg by mouth daily.   . rosuvastatin (CRESTOR) 5 MG tablet Take 5 mg by mouth daily.  Marland Kitchen Specialty Vitamins Products (ICAPS LUTEIN-ZEAXANTHIN PO)  Take 1 tablet by mouth daily.  . tamsulosin (FLOMAX) 0.4 MG CAPS capsule Take 0.4 mg by mouth daily.  . Turmeric 500 MG CAPS Take 1,000 mg by mouth daily.   Past Medical History:  Diagnosis Date  . Adenomatous colon polyp   . Anemia   . Arthritis   . Basal cell carcinoma   . BPH (benign prostatic hyperplasia)   . Cataract of left eye    due to trauma in left eye  . Diverticulosis    in sigmoid colon and in the descending colon  . Hemorrhoids   . Hypercholesterolemia   . Hypertension   . Iron deficiency    anemia  . Lichen planus    of tongue  . Osteoarthritis   . PONV (postoperative nausea and vomiting)   . Rectal prolapse   . Shingles    right ophthalmic involvement  . Tinnitus    Past Surgical History:  Procedure Laterality Date  . COLONOSCOPY     2010 and 2020  . INGUINAL HERNIA REPAIR Bilateral   . KNEE SURGERY     arthrosocopy   . removal of cyst     sphenoid cyst   . ROTATOR CUFF REPAIR Bilateral   . 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   Procedure: LEFT TOTAL KNEE ARTHROPLASTY;  Surgeon: Paralee Cancel, MD;  Location: [facility];  Service: Orthopedics;  Laterality: Left;  70 mins  with block   Social History   Social History Narrative   Married no children retired from [employer]   2 alcoholic drinks a day 2 caffeinated beverages a day never smoker no drug use   family history includes AAA (abdominal aortic aneurysm) in his father; Heart disease in his father; Hypertension in his father; Obesity in his brother; Tuberculosis in his mother.   Review of Systems As per HPI  Objective:   Physical Exam BP 122/70   Pulse 64   Ht 5' 8.25" (1.734 m)   Wt 159 lb 6.4 oz (72.3 kg)   SpO2 99%   BMI 24.06 kg/m

## 2020-08-30 DIAGNOSIS — R059 Cough, unspecified: Secondary | ICD-10-CM | POA: Diagnosis not present

## 2020-09-20 DIAGNOSIS — H524 Presbyopia: Secondary | ICD-10-CM | POA: Diagnosis not present

## 2020-09-26 DIAGNOSIS — D1801 Hemangioma of skin and subcutaneous tissue: Secondary | ICD-10-CM | POA: Diagnosis not present

## 2020-09-26 DIAGNOSIS — Z85828 Personal history of other malignant neoplasm of skin: Secondary | ICD-10-CM | POA: Diagnosis not present

## 2020-09-26 DIAGNOSIS — L821 Other seborrheic keratosis: Secondary | ICD-10-CM | POA: Diagnosis not present

## 2020-09-26 DIAGNOSIS — D225 Melanocytic nevi of trunk: Secondary | ICD-10-CM | POA: Diagnosis not present

## 2020-11-11 DIAGNOSIS — R1030 Lower abdominal pain, unspecified: Secondary | ICD-10-CM | POA: Diagnosis not present

## 2020-11-11 DIAGNOSIS — R300 Dysuria: Secondary | ICD-10-CM | POA: Diagnosis not present

## 2020-12-25 DIAGNOSIS — E785 Hyperlipidemia, unspecified: Secondary | ICD-10-CM | POA: Diagnosis not present

## 2020-12-25 DIAGNOSIS — Z1389 Encounter for screening for other disorder: Secondary | ICD-10-CM | POA: Diagnosis not present

## 2020-12-25 DIAGNOSIS — I10 Essential (primary) hypertension: Secondary | ICD-10-CM | POA: Diagnosis not present

## 2020-12-25 DIAGNOSIS — Z0000 Encounter for general adult medical examination without abnormal findings: Secondary | ICD-10-CM | POA: Diagnosis not present

## 2020-12-25 DIAGNOSIS — Z125 Encounter for screening for malignant neoplasm of prostate: Secondary | ICD-10-CM | POA: Diagnosis not present

## 2022-06-10 ENCOUNTER — Ambulatory Visit: Payer: Medicare Other | Admitting: Internal Medicine

## 2022-09-16 ENCOUNTER — Emergency Department (HOSPITAL_COMMUNITY): Payer: Medicare Other

## 2022-09-16 ENCOUNTER — Inpatient Hospital Stay (HOSPITAL_COMMUNITY)
Admission: EM | Admit: 2022-09-16 | Discharge: 2022-09-18 | DRG: 683 | Disposition: A | Discharge status: Home Health | Payer: Medicare Other | Attending: Internal Medicine | Admitting: Internal Medicine

## 2022-09-16 ENCOUNTER — Other Ambulatory Visit: Payer: Self-pay

## 2022-09-16 ENCOUNTER — Encounter (HOSPITAL_COMMUNITY): Payer: Self-pay

## 2022-09-16 DIAGNOSIS — I951 Orthostatic hypotension: Secondary | ICD-10-CM | POA: Diagnosis present

## 2022-09-16 DIAGNOSIS — Z8249 Family history of ischemic heart disease and other diseases of the circulatory system: Secondary | ICD-10-CM

## 2022-09-16 DIAGNOSIS — N179 Acute kidney failure, unspecified: Secondary | ICD-10-CM | POA: Diagnosis not present

## 2022-09-16 DIAGNOSIS — Y831 Surgical operation with implant of artificial internal device as the cause of abnormal reaction of the patient, or of later complication, without mention of misadventure at the time of the procedure: Secondary | ICD-10-CM | POA: Diagnosis present

## 2022-09-16 DIAGNOSIS — Z79899 Other long term (current) drug therapy: Secondary | ICD-10-CM

## 2022-09-16 DIAGNOSIS — Z85828 Personal history of other malignant neoplasm of skin: Secondary | ICD-10-CM

## 2022-09-16 DIAGNOSIS — N400 Enlarged prostate without lower urinary tract symptoms: Secondary | ICD-10-CM | POA: Diagnosis present

## 2022-09-16 DIAGNOSIS — E860 Dehydration: Secondary | ICD-10-CM | POA: Diagnosis present

## 2022-09-16 DIAGNOSIS — R55 Syncope and collapse: Secondary | ICD-10-CM

## 2022-09-16 DIAGNOSIS — M25462 Effusion, left knee: Secondary | ICD-10-CM | POA: Diagnosis present

## 2022-09-16 DIAGNOSIS — R6510 Systemic inflammatory response syndrome (SIRS) of non-infectious origin without acute organ dysfunction: Secondary | ICD-10-CM | POA: Diagnosis present

## 2022-09-16 DIAGNOSIS — I4510 Unspecified right bundle-branch block: Secondary | ICD-10-CM | POA: Diagnosis present

## 2022-09-16 DIAGNOSIS — M25562 Pain in left knee: Secondary | ICD-10-CM | POA: Diagnosis present

## 2022-09-16 DIAGNOSIS — E785 Hyperlipidemia, unspecified: Secondary | ICD-10-CM | POA: Diagnosis present

## 2022-09-16 DIAGNOSIS — T8454XA Infection and inflammatory reaction due to internal left knee prosthesis, initial encounter: Secondary | ICD-10-CM | POA: Diagnosis present

## 2022-09-16 DIAGNOSIS — I10 Essential (primary) hypertension: Secondary | ICD-10-CM | POA: Diagnosis present

## 2022-09-16 DIAGNOSIS — R531 Weakness: Secondary | ICD-10-CM

## 2022-09-16 DIAGNOSIS — E7800 Pure hypercholesterolemia, unspecified: Secondary | ICD-10-CM | POA: Diagnosis present

## 2022-09-16 DIAGNOSIS — Z1152 Encounter for screening for COVID-19: Secondary | ICD-10-CM

## 2022-09-16 LAB — COMPREHENSIVE METABOLIC PANEL
ALT: 17 U/L (ref 0–44)
AST: 26 U/L (ref 15–41)
Albumin: 3.5 g/dL (ref 3.5–5.0)
Alkaline Phosphatase: 42 U/L (ref 38–126)
Anion gap: 9 (ref 5–15)
BUN: 17 mg/dL (ref 8–23)
CO2: 21 mmol/L — ABNORMAL LOW (ref 22–32)
Calcium: 9 mg/dL (ref 8.9–10.3)
Chloride: 108 mmol/L (ref 98–111)
Creatinine, Ser: 1.66 mg/dL — ABNORMAL HIGH (ref 0.61–1.24)
GFR, Estimated: 42 mL/min — ABNORMAL LOW (ref >60)
Glucose, Bld: 99 mg/dL (ref 70–99)
Potassium: 4.2 mmol/L (ref 3.5–5.1)
Sodium: 138 mmol/L (ref 135–145)
Total Bilirubin: 1.1 mg/dL (ref 0.3–1.2)
Total Protein: 5.6 g/dL — ABNORMAL LOW (ref 6.5–8.1)

## 2022-09-16 LAB — CBC WITH DIFFERENTIAL/PLATELET
Abs Immature Granulocytes: 0.12 10*3/uL — ABNORMAL HIGH (ref 0.00–0.07)
Basophils Absolute: 0 10*3/uL (ref 0.0–0.1)
Basophils Relative: 0 %
Eosinophils Absolute: 0 10*3/uL (ref 0.0–0.5)
Eosinophils Relative: 0 %
HCT: 36.1 % — ABNORMAL LOW (ref 39.0–52.0)
Hemoglobin: 12.2 g/dL — ABNORMAL LOW (ref 13.0–17.0)
Immature Granulocytes: 1 %
Lymphocytes Relative: 1 %
Lymphs Abs: 0.2 10*3/uL — ABNORMAL LOW (ref 0.7–4.0)
MCH: 32.7 pg (ref 26.0–34.0)
MCHC: 33.8 g/dL (ref 30.0–36.0)
MCV: 96.8 fL (ref 80.0–100.0)
Monocytes Absolute: 0.4 10*3/uL (ref 0.1–1.0)
Monocytes Relative: 3 %
Neutro Abs: 14.1 10*3/uL — ABNORMAL HIGH (ref 1.7–7.7)
Neutrophils Relative %: 95 %
Platelets: 197 10*3/uL (ref 150–400)
RBC: 3.73 MIL/uL — ABNORMAL LOW (ref 4.22–5.81)
RDW: 13.6 % (ref 11.5–15.5)
WBC: 14.9 10*3/uL — ABNORMAL HIGH (ref 4.0–10.5)
nRBC: 0 % (ref 0.0–0.2)

## 2022-09-16 LAB — URINALYSIS, ROUTINE W REFLEX MICROSCOPIC
Bilirubin Urine: NEGATIVE
Glucose, UA: NEGATIVE mg/dL
Hgb urine dipstick: NEGATIVE
Ketones, ur: NEGATIVE mg/dL
Leukocytes,Ua: NEGATIVE
Nitrite: NEGATIVE
Protein, ur: NEGATIVE mg/dL
Specific Gravity, Urine: 1.018 (ref 1.005–1.030)
pH: 6 (ref 5.0–8.0)

## 2022-09-16 LAB — RESP PANEL BY RT-PCR (RSV, FLU A&B, COVID)  RVPGX2
Influenza A by PCR: NEGATIVE
Influenza B by PCR: NEGATIVE
Resp Syncytial Virus by PCR: NEGATIVE
SARS Coronavirus 2 by RT PCR: NEGATIVE

## 2022-09-16 LAB — TROPONIN I (HIGH SENSITIVITY)
Troponin I (High Sensitivity): 5 ng/L (ref <18)
Troponin I (High Sensitivity): 5 ng/L (ref <18)

## 2022-09-16 LAB — CBG MONITORING, ED: Glucose-Capillary: 99 mg/dL (ref 70–99)

## 2022-09-16 LAB — LIPASE, BLOOD: Lipase: 25 U/L (ref 11–51)

## 2022-09-16 MED ORDER — SODIUM CHLORIDE 0.9 % IV BOLUS
500.0000 mL | Freq: Once | INTRAVENOUS | Status: AC
  Administered 2022-09-16: 500 mL via INTRAVENOUS

## 2022-09-16 MED ORDER — ONDANSETRON HCL 4 MG/2ML IJ SOLN
4.0000 mg | Freq: Once | INTRAMUSCULAR | Status: AC
  Administered 2022-09-16: 4 mg via INTRAVENOUS
  Filled 2022-09-16: qty 2

## 2022-09-16 MED ORDER — TRAMADOL HCL 50 MG PO TABS
50.0000 mg | ORAL_TABLET | Freq: Once | ORAL | Status: AC
  Administered 2022-09-16: 50 mg via ORAL
  Filled 2022-09-16: qty 1

## 2022-09-16 MED ORDER — SODIUM CHLORIDE 0.9 % IV BOLUS
1000.0000 mL | Freq: Once | INTRAVENOUS | Status: AC
  Administered 2022-09-16: 1000 mL via INTRAVENOUS

## 2022-09-16 MED ORDER — LIDOCAINE-EPINEPHRINE 1 %-1:100000 IJ SOLN
20.0000 mL | Freq: Once | INTRAMUSCULAR | Status: AC
  Administered 2022-09-16: 20 mL
  Filled 2022-09-16: qty 1

## 2022-09-16 MED ORDER — IBUPROFEN 800 MG PO TABS
800.0000 mg | ORAL_TABLET | Freq: Once | ORAL | Status: AC
  Administered 2022-09-16: 800 mg via ORAL
  Filled 2022-09-16: qty 1

## 2022-09-16 MED ORDER — FENTANYL CITRATE PF 50 MCG/ML IJ SOSY: 50.0000 ug | PREFILLED_SYRINGE | Freq: Once | INTRAMUSCULAR | Status: DC

## 2022-09-16 NOTE — ED Notes (Signed)
Cbg was 99

## 2022-09-16 NOTE — ED Provider Notes (Signed)
[place] EMERGENCY DEPARTMENT AT [facility] Provider Note   CSN: [id number] Arrival date & time: 09/16/22  1644     History  Chief Complaint  Patient presents with   Knee Pain   Near Syncope    Charles Norton is a 82 y.o. male with a past medical history of dyslipidemia and hypertension presenting today due to potential near syncopal episode.  Patient tells me that today he played tennis and on his second mat she started to feel nauseated.  When he got home he was feeling severe pain in his left knee that previously had a total knee replacement.  He went to lay down because he started to have body aches and chills.  His wife went to the store to buy Tylenol, ibuprofen and cold and flu medications because he was convinced that he must have the flu and he took a COVID test at home which was negative.  Wife says that while they were sitting on the couch she continued to say he did not feel well and is being heard and all of a sudden his head his eyes were rolling around.  She says that "the area around his lips looked very blue."  She subsequently called a neighbor who told her to call 911.  Patient has no cardiac history.  Reports pain in his knee but no other complaints    Knee Pain Associated symptoms: fever   Near Syncope Pertinent negatives include no chest pain.       Home Medications Prior to Admission medications   Medication Sig Start Date End Date Taking? Authorizing Provider  Ascorbic Acid (VITAMIN C) 1000 MG tablet Take 1,000 mg by mouth daily.    [provider]  Cholecalciferol (VITAMIN D3) 50 MCG (2000 UT) TABS Take 1 tablet by mouth daily.    [provider]  Glucosamine-Chondroitin (GLUCOSAMINE CHONDR COMPLEX PO) Take 1,500 mg by mouth daily.     [provider]  losartan (COZAAR) 50 MG tablet Take 50 mg by mouth daily. 03/12/20   [provider]  magnesium oxide (MAG-OX) 400 MG tablet Take 400 mg by mouth daily.     [provider]  Multiple Vitamin (MULTI VITAMIN DAILY PO) Take 1 tablet by mouth daily.     [provider]  Omega-3 Fatty Acids (FISH OIL) 1000 MG CAPS Take 1 capsule by mouth daily.    [provider]  Potassium 99 MG TABS Take 99 mg by mouth daily.     [provider]  rosuvastatin (CRESTOR) 5 MG tablet Take 5 mg by mouth daily. 03/12/20   [provider]  Specialty Vitamins Products (ICAPS LUTEIN-ZEAXANTHIN PO) Take 1 tablet by mouth daily.    [provider]  tamsulosin (FLOMAX) 0.4 MG CAPS capsule Take 0.4 mg by mouth daily. 03/12/20   [provider]  Turmeric 500 MG CAPS Take 1,000 mg by mouth daily.    [provider]      Allergies    Other    Review of Systems   Review of Systems  Constitutional:  Positive for chills and fever.  Respiratory:  Negative for cough and chest tightness.   Cardiovascular:  Positive for near-syncope. Negative for chest pain and palpitations.  Gastrointestinal:  Positive for nausea. Negative for diarrhea and vomiting.  Musculoskeletal:  Positive for myalgias.  Neurological:  Positive for weakness. Negative for seizures, speech difficulty, light-headedness and numbness.    Physical Exam Updated Vital Signs There were  no vitals taken for this visit. Physical Exam Vitals and nursing note reviewed.  Constitutional:      General: He is not in acute distress.    Appearance: Normal appearance. He is not ill-appearing.  HENT:     Head: Normocephalic and atraumatic.  Eyes:     General: No scleral icterus.    Conjunctiva/sclera: Conjunctivae normal.  Cardiovascular:     Rate and Rhythm: Normal rate and regular rhythm.  Pulmonary:     Effort: Pulmonary effort is normal. No respiratory distress.     Breath sounds: No wheezing or rales.  Abdominal:     General: Abdomen is flat.     Palpations: Abdomen is soft.     Tenderness: There is no abdominal tenderness.  Musculoskeletal:         General: Tenderness (L knee) present. No swelling.     Right lower leg: No edema.     Left lower leg: No edema.  Skin:    Findings: No rash.  Neurological:     Mental Status: He is alert.  Psychiatric:        Mood and Affect: Mood normal.     ED Results / Procedures / Treatments   Labs (all labs ordered are listed, but only abnormal results are displayed) Labs Reviewed  CBC WITH DIFFERENTIAL/PLATELET - Abnormal; Notable for the following components:      Result Value   WBC 14.9 (*)    RBC 3.73 (*)    Hemoglobin 12.2 (*)    HCT 36.1 (*)    Neutro Abs 14.1 (*)    Lymphs Abs 0.2 (*)    Abs Immature Granulocytes 0.12 (*)    All other components within normal limits  COMPREHENSIVE METABOLIC PANEL - Abnormal; Notable for the following components:   CO2 21 (*)    Creatinine, Ser 1.66 (*)    Total Protein 5.6 (*)    GFR, Estimated 42 (*)    All other components within normal limits  RESP PANEL BY RT-PCR (RSV, FLU A&B, COVID)  RVPGX2  LIPASE, BLOOD  URINALYSIS, ROUTINE W REFLEX MICROSCOPIC  CBG MONITORING, ED  TROPONIN I (HIGH SENSITIVITY)  TROPONIN I (HIGH SENSITIVITY)    EKG None  Radiology DG Knee Left Port  Result Date: 09/16/2022 CLINICAL DATA:  Pain EXAM: PORTABLE LEFT KNEE - 2 VIEW COMPARISON:  None Available. FINDINGS: Portable x-rays demonstrates total knee arthroplasty with cemented femoral and tibial component. Patellar button. Expected alignment. No hardware failure. Osteopenia. Soft tissue calcifications are seen surrounding the knee. No joint effusion. IMPRESSION: Surgical changes of total knee arthroplasty. Electronically Signed   By: Karen Kays M.D.   On: 09/16/2022 17:58    Procedures Procedures   Medications Ordered in ED Medications - No data to display  ED Course/ Medical Decision Making/ A&P Clinical Course as of 09/16/22 1904  Tue Sep 16, 2022  1903 Creatinine(!): 1.66 Baseline around 1.0 [SG]  1903 AKI, orthostatic. Had syncope  earlier today [SG]    Clinical Course User Index [SG] Sloan Leiter, DO                             Medical Decision Making Amount and/or Complexity of Data Reviewed Labs: ordered. Decision-making details documented in ED Course. Radiology: ordered. ECG/medicine tests: ordered.  Risk Prescription drug management.   Past Medical History / Co-morbidities / Social History: Hypertension, hyperlipidemia   Additional history: Per chart review patient was in  physical therapy in 2022 due to muscle weakness   Physical Exam: Pertinent physical exam findings include Nurse did orthostatics that were positive, hypotensive to 80s over 40s when stood up  Lab Tests: I ordered, and personally interpreted labs.  The pertinent results include: 14.9 WBC Negative troponin Creatinine 1.66, baseline normal around 1   Imaging Studies: Knee x-ray negative   Cardiac Monitoring:  The patient was maintained on a cardiac monitor.  I viewed and interpreted the cardiac monitored which showed an underlying rhythm of: Sinus   Medications: As for dehydration, tramadol and Zofran for pain and nausea   MDM/Disposition: This is a 82 year old gentleman who presented today with a questionable near syncopal episode.  Complained of flulike symptoms that started earlier today.  He was also having knee pain.  While he was talking to his wife he became nauseated and she reported that his head rolled back and he stopped responding for few seconds.  Patient tells me the only thing bothering him is his knee and occasional body aches.  Borderline febrile and tachycardic complaining of flulike symptoms.  Question COVID/flu.  Additionally, patient has soft blood pressures and is overtly hypotensive when standing.  He also has an AKI.  Suspect he is somewhat dehydrated contributing to his picture today.  Spent a long time doing yard work in the [employer] yesterday as well as golf today.  At this time patient's COVID and flu  testing is pending.  He does have a leukocytosis to 15, potentially in the setting of viral illness.  Also need a urine sample to exclude UTI.  Patient signed out to Dr. Wallace Cullens.  He will decide the ultimate disposition of the patient after viral swab, urinalysis and chest x-ray come back.   Final Clinical Impression(s) / ED Diagnoses Final diagnoses:  None    Rx / DC Orders ED Discharge Orders     None         Adrin Julian A, PA-C 09/16/22 1908    Sloan Leiter, DO 09/17/22 0004

## 2022-09-16 NOTE — ED Triage Notes (Signed)
Patient was bib [facility] from home with complaints of knee pain and also a near syncopal episode. EMS reports patient was sitting on the couch with his with and he got very pale and diaphoretic and stopped talking. Patient returned to baseline quickly and states his only  complaint is knee pain. EMS states that he had some orthostatic hypotension with them and he also had a vomiting episode with them also. Patient reports he was playing tennis today and did some extra yard work yesterday but has been drinking and eating as he normally does.

## 2022-09-16 NOTE — ED Provider Notes (Signed)
.  Joint Aspiration/Arthrocentesis  Date/Time: 09/16/2022 11:22 PM  Performed by: Sloan Leiter, DO Authorized by: Sloan Leiter, DO   Consent:    Consent obtained:  Verbal   Consent given by:  Patient   Risks, benefits, and alternatives were discussed: yes     Risks discussed:  Bleeding, infection, pain and incomplete drainage   Alternatives discussed:  No treatment, delayed treatment and alternative treatment Universal protocol:    Procedure explained and questions answered to patient or proxy's satisfaction: yes     Immediately prior to procedure, a time out was called: yes     Patient identity confirmed:  Arm band and verbally with patient Location:    Location:  Knee   Knee:  L knee Anesthesia:    Anesthesia method:  Topical application and local infiltration   Local anesthetic:  Lidocaine 1% WITH epi Procedure details:    Preparation: Patient was prepped and draped in usual sterile fashion     Needle gauge:  18 G   Approach:  Lateral   Aspirate amount:  20   Aspirate characteristics:  Purulent, serous and blood-tinged   Steroid injected: no     Specimen collected: yes   Post-procedure details:    Dressing:  Adhesive bandage   Procedure completion:  Tolerated well, no immediate complications     Sloan Leiter, DO 09/16/22 2323

## 2022-09-16 NOTE — ED Notes (Signed)
X-ray at bedside

## 2022-09-17 ENCOUNTER — Inpatient Hospital Stay (HOSPITAL_COMMUNITY): Payer: Medicare Other

## 2022-09-17 ENCOUNTER — Encounter (HOSPITAL_COMMUNITY): Payer: Self-pay | Admitting: Internal Medicine

## 2022-09-17 DIAGNOSIS — Z79899 Other long term (current) drug therapy: Secondary | ICD-10-CM | POA: Diagnosis not present

## 2022-09-17 DIAGNOSIS — E860 Dehydration: Secondary | ICD-10-CM

## 2022-09-17 DIAGNOSIS — I951 Orthostatic hypotension: Secondary | ICD-10-CM | POA: Diagnosis present

## 2022-09-17 DIAGNOSIS — T8454XA Infection and inflammatory reaction due to internal left knee prosthesis, initial encounter: Secondary | ICD-10-CM | POA: Diagnosis present

## 2022-09-17 DIAGNOSIS — Z8249 Family history of ischemic heart disease and other diseases of the circulatory system: Secondary | ICD-10-CM | POA: Diagnosis not present

## 2022-09-17 DIAGNOSIS — N179 Acute kidney failure, unspecified: Principal | ICD-10-CM | POA: Diagnosis present

## 2022-09-17 DIAGNOSIS — Z85828 Personal history of other malignant neoplasm of skin: Secondary | ICD-10-CM | POA: Diagnosis not present

## 2022-09-17 DIAGNOSIS — R531 Weakness: Secondary | ICD-10-CM

## 2022-09-17 DIAGNOSIS — E782 Mixed hyperlipidemia: Secondary | ICD-10-CM

## 2022-09-17 DIAGNOSIS — N400 Enlarged prostate without lower urinary tract symptoms: Secondary | ICD-10-CM | POA: Diagnosis present

## 2022-09-17 DIAGNOSIS — M25462 Effusion, left knee: Secondary | ICD-10-CM

## 2022-09-17 DIAGNOSIS — R6510 Systemic inflammatory response syndrome (SIRS) of non-infectious origin without acute organ dysfunction: Secondary | ICD-10-CM

## 2022-09-17 DIAGNOSIS — Y831 Surgical operation with implant of artificial internal device as the cause of abnormal reaction of the patient, or of later complication, without mention of misadventure at the time of the procedure: Secondary | ICD-10-CM | POA: Diagnosis present

## 2022-09-17 DIAGNOSIS — I10 Essential (primary) hypertension: Secondary | ICD-10-CM | POA: Diagnosis present

## 2022-09-17 DIAGNOSIS — E7800 Pure hypercholesterolemia, unspecified: Secondary | ICD-10-CM | POA: Diagnosis present

## 2022-09-17 DIAGNOSIS — M25562 Pain in left knee: Secondary | ICD-10-CM | POA: Diagnosis not present

## 2022-09-17 DIAGNOSIS — I4510 Unspecified right bundle-branch block: Secondary | ICD-10-CM | POA: Diagnosis present

## 2022-09-17 DIAGNOSIS — Z1152 Encounter for screening for COVID-19: Secondary | ICD-10-CM | POA: Diagnosis not present

## 2022-09-17 LAB — CBC WITH DIFFERENTIAL/PLATELET
Abs Immature Granulocytes: 0 10*3/uL (ref 0.00–0.07)
Basophils Absolute: 0 10*3/uL (ref 0.0–0.1)
Basophils Relative: 0 %
Eosinophils Absolute: 0 10*3/uL (ref 0.0–0.5)
Eosinophils Relative: 0 %
HCT: 31.4 % — ABNORMAL LOW (ref 39.0–52.0)
Hemoglobin: 10.5 g/dL — ABNORMAL LOW (ref 13.0–17.0)
Lymphocytes Relative: 3 %
Lymphs Abs: 0.4 10*3/uL — ABNORMAL LOW (ref 0.7–4.0)
MCH: 32.4 pg (ref 26.0–34.0)
MCHC: 33.4 g/dL (ref 30.0–36.0)
MCV: 96.9 fL (ref 80.0–100.0)
Monocytes Absolute: 0 10*3/uL — ABNORMAL LOW (ref 0.1–1.0)
Monocytes Relative: 0 %
Neutro Abs: 12 10*3/uL — ABNORMAL HIGH (ref 1.7–7.7)
Neutrophils Relative %: 97 %
Platelets: 143 10*3/uL — ABNORMAL LOW (ref 150–400)
RBC: 3.24 MIL/uL — ABNORMAL LOW (ref 4.22–5.81)
RDW: 13.7 % (ref 11.5–15.5)
WBC: 12.4 10*3/uL — ABNORMAL HIGH (ref 4.0–10.5)
nRBC: 0 % (ref 0.0–0.2)
nRBC: 0 /100 WBC

## 2022-09-17 LAB — COMPREHENSIVE METABOLIC PANEL
ALT: 21 U/L (ref 0–44)
AST: 34 U/L (ref 15–41)
Albumin: 2.9 g/dL — ABNORMAL LOW (ref 3.5–5.0)
Alkaline Phosphatase: 35 U/L — ABNORMAL LOW (ref 38–126)
Anion gap: 10 (ref 5–15)
BUN: 19 mg/dL (ref 8–23)
CO2: 19 mmol/L — ABNORMAL LOW (ref 22–32)
Calcium: 8.3 mg/dL — ABNORMAL LOW (ref 8.9–10.3)
Chloride: 106 mmol/L (ref 98–111)
Creatinine, Ser: 1.49 mg/dL — ABNORMAL HIGH (ref 0.61–1.24)
GFR, Estimated: 47 mL/min — ABNORMAL LOW (ref >60)
Glucose, Bld: 107 mg/dL — ABNORMAL HIGH (ref 70–99)
Potassium: 4.3 mmol/L (ref 3.5–5.1)
Sodium: 135 mmol/L (ref 135–145)
Total Bilirubin: 0.9 mg/dL (ref 0.3–1.2)
Total Protein: 4.6 g/dL — ABNORMAL LOW (ref 6.5–8.1)

## 2022-09-17 LAB — BODY FLUID CULTURE W GRAM STAIN

## 2022-09-17 LAB — PROTIME-INR
INR: 1.2 (ref 0.8–1.2)
Prothrombin Time: 15.3 seconds — ABNORMAL HIGH (ref 11.4–15.2)

## 2022-09-17 LAB — SYNOVIAL CELL COUNT + DIFF, W/ CRYSTALS
Eosinophils-Synovial: 0 % (ref 0–1)
Lymphocytes-Synovial Fld: 1 % (ref 0–20)
Monocyte-Macrophage-Synovial Fluid: 8 % — ABNORMAL LOW (ref 50–90)
Neutrophil, Synovial: 91 % — ABNORMAL HIGH (ref 0–25)

## 2022-09-17 LAB — PROCALCITONIN: Procalcitonin: <0.1 ng/mL

## 2022-09-17 LAB — SEDIMENTATION RATE: Sed Rate: 5 mm/hr (ref 0–16)

## 2022-09-17 LAB — PROTEIN, PLEURAL OR PERITONEAL FLUID: Total protein, fluid: 3.6 g/dL

## 2022-09-17 LAB — PATHOLOGIST SMEAR REVIEW

## 2022-09-17 LAB — GLUCOSE, PLEURAL OR PERITONEAL FLUID: Glucose, Fluid: <20 mg/dL

## 2022-09-17 LAB — MAGNESIUM: Magnesium: 1.8 mg/dL (ref 1.7–2.4)

## 2022-09-17 LAB — TSH: TSH: 0.708 u[IU]/mL (ref 0.350–4.500)

## 2022-09-17 MED ORDER — ONDANSETRON HCL 4 MG/2ML IJ SOLN: 4.0000 mg | Freq: Four times a day (QID) | INTRAMUSCULAR | Status: DC | PRN

## 2022-09-17 MED ORDER — MELATONIN 3 MG PO TABS: 3.0000 mg | ORAL_TABLET | Freq: Every evening | ORAL | Status: DC | PRN

## 2022-09-17 MED ORDER — DOCUSATE SODIUM 100 MG PO CAPS: 100.0000 mg | ORAL_CAPSULE | Freq: Two times a day (BID) | ORAL | Status: DC | PRN

## 2022-09-17 MED ORDER — FENTANYL CITRATE PF 50 MCG/ML IJ SOSY
25.0000 ug | PREFILLED_SYRINGE | INTRAMUSCULAR | Status: DC | PRN
  Administered 2022-09-17: 25 ug via INTRAVENOUS
  Filled 2022-09-17: qty 1

## 2022-09-17 MED ORDER — ORAL CARE MOUTH RINSE: 15.0000 mL | OROMUCOSAL | Status: DC | PRN

## 2022-09-17 MED ORDER — NALOXONE HCL 0.4 MG/ML IJ SOLN: 0.4000 mg | INTRAMUSCULAR | Status: DC | PRN

## 2022-09-17 MED ORDER — ACETAMINOPHEN 325 MG PO TABS: 650.0000 mg | ORAL_TABLET | Freq: Four times a day (QID) | ORAL | Status: DC | PRN

## 2022-09-17 MED ORDER — PREDNISONE 5 MG PO TABS
50.0000 mg | ORAL_TABLET | Freq: Every day | ORAL | Status: DC
  Administered 2022-09-17 – 2022-09-18 (×2): 50 mg via ORAL
  Filled 2022-09-17 (×2): qty 2

## 2022-09-17 MED ORDER — LACTATED RINGERS IV SOLN: INTRAVENOUS | Status: AC

## 2022-09-17 MED ORDER — ROSUVASTATIN CALCIUM 5 MG PO TABS
5.0000 mg | ORAL_TABLET | Freq: Every day | ORAL | Status: DC
  Administered 2022-09-17 – 2022-09-18 (×2): 5 mg via ORAL
  Filled 2022-09-17 (×2): qty 1

## 2022-09-17 MED ORDER — POLYVINYL ALCOHOL 1.4 % OP SOLN: 1.0000 [drp] | OPHTHALMIC | Status: DC | PRN

## 2022-09-17 MED ORDER — ACETAMINOPHEN 650 MG RE SUPP: 650.0000 mg | Freq: Four times a day (QID) | RECTAL | Status: DC | PRN

## 2022-09-17 MED ORDER — TAMSULOSIN HCL 0.4 MG PO CAPS
0.4000 mg | ORAL_CAPSULE | Freq: Every day | ORAL | Status: DC
  Administered 2022-09-17 – 2022-09-18 (×2): 0.4 mg via ORAL
  Filled 2022-09-17 (×2): qty 1

## 2022-09-17 NOTE — Consult Note (Signed)
Reason for Consult:Left knee pain Referring Physician: Hughie Closs Time called: [id number] Time at bedside: 0904   Charles Norton is an 82 y.o. male.  HPI: Kaden presented to the ED yesterday with knee pain and N/V over the past 24h. Aspiration showed bacteria as well as crystals and orthopedic surgery was consulted. He's been doing well since his TKA and does not use any assistive devices to ambulate.  Past Medical History:  Diagnosis Date   Adenomatous colon polyp    Anemia    Arthritis    Basal cell carcinoma    BPH (benign prostatic hyperplasia)    Cataract of left eye    due to trauma in left eye   Diverticulosis    in sigmoid colon and in the descending colon   Hemorrhoids    Hypercholesterolemia    Hypertension    Iron deficiency    anemia   Lichen planus    of tongue   Osteoarthritis    PONV (postoperative nausea and vomiting)    Rectal prolapse    Shingles    right ophthalmic involvement   Tinnitus     Past Surgical History:  Procedure Laterality Date   COLONOSCOPY     2010 and 2020   INGUINAL HERNIA REPAIR Bilateral    KNEE SURGERY     arthrosocopy    removal of cyst     sphenoid cyst    ROTATOR CUFF REPAIR Bilateral    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 He has never used smokeless tobacco. He reports current alcohol use of about 12.0 standard drinks of alcohol per week. He reports that he does not use drugs.  Allergies:  Allergies   Allergen Reactions   Other Nausea And Vomiting    All pain medications.  Can tolerate if anti-nausea medication first     Medications: I have reviewed the patient's current medications.  Results for orders placed or performed during the hospital encounter of 09/16/22 (from the past 48 hour(s))  Urinalysis, Routine w reflex microscopic -Urine, Clean Catch     Status: None   Collection Time: 09/16/22  5:03 PM  Result Value Ref Range   Color, Urine YELLOW YELLOW   APPearance CLEAR CLEAR   Specific Gravity, Urine 1.018 1.005 - 1.030   pH 6.0 5.0 - 8.0   Glucose, UA NEGATIVE NEGATIVE mg/dL   Hgb urine dipstick NEGATIVE NEGATIVE   Bilirubin Urine NEGATIVE NEGATIVE   Ketones, ur NEGATIVE NEGATIVE mg/dL   Protein, ur NEGATIVE NEGATIVE mg/dL   Nitrite NEGATIVE NEGATIVE   Leukocytes,Ua NEGATIVE NEGATIVE    Comment: Performed at [facility], [address] [place], [place] [address]  CBG monitoring, ED     Status: None   Collection Time: 09/16/22  5:09 PM  Result Value Ref Range   Glucose-Capillary 99 70 -  99 mg/dL    Comment: Glucose reference range applies only to samples taken after fasting for at least 8 hours.  CBC WITH DIFFERENTIAL     Status: Abnormal   Collection Time: 09/16/22  5:12 PM  Result Value Ref Range   WBC 14.9 (H) 4.0 - 10.5 K/uL   RBC 3.73 (L) 4.22 - 5.81 MIL/uL   Hemoglobin 12.2 (L) 13.0 - 17.0 g/dL   HCT 16.1 (L) 09.6 - 04.5 %   MCV 96.8 80.0 - 100.0 fL   MCH 32.7 26.0 - 34.0 pg   MCHC 33.8 30.0 - 36.0 g/dL   RDW 40.9 81.1 - 91.4 %   Platelets 197 150 - 400 K/uL   nRBC 0.0 0.0 - 0.2 %   Neutrophils Relative % 95 %   Neutro Abs 14.1 (H) 1.7 - 7.7 K/uL   Lymphocytes Relative 1 %   Lymphs Abs 0.2 (L) 0.7 - 4.0 K/uL   Monocytes Relative 3 %   Monocytes Absolute 0.4 0.1 - 1.0 K/uL   Eosinophils Relative 0 %   Eosinophils Absolute 0.0 0.0 - 0.5 K/uL   Basophils Relative 0 %   Basophils Absolute 0.0 0.0 - 0.1 K/uL   Immature Granulocytes 1 %   Abs  Immature Granulocytes 0.12 (H) 0.00 - 0.07 K/uL    Comment: Performed at [facility], [address] [place], [place] [address]  Troponin I (High Sensitivity)     Status: None   Collection Time: 09/16/22  5:12 PM  Result Value Ref Range   Troponin I (High Sensitivity) 5 <18 ng/L    Comment: (NOTE) Elevated high sensitivity troponin I (hsTnI) values and significant  changes across serial measurements may suggest ACS but many other  chronic and acute conditions are known to elevate hsTnI results.  Refer to the "Links" section for chest pain algorithms and additional  guidance. Performed at [facility], [address] [place], [place] [address]   Comprehensive metabolic panel     Status: Abnormal   Collection Time: 09/16/22  5:12 PM  Result Value Ref Range   Sodium 138 135 - 145 mmol/L   Potassium 4.2 3.5 - 5.1 mmol/L   Chloride 108 98 - 111 mmol/L   CO2 21 (L) 22 - 32 mmol/L   Glucose, Bld 99 70 - 99 mg/dL    Comment: Glucose reference range applies only to samples taken after fasting for at least 8 hours.   BUN 17 8 - 23 mg/dL   Creatinine, Ser 8.65 (H) 0.61 - 1.24 mg/dL   Calcium 9.0 8.9 - 78.4 mg/dL   Total Protein 5.6 (L) 6.5 - 8.1 g/dL   Albumin 3.5 3.5 - 5.0 g/dL   AST 26 15 - 41 U/L   ALT 17 0 - 44 U/L   Alkaline Phosphatase 42 38 - 126 U/L   Total Bilirubin 1.1 0.3 - 1.2 mg/dL   GFR, Estimated 42 (L) >60 mL/min    Comment: (NOTE) Calculated using the CKD-EPI Creatinine Equation (2021)    Anion gap 9 5 - 15    Comment: Performed at [facility], [address] [place], [place] [address]  Lipase, blood     Status: None   Collection Time: 09/16/22  5:12 PM  Result Value Ref Range   Lipase 25 11 - 51 U/L    Comment: Performed at [facility], [address], [place], [place] [address]  Resp panel by RT-PCR (RSV, Flu  A&B, Covid) Anterior Nasal Swab     Status: None   Collection Time: 09/16/22  6:15 PM   Specimen: Anterior Nasal Swab  Result  Value Ref Range   SARS Coronavirus 2 by RT PCR NEGATIVE NEGATIVE   Influenza A by PCR NEGATIVE NEGATIVE   Influenza B by PCR NEGATIVE NEGATIVE    Comment: (NOTE) The Xpert Xpress SARS-CoV-2/FLU/RSV plus assay is intended as an aid in the diagnosis of influenza from Nasopharyngeal swab specimens and should not be used as a sole basis for treatment. Nasal washings and aspirates are unacceptable for Xpert Xpress SARS-CoV-2/FLU/RSV testing.  Fact Sheet for Patients: BloggerCourse.com  Fact Sheet for Healthcare Providers: SeriousBroker.it  This test is not yet approved or cleared by the [place] FDA and has been authorized for detection and/or diagnosis of SARS-CoV-2 by FDA under an Emergency Use Authorization (EUA). This EUA will remain in effect (meaning this test can be used) for the duration of the COVID-19 declaration under Section 564(b)(1) of the Act, 21 U.S.C. section 360bbb-3(b)(1), unless the authorization is terminated or revoked.     Resp Syncytial Virus by PCR NEGATIVE NEGATIVE    Comment: (NOTE) Fact Sheet for Patients: BloggerCourse.com  Fact Sheet for Healthcare Providers: SeriousBroker.it  This test is not yet approved or cleared by the [place] FDA and has been authorized for detection and/or diagnosis of SARS-CoV-2 by FDA under an Emergency Use Authorization (EUA). This EUA will remain in effect (meaning this test can be used) for the duration of the COVID-19 declaration under Section 564(b)(1) of the Act, 21 U.S.C. section 360bbb-3(b)(1), unless the authorization is terminated or revoked.  Performed at [facility], [address] [place], [place] [address]   Troponin I (High Sensitivity)     Status: None   Collection Time: 09/16/22  7:02 PM  Result Value Ref Range   Troponin I (High Sensitivity) 5 <18 ng/L    Comment: (NOTE) Elevated high  sensitivity troponin I (hsTnI) values and significant  changes across serial measurements may suggest ACS but many other  chronic and acute conditions are known to elevate hsTnI results.  Refer to the "Links" section for chest pain algorithms and additional  guidance. Performed at [facility], [address] [place], [place] [address]   Synovial cell count + diff, w/ crystals     Status: Abnormal   Collection Time: 09/16/22 10:55 PM  Result Value Ref Range   Color, Synovial YELLOW YELLOW   Appearance-Synovial TURBID (A) CLEAR   Crystals, Fluid INTRACELLULAR CALCIUM PYROPHOSPHATE CRYSTALS     Comment: EXTRACELLULAR CALCIUM PYROPHOSPHATE CRYSTALS   WBC, Synovial  0 - 200 /cu mm    UNABLE TO PERFORM THE COUNT DUE TO PRESENCE OF THE FIBRIN CLOTS IN SPECIMEN   Neutrophil, Synovial 91 (H) 0 - 25 %   Lymphocytes-Synovial Fld 1 0 - 20 %   Monocyte-Macrophage-Synovial Fluid 8 (L) 50 - 90 %   Eosinophils-Synovial 0 0 - 1 %   Other Cells-SYN      PRESENCE OF THE INTRA AND EXTRACELLULAR BACTERIA NOTED ON THE SLIDE.    Comment: RESULTS CALLED AND READ BACK TO Mliss Fritz RN 2841 09/17/22 AMIREHSANIF Performed at [facility], [address], [place], [place] [address]   Glucose, pleural or peritoneal fluid     Status: None   Collection Time: 09/16/22 10:55 PM  Result Value Ref Range   Glucose, Fluid <20 mg/dL    Comment: CRITICAL RESULT CALLED  TO, READ BACK BY AND VERIFIED WITH R. TERRY,RN. 2359 09/16/22. LPAIT (NOTE) No normal range established for this test Results should be evaluated in conjunction with serum values    Fluid Type-FGLU SYNOVIAL FLUID JOINT KNEE     Comment: Performed at [facility], [address], [place], [place] [address]  Protein, pleural or peritoneal fluid     Status: None   Collection Time: 09/16/22 10:55 PM  Result Value Ref Range   Total protein, fluid 3.6 g/dL    Comment: (NOTE) No normal range established for this test Results should be  evaluated in conjunction with serum values    Fluid Type-FTP SYNOVIAL FLUID JOINT KNEE     Comment: Performed at [facility], [address], [place], [place] [address]  Body fluid culture w Gram Stain     Status: None (Preliminary result)   Collection Time: 09/16/22 11:15 PM   Specimen: Body Fluid  Result Value Ref Range   Specimen Description FLUID    Special Requests JOINT KNEE    Gram Stain      FEW WBC PRESENT, PREDOMINANTLY PMN FEW GRAM POSITIVE COCCI Performed at [facility], [address], [place], [place] [address]    Culture PENDING    Report Status PENDING   CBC with Differential/Platelet     Status: Abnormal   Collection Time: 09/17/22 12:35 AM  Result Value Ref Range   WBC 12.4 (H) 4.0 - 10.5 K/uL   RBC 3.24 (L) 4.22 - 5.81 MIL/uL   Hemoglobin 10.5 (L) 13.0 - 17.0 g/dL   HCT 91.4 (L) 78.2 - 95.6 %   MCV 96.9 80.0 - 100.0 fL   MCH 32.4 26.0 - 34.0 pg   MCHC 33.4 30.0 - 36.0 g/dL   RDW 21.3 08.6 - 57.8 %   Platelets 143 (L) 150 - 400 K/uL   nRBC 0.0 0.0 - 0.2 %   Neutrophils Relative % 97 %   Neutro Abs 12.0 (H) 1.7 - 7.7 K/uL   Lymphocytes Relative 3 %   Lymphs Abs 0.4 (L) 0.7 - 4.0 K/uL   Monocytes Relative 0 %   Monocytes Absolute 0.0 (L) 0.1 - 1.0 K/uL   Eosinophils Relative 0 %   Eosinophils Absolute 0.0 0.0 - 0.5 K/uL   Basophils Relative 0 %   Basophils Absolute 0.0 0.0 - 0.1 K/uL   nRBC 0 0 /100 WBC   Abs Immature Granulocytes 0.00 0.00 - 0.07 K/uL    Comment: Performed at [facility], [address], [place], [place] [address]  Comprehensive metabolic panel     Status: Abnormal   Collection Time: 09/17/22 12:35 AM  Result Value Ref Range   Sodium 135 135 - 145 mmol/L   Potassium 4.3 3.5 - 5.1 mmol/L   Chloride 106 98 - 111 mmol/L   CO2 19 (L) 22 - 32 mmol/L   Glucose, Bld 107 (H) 70 - 99 mg/dL    Comment: Glucose reference range applies only to samples taken after fasting for at least 8 hours.   BUN 19 8 - 23 mg/dL    Creatinine, Ser 9.52 (H) 0.61 - 1.24 mg/dL   Calcium 8.3 (L) 8.9 - 10.3 mg/dL   Total Protein 4.6 (L) 6.5 - 8.1 g/dL   Albumin 2.9 (L) 3.5 - 5.0 g/dL   AST 34 15 - 41 U/L   ALT 21 0 - 44 U/L   Alkaline Phosphatase 35 (L) 38 - 126  U/L   Total Bilirubin 0.9 0.3 - 1.2 mg/dL   GFR, Estimated 47 (L) >60 mL/min    Comment: (NOTE) Calculated using the CKD-EPI Creatinine Equation (2021)    Anion gap 10 5 - 15    Comment: Performed at [facility], [address] [place], [place] [address]  Magnesium     Status: None   Collection Time: 09/17/22 12:35 AM  Result Value Ref Range   Magnesium 1.8 1.7 - 2.4 mg/dL    Comment: Performed at [facility], [address] [place], [place] [address]  Sedimentation rate     Status: None   Collection Time: 09/17/22 12:35 AM  Result Value Ref Range   Sed Rate 5 0 - 16 mm/hr    Comment: Performed at [facility], [address], [place], [place] [address]  Protime-INR     Status: Abnormal   Collection Time: 09/17/22  2:06 AM  Result Value Ref Range   Prothrombin Time 15.3 (H) 11.4 - 15.2 seconds   INR 1.2 0.8 - 1.2    Comment: (NOTE) INR goal varies based on device and disease states. Performed at [facility], [address], [place], [place] [address]     CT Knee Left Wo Contrast  Result Date: 09/17/2022 CLINICAL DATA:  Knee replacement, periprosthetic fracture suspected EXAM: CT OF THE LEFT KNEE WITHOUT CONTRAST TECHNIQUE: Multidetector CT imaging of the left knee was performed according to the standard protocol. Multiplanar CT image reconstructions were also generated. RADIATION DOSE REDUCTION: This exam was performed according to the departmental dose-optimization program which includes automated exposure control, adjustment of the mA and/or kV according to patient size and/or use of iterative reconstruction technique. COMPARISON:  None Available. FINDINGS: Bones/Joint/Cartilage Surgical changes of left total knee  arthroplasty are identified. Normal alignment. No acute fracture or dislocation. No periprosthetic lucency or hardware fracture identified. No osseous erosions. 16 x 12 mm intra-articular loose body noted within the suprapatellar recess. Ligaments Suboptimally assessed by CT. Muscles and Tendons Normal muscle bulk. Quadriceps tendon is intact. Patellar tendon is obscured partially by streak artifact. The visualized portion is unremarkable. Soft tissues Capsular calcifications are seen within the suprapatellar recess. Moderate left knee effusion. IMPRESSION: 1. Surgical changes of left total knee arthroplasty without evidence of fracture or hardware complication. 2. 16 x 12 mm intra-articular loose body within the suprapatellar recess. 3. Moderate left knee effusion. Electronically Signed   By: Helyn Numbers M.D.   On: 09/17/2022 00:41   DG Chest Portable 1 View  Result Date: 09/16/2022 CLINICAL DATA:  Syncope EXAM: PORTABLE CHEST 1 VIEW COMPARISON:  None Available. FINDINGS: Mild elevation of the left hemidiaphragm with left basilar linear atelectasis. Lungs are otherwise clear. No pneumothorax. Tiny left pleural effusion. Cardiac size within normal limits. Pulmonary vascularity is normal. No acute bone abnormality. IMPRESSION: 1. Mild elevation of the left hemidiaphragm with left basilar atelectasis. Tiny left pleural effusion. Electronically Signed   By: Helyn Numbers M.D.   On: 09/16/2022 19:32   DG Knee Left Port  Result Date: 09/16/2022 CLINICAL DATA:  Pain EXAM: PORTABLE LEFT KNEE - 2 VIEW COMPARISON:  None Available. FINDINGS: Portable x-rays demonstrates total knee arthroplasty with cemented femoral and tibial component. Patellar button. Expected alignment. No hardware failure. Osteopenia. Soft tissue calcifications are seen surrounding the knee. No joint effusion. IMPRESSION: Surgical changes of total knee arthroplasty. Electronically Signed   By: Karen Kays M.D.   On: 09/16/2022 17:58  Review of Systems  Constitutional:  Negative for chills, diaphoresis and fever.  HENT:  Negative for ear discharge, ear pain, hearing loss and tinnitus.   Eyes:  Negative for photophobia and pain.  Respiratory:  Negative for cough and shortness of breath.   Cardiovascular:  Negative for chest pain.  Gastrointestinal:  Negative for abdominal pain, nausea and vomiting.  Genitourinary:  Negative for dysuria, flank pain, frequency and urgency.  Musculoskeletal:  Positive for arthralgias (Left knee). Negative for back pain, myalgias and neck pain.  Neurological:  Negative for dizziness and headaches.  Hematological:  Does not bruise/bleed easily.  Psychiatric/Behavioral:  The patient is not nervous/anxious.    Blood pressure 105/64, pulse 90, temperature 98.2 F (36.8 C), resp. rate 10, height 5' 8.5" (1.74 m), weight 70.3 kg, SpO2 96 %. Physical Exam Constitutional:      General: He is not in acute distress.    Appearance: He is well-developed. He is not diaphoretic.  HENT:     Head: Normocephalic and atraumatic.  Eyes:     General: No scleral icterus.       Right eye: No discharge.        Left eye: No discharge.     Conjunctiva/sclera: Conjunctivae normal.  Cardiovascular:     Rate and Rhythm: Normal rate and regular rhythm.  Pulmonary:     Effort: Pulmonary effort is normal. No respiratory distress.  Musculoskeletal:     Cervical back: Normal range of motion.     Comments: LLE No traumatic wounds, ecchymosis, or rash  Mod knee TTP  Mod knee effusion  Sens DPN, SPN, TN intact  Motor EHL, ext, flex, evers 5/5  DP 0, PT 0, No significant edema  Skin:    General: Skin is warm and dry.  Neurological:     Mental Status: He is alert.  Psychiatric:        Mood and Affect: Mood normal.        Behavior: Behavior normal.     Assessment/Plan: Left knee septic joint -- Case to be reviewed by Dr. [name] who will formulate a plan. Continue IV abx.    Freeman Caldron,  PA-C Orthopedic Surgery [phone] 09/17/2022, 9:10 AM

## 2022-09-17 NOTE — ED Notes (Signed)
Patient transported to CT 

## 2022-09-17 NOTE — Progress Notes (Signed)
This is 82 year old pleasant gentleman with medical history of hypertension and hyperlipidemia and BPH presented to ED with generalized weakness as well as left knee pain.  Admitted to hospitalist service early morning.  Underwent arthrocentesis of the left knee by ED physician.  Orthopedics consulted.  Dr. Suzette Battiest to see this patient.  Patient seen and examined in the ED.  He states that he is feeling better than yesterday.  Left knee pain is improving as well however he still has significant restricted range of motion in the left knee.  Fluid analysis shows evidence of pseudogout.  Will defer to orthopedic for management.  Creatinine and dehydration improving.  PT OT consulted.  Blood pressure improving but is still low.  Continue to hold antihypertensives.

## 2022-09-17 NOTE — ED Notes (Signed)
ED TO INPATIENT HANDOFF REPORT  ED Nurse Name and Phone #: [phone]  S Name/Age/Gender Charles Norton 82 y.o. male Room/Bed: 046C/046C  Code Status   Code Status: Full Code  Home/SNF/Other Home Patient oriented to: self, place, time, and situation Is this baseline? Yes   Triage Complete: Triage complete  Chief Complaint AKI (acute kidney injury) [N17.9]  Triage Note Patient was bib [facility] from home with complaints of knee pain and also a near syncopal episode. EMS reports patient was sitting on the couch with his with and he got very pale and diaphoretic and stopped talking. Patient returned to baseline quickly and states his only  complaint is knee pain. EMS states that he had some orthostatic hypotension with them and he also had a vomiting episode with them also. Patient reports he was playing tennis today and did some extra yard work yesterday but has been drinking and eating as he normally does.     Allergies Allergies  Allergen Reactions   Other Nausea And Vomiting    All pain medications.  Can tolerate if anti-nausea medication first     Level of Care/Admitting Diagnosis ED Disposition     ED Disposition  Admit   Condition  --   Comment  Hospital Area: [facility] [[id number]]  Level of Care: Progressive [102]  Admit to Progressive based on following criteria: MULTISYSTEM THREATS such as stable sepsis, metabolic/electrolyte imbalance with or without encephalopathy that is responding to early treatment.  May admit patient to [facility] or [facility] if equivalent level of care is available:: No  Covid Evaluation: Asymptomatic - no recent exposure (last 10 days) testing not required  Diagnosis: AKI (acute kidney injury) [147829]  Admitting Physician: Angie Fava [[id number]]  Attending Physician: Angie Fava [[id number]]  Certification:: I certify this patient will need inpatient services for at least 2 midnights  Estimated Length of Stay:  2          B Medical/Surgery History Past Medical History:  Diagnosis Date   Adenomatous colon polyp    Anemia    Arthritis    Basal cell carcinoma    BPH (benign prostatic hyperplasia)    Cataract of left eye    due to trauma in left eye   Diverticulosis    in sigmoid colon and in the descending colon   Hemorrhoids    Hypercholesterolemia    Hypertension    Iron deficiency    anemia   Lichen planus    of tongue   Osteoarthritis    PONV (postoperative nausea and vomiting)    Rectal prolapse    Shingles    right ophthalmic involvement   Tinnitus    Past Surgical History:  Procedure Laterality Date   COLONOSCOPY     2010 and 2020   INGUINAL HERNIA REPAIR Bilateral    KNEE SURGERY     arthrosocopy    removal of cyst     sphenoid cyst    ROTATOR CUFF REPAIR Bilateral    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   Procedure: LEFT TOTAL KNEE ARTHROPLASTY;  Surgeon: Durene Romans, MD;  Location: [facility];  Service: Orthopedics;  Laterality: Left;  70 mins with block     A IV Location/Drains/Wounds Patient Lines/Drains/Airways Status     Active Line/Drains/Airways     Name Placement date Placement time Site Days   Peripheral IV 09/16/22 20 G Anterior;Distal;Left;Upper Arm 09/16/22  1619  Arm  1   Incision (Closed) 03/03/17  Knee Left 03/03/17  1125  -- 2024            Intake/Output Last 24 hours  Intake/Output Summary (Last 24 hours) at 09/17/2022 1506 Last data filed at 09/17/2022 0813 Gross per 24 hour  Intake 2270.06 ml  Output --  Net 2270.06 ml    Labs/Imaging Results for orders placed or performed during the hospital encounter of 09/16/22 (from the past 48 hour(s))  Urinalysis, Routine w reflex microscopic -Urine, Clean Catch     Status: None   Collection Time: 09/16/22  5:03 PM  Result Value Ref Range   Color, Urine YELLOW YELLOW   APPearance CLEAR CLEAR   Specific Gravity, Urine 1.018 1.005 - 1.030   pH 6.0 5.0 - 8.0   Glucose, UA NEGATIVE  NEGATIVE mg/dL   Hgb urine dipstick NEGATIVE NEGATIVE   Bilirubin Urine NEGATIVE NEGATIVE   Ketones, ur NEGATIVE NEGATIVE mg/dL   Protein, ur NEGATIVE NEGATIVE mg/dL   Nitrite NEGATIVE NEGATIVE   Leukocytes,Ua NEGATIVE NEGATIVE    Comment: Performed at [facility], [address], [place], [place] [address]  CBG monitoring, ED     Status: None   Collection Time: 09/16/22  5:09 PM  Result Value Ref Range   Glucose-Capillary 99 70 - 99 mg/dL    Comment: Glucose reference range applies only to samples taken after fasting for at least 8 hours.  CBC WITH DIFFERENTIAL     Status: Abnormal   Collection Time: 09/16/22  5:12 PM  Result Value Ref Range   WBC 14.9 (H) 4.0 - 10.5 K/uL   RBC 3.73 (L) 4.22 - 5.81 MIL/uL   Hemoglobin 12.2 (L) 13.0 - 17.0 g/dL   HCT 60.4 (L) 54.0 - 98.1 %   MCV 96.8 80.0 - 100.0 fL   MCH 32.7 26.0 - 34.0 pg   MCHC 33.8 30.0 - 36.0 g/dL   RDW 19.1 47.8 - 29.5 %   Platelets 197 150 - 400 K/uL   nRBC 0.0 0.0 - 0.2 %   Neutrophils Relative % 95 %   Neutro Abs 14.1 (H) 1.7 - 7.7 K/uL   Lymphocytes Relative 1 %   Lymphs Abs 0.2 (L) 0.7 - 4.0 K/uL   Monocytes Relative 3 %   Monocytes Absolute 0.4 0.1 - 1.0 K/uL   Eosinophils Relative 0 %   Eosinophils Absolute 0.0 0.0 - 0.5 K/uL   Basophils Relative 0 %   Basophils Absolute 0.0 0.0 - 0.1 K/uL   Immature Granulocytes 1 %   Abs Immature Granulocytes 0.12 (H) 0.00 - 0.07 K/uL    Comment: Performed at [facility], [address] [place], [place] [address]  Troponin I (High Sensitivity)     Status: None   Collection Time: 09/16/22  5:12 PM  Result Value Ref Range   Troponin I (High Sensitivity) 5 <18 ng/L    Comment: (NOTE) Elevated high sensitivity troponin I (hsTnI) values and significant  changes across serial measurements may suggest ACS but many other  chronic and acute conditions are known to elevate hsTnI results.  Refer to the "Links" section for chest pain algorithms and additional   guidance. Performed at [facility], [address] [place], [place] [address]   Comprehensive metabolic panel     Status: Abnormal   Collection Time: 09/16/22  5:12 PM  Result Value Ref Range   Sodium 138 135 - 145 mmol/L   Potassium 4.2 3.5 - 5.1 mmol/L   Chloride  108 98 - 111 mmol/L   CO2 21 (L) 22 - 32 mmol/L   Glucose, Bld 99 70 - 99 mg/dL    Comment: Glucose reference range applies only to samples taken after fasting for at least 8 hours.   BUN 17 8 - 23 mg/dL   Creatinine, Ser 1.61 (H) 0.61 - 1.24 mg/dL   Calcium 9.0 8.9 - 09.6 mg/dL   Total Protein 5.6 (L) 6.5 - 8.1 g/dL   Albumin 3.5 3.5 - 5.0 g/dL   AST 26 15 - 41 U/L   ALT 17 0 - 44 U/L   Alkaline Phosphatase 42 38 - 126 U/L   Total Bilirubin 1.1 0.3 - 1.2 mg/dL   GFR, Estimated 42 (L) >60 mL/min    Comment: (NOTE) Calculated using the CKD-EPI Creatinine Equation (2021)    Anion gap 9 5 - 15    Comment: Performed at [facility], [address], [place], [place] [address]  Lipase, blood     Status: None   Collection Time: 09/16/22  5:12 PM  Result Value Ref Range   Lipase 25 11 - 51 U/L    Comment: Performed at [facility], [address] [place], [place] [address]  Resp panel by RT-PCR (RSV, Flu A&B, Covid) Anterior Nasal Swab     Status: None   Collection Time: 09/16/22  6:15 PM   Specimen: Anterior Nasal Swab  Result Value Ref Range   SARS Coronavirus 2 by RT PCR NEGATIVE NEGATIVE   Influenza A by PCR NEGATIVE NEGATIVE   Influenza B by PCR NEGATIVE NEGATIVE    Comment: (NOTE) The Xpert Xpress SARS-CoV-2/FLU/RSV plus assay is intended as an aid in the diagnosis of influenza from Nasopharyngeal swab specimens and should not be used as a sole basis for treatment. Nasal washings and aspirates are unacceptable for Xpert Xpress SARS-CoV-2/FLU/RSV testing.  Fact Sheet for Patients: BloggerCourse.com  Fact Sheet for Healthcare  Providers: SeriousBroker.it  This test is not yet approved or cleared by the [place] FDA and has been authorized for detection and/or diagnosis of SARS-CoV-2 by FDA under an Emergency Use Authorization (EUA). This EUA will remain in effect (meaning this test can be used) for the duration of the COVID-19 declaration under Section 564(b)(1) of the Act, 21 U.S.C. section 360bbb-3(b)(1), unless the authorization is terminated or revoked.     Resp Syncytial Virus by PCR NEGATIVE NEGATIVE    Comment: (NOTE) Fact Sheet for Patients: BloggerCourse.com  Fact Sheet for Healthcare Providers: SeriousBroker.it  This test is not yet approved or cleared by the [place] FDA and has been authorized for detection and/or diagnosis of SARS-CoV-2 by FDA under an Emergency Use Authorization (EUA). This EUA will remain in effect (meaning this test can be used) for the duration of the COVID-19 declaration under Section 564(b)(1) of the Act, 21 U.S.C. section 360bbb-3(b)(1), unless the authorization is terminated or revoked.  Performed at [facility], [address] [place], [place] [address]   Troponin I (High Sensitivity)     Status: None   Collection Time: 09/16/22  7:02 PM  Result Value Ref Range   Troponin I (High Sensitivity) 5 <18 ng/L    Comment: (NOTE) Elevated high sensitivity troponin I (hsTnI) values and significant  changes across serial measurements may suggest ACS but many other  chronic and acute conditions are known to elevate hsTnI results.  Refer to the "Links" section for chest pain algorithms and additional  guidance. Performed  at [facility], [address] [place], [place] [address]   Synovial cell count + diff, w/ crystals     Status: Abnormal   Collection Time: 09/16/22 10:55 PM  Result Value Ref Range   Color, Synovial YELLOW YELLOW   Appearance-Synovial TURBID (A)  CLEAR   Crystals, Fluid INTRACELLULAR CALCIUM PYROPHOSPHATE CRYSTALS     Comment: EXTRACELLULAR CALCIUM PYROPHOSPHATE CRYSTALS   WBC, Synovial  0 - 200 /cu mm    UNABLE TO PERFORM THE COUNT DUE TO PRESENCE OF THE FIBRIN CLOTS IN SPECIMEN   Neutrophil, Synovial 91 (H) 0 - 25 %   Lymphocytes-Synovial Fld 1 0 - 20 %   Monocyte-Macrophage-Synovial Fluid 8 (L) 50 - 90 %   Eosinophils-Synovial 0 0 - 1 %   Other Cells-SYN      PRESENCE OF THE INTRA AND EXTRACELLULAR BACTERIA NOTED ON THE SLIDE.    Comment: RESULTS CALLED AND READ BACK TO Mliss Fritz RN [id number] 09/17/22 AMIREHSANIF Performed at [facility], [address], [place], [place] [address]   Glucose, pleural or peritoneal fluid     Status: None   Collection Time: 09/16/22 10:55 PM  Result Value Ref Range   Glucose, Fluid <20 mg/dL    Comment: CRITICAL RESULT CALLED TO, READ BACK BY AND VERIFIED WITH R. TERRY,RN. 2359 09/16/22. LPAIT (NOTE) No normal range established for this test Results should be evaluated in conjunction with serum values    Fluid Type-FGLU SYNOVIAL FLUID JOINT KNEE     Comment: Performed at [facility], [address] [place], [place] [address]  Protein, pleural or peritoneal fluid     Status: None   Collection Time: 09/16/22 10:55 PM  Result Value Ref Range   Total protein, fluid 3.6 g/dL    Comment: (NOTE) No normal range established for this test Results should be evaluated in conjunction with serum values    Fluid Type-FTP SYNOVIAL FLUID JOINT KNEE     Comment: Performed at [facility], [address], [place], [place] [address]  Pathologist smear review     Status: None   Collection Time: 09/16/22 10:55 PM  Result Value Ref Range   Path Review Acute inflammation     Comment: Numerous bacteria, likely contaminated transport media. Reviewed by Beulah Gandy. Luisa Hart, M.D. 09/17/2022 Performed at [facility], [address] [place], [place] [address]   Body fluid culture w Gram  Stain     Status: None (Preliminary result)   Collection Time: 09/16/22 11:15 PM   Specimen: Body Fluid  Result Value Ref Range   Specimen Description FLUID    Special Requests JOINT KNEE    Gram Stain      FEW WBC PRESENT, PREDOMINANTLY PMN FEW GRAM POSITIVE COCCI Performed at [facility], [address] [place], [place] [address]    Culture PENDING    Report Status PENDING   CBC with Differential/Platelet     Status: Abnormal   Collection Time: 09/17/22 12:35 AM  Result Value Ref Range   WBC 12.4 (H) 4.0 - 10.5 K/uL   RBC 3.24 (L) 4.22 - 5.81 MIL/uL   Hemoglobin 10.5 (L) 13.0 - 17.0 g/dL   HCT 69.6 (L) 29.5 - 28.4 %   MCV 96.9 80.0 - 100.0 fL   MCH 32.4 26.0 - 34.0 pg   MCHC 33.4 30.0 - 36.0 g/dL   RDW 13.2 44.0 - 10.2 %   Platelets 143 (L) 150 -  400 K/uL   nRBC 0.0 0.0 - 0.2 %   Neutrophils Relative % 97 %   Neutro Abs 12.0 (H) 1.7 - 7.7 K/uL   Lymphocytes Relative 3 %   Lymphs Abs 0.4 (L) 0.7 - 4.0 K/uL   Monocytes Relative 0 %   Monocytes Absolute 0.0 (L) 0.1 - 1.0 K/uL   Eosinophils Relative 0 %   Eosinophils Absolute 0.0 0.0 - 0.5 K/uL   Basophils Relative 0 %   Basophils Absolute 0.0 0.0 - 0.1 K/uL   nRBC 0 0 /100 WBC   Abs Immature Granulocytes 0.00 0.00 - 0.07 K/uL    Comment: Performed at [facility], [address], [place], [place] [address]  Comprehensive metabolic panel     Status: Abnormal   Collection Time: 09/17/22 12:35 AM  Result Value Ref Range   Sodium 135 135 - 145 mmol/L   Potassium 4.3 3.5 - 5.1 mmol/L   Chloride 106 98 - 111 mmol/L   CO2 19 (L) 22 - 32 mmol/L   Glucose, Bld 107 (H) 70 - 99 mg/dL    Comment: Glucose reference range applies only to samples taken after fasting for at least 8 hours.   BUN 19 8 - 23 mg/dL   Creatinine, Ser 3.24 (H) 0.61 - 1.24 mg/dL   Calcium 8.3 (L) 8.9 - 10.3 mg/dL   Total Protein 4.6 (L) 6.5 - 8.1 g/dL   Albumin 2.9 (L) 3.5 - 5.0 g/dL   AST 34 15 - 41 U/L   ALT 21 0 - 44 U/L   Alkaline  Phosphatase 35 (L) 38 - 126 U/L   Total Bilirubin 0.9 0.3 - 1.2 mg/dL   GFR, Estimated 47 (L) >60 mL/min    Comment: (NOTE) Calculated using the CKD-EPI Creatinine Equation (2021)    Anion gap 10 5 - 15    Comment: Performed at [facility], [address] [place], [place] [address]  Magnesium     Status: None   Collection Time: 09/17/22 12:35 AM  Result Value Ref Range   Magnesium 1.8 1.7 - 2.4 mg/dL    Comment: Performed at [facility], [address], [place], [place] [address]  Sedimentation rate     Status: None   Collection Time: 09/17/22 12:35 AM  Result Value Ref Range   Sed Rate 5 0 - 16 mm/hr    Comment: Performed at [facility], [address] [place], [place] [address]  Protime-INR     Status: Abnormal   Collection Time: 09/17/22  2:06 AM  Result Value Ref Range   Prothrombin Time 15.3 (H) 11.4 - 15.2 seconds   INR 1.2 0.8 - 1.2    Comment: (NOTE) INR goal varies based on device and disease states. Performed at [facility], [address], [place], [place] [address]    CT Knee Left Wo Contrast  Result Date: 09/17/2022 CLINICAL DATA:  Knee replacement, periprosthetic fracture suspected EXAM: CT OF THE LEFT KNEE WITHOUT CONTRAST TECHNIQUE: Multidetector CT imaging of the left knee was performed according to the standard protocol. Multiplanar CT image reconstructions were also generated. RADIATION DOSE REDUCTION: This exam was performed according to the departmental dose-optimization program which includes automated exposure control, adjustment of the mA and/or kV according to patient size and/or use of iterative reconstruction technique. COMPARISON:  None Available. FINDINGS: Bones/Joint/Cartilage Surgical changes of left total knee arthroplasty are identified. Normal alignment. No acute fracture or dislocation. No periprosthetic  lucency or hardware fracture identified. No osseous erosions. 16 x 12 mm intra-articular loose body noted within the  suprapatellar recess. Ligaments Suboptimally assessed by CT. Muscles and Tendons Normal muscle bulk. Quadriceps tendon is intact. Patellar tendon is obscured partially by streak artifact. The visualized portion is unremarkable. Soft tissues Capsular calcifications are seen within the suprapatellar recess. Moderate left knee effusion. IMPRESSION: 1. Surgical changes of left total knee arthroplasty without evidence of fracture or hardware complication. 2. 16 x 12 mm intra-articular loose body within the suprapatellar recess. 3. Moderate left knee effusion. Electronically Signed   By: Helyn Numbers M.D.   On: 09/17/2022 00:41   DG Chest Portable 1 View  Result Date: 09/16/2022 CLINICAL DATA:  Syncope EXAM: PORTABLE CHEST 1 VIEW COMPARISON:  None Available. FINDINGS: Mild elevation of the left hemidiaphragm with left basilar linear atelectasis. Lungs are otherwise clear. No pneumothorax. Tiny left pleural effusion. Cardiac size within normal limits. Pulmonary vascularity is normal. No acute bone abnormality. IMPRESSION: 1. Mild elevation of the left hemidiaphragm with left basilar atelectasis. Tiny left pleural effusion. Electronically Signed   By: Helyn Numbers M.D.   On: 09/16/2022 19:32   DG Knee Left Port  Result Date: 09/16/2022 CLINICAL DATA:  Pain EXAM: PORTABLE LEFT KNEE - 2 VIEW COMPARISON:  None Available. FINDINGS: Portable x-rays demonstrates total knee arthroplasty with cemented femoral and tibial component. Patellar button. Expected alignment. No hardware failure. Osteopenia. Soft tissue calcifications are seen surrounding the knee. No joint effusion. IMPRESSION: Surgical changes of total knee arthroplasty. Electronically Signed   By: Karen Kays M.D.   On: 09/16/2022 17:58    Pending Labs Unresulted Labs (From admission, onward)     Start     Ordered   09/18/22 0500  CBC with Differential/Platelet  Tomorrow morning,   R        09/17/22 0820   09/18/22 0500  Basic metabolic panel   Tomorrow morning,   R        09/17/22 0820   09/17/22 0230  Procalcitonin  Once,   AD       References:    Procalcitonin Lower Respiratory Tract Infection AND Sepsis Procalcitonin Algorithm   09/17/22 0230   09/17/22 0100  Sodium, urine, random  Add-on,   AD        09/17/22 0100   09/17/22 0100  Creatinine, urine, random  Add-on,   AD        09/17/22 0100   09/17/22 0100  CK  Add-on,   AD        09/17/22 0100   09/17/22 0100  TSH  Add-on,   AD        09/17/22 0100   09/17/22 0007  Magnesium  Add-on,   AD        09/17/22 0006   09/16/22 2318  C-reactive protein  Once,   URGENT        09/16/22 2318            Vitals/Pain Today's Vitals   09/17/22 1200 09/17/22 1213 09/17/22 1230 09/17/22 1245  BP: (!) 98/57 (!) 97/58 (!) 99/55 (!) 104/59  Pulse: 92 88 88 89  Resp:  18  20  Temp:  (!) 100.7 F (38.2 C)    TempSrc:      SpO2: 96% 97% 99% 96%  Weight:      Height:      PainSc:        Isolation Precautions No active isolations  Medications Medications  acetaminophen (TYLENOL) tablet 650 mg (has no administration in time range)    Or  acetaminophen (TYLENOL) suppository 650 mg (has no administration in time range)  melatonin tablet 3 mg (has no administration in time range)  ondansetron (ZOFRAN) injection 4 mg (has no administration in time range)  naloxone [facility]) injection 0.4 mg (has no administration in time range)  fentaNYL (SUBLIMAZE) injection 25 mcg (has no administration in time range)  lactated ringers infusion (0 mLs Intravenous Stopped 09/17/22 0813)  rosuvastatin (CRESTOR) tablet 5 mg (5 mg Oral Given 09/17/22 1050)  tamsulosin (FLOMAX) capsule 0.4 mg (0.4 mg Oral Given 09/17/22 1050)  sodium chloride 0.9 % bolus 500 mL (0 mLs Intravenous Stopped 09/16/22 1910)  traMADol (ULTRAM) tablet 50 mg (50 mg Oral Given 09/16/22 1818)  ondansetron (ZOFRAN) injection 4 mg (4 mg Intravenous Given 09/16/22 1814)  sodium chloride 0.9 % bolus 500 mL (0 mLs Intravenous  Stopped 09/16/22 2103)  sodium chloride 0.9 % bolus 1,000 mL (0 mLs Intravenous Stopped 09/17/22 0203)  ibuprofen (ADVIL) tablet 800 mg (800 mg Oral Given 09/16/22 2311)  ondansetron (ZOFRAN) injection 4 mg (4 mg Intravenous Given 09/16/22 2310)  lidocaine-EPINEPHrine (XYLOCAINE W/EPI) 1 %-1:100000 (with pres) injection 20 mL (20 mLs Other Given by Other 09/16/22 2314)    Mobility walks     Focused Assessments Cardiac Assessment Handoff:    No results found for: "CKTOTAL", "CKMB", "CKMBINDEX", "TROPONINI" No results found for: "DDIMER" Does the Patient currently have chest pain? No    R Recommendations: See Admitting Provider Note  Report given to:   Additional Notes:

## 2022-09-17 NOTE — H&P (Signed)
History and Physical      Charles Norton WUJ:[id number] DOB: 1940-09-29 DOA: 09/16/2022 Date of Service: 09/17/22  PCP: Charles Norton Patient coming from: home   I have personally briefly reviewed patient's old medical records in [facility]  Chief Complaint: Generalized weakness  HPI: Charles Norton is a 82 y.o. male with medical history significant for left total knee arthroplasty in 2018 BPH, essential pretension, hyperlipidemia, who is admitted to [facility] on 09/16/2022 with acute kidney injury after presenting from home to [facility] ED complaining of generalized weakness.   The patient reports 1 to 2 days of generalized weakness in the absence of any acute focal weakness.  He notes that he has been experiencing some worsening pain, swelling associated with his left knee over the last day, in the context of a history of left total knee arthroplasty in 2018 performed by Dr. [name] of Orthopedic surgery.  He attempted to play tennis earlier today, but noted to be slightly more difficult due to pain control as it relates to his left knee discomfort.  No overt preceding trauma over the last few days involving the left knee.  Denies any significant erythema associated with the left knee.  Denies any associated subjective fever, chills, rigors, or generalized myalgias.  He is also been extensively working outside over the last few days, noting that he has not significantly increased his oral intake of water in spite of the increasing ambient temperatures.  No recent chest pain, shortness of breath, palpitations, diaphoresis.  He has noted some nausea resulting in 1-2 episodes of nonbloody, nonbilious emesis over the last 1 to 2 days.  No associated any abdominal pain, diarrhea, rash.  Denies any recent headache, neck stiffness, cough, shortness of breath.  No recent dysuria or gross hematuria.     ED Course:  Vital signs in the ED were notable for the following: Afebrile; heart rate  initially in the low 100s, socially decrease in the 90s following interval administration of IV fluids, as further clinical below; initial blood pressure 95/59, socially improving to 141/79 following interval IV fluids; respiratory rate 15-21, oxygen saturation 97 to 99% on room air.  Labs were notable for the following: CMP notable for the following: Sodium 138, bicarbonate 21, and gap 9, creatinine 1.66 compared to most recent prior serum creatinine did point of 1.11 in June 2020, glucose 99, liver enzymes within normal limits.  Lipase 25.  High-sensitivity troponin I x 2 values were both found to be 5.  CBC notable for white cell count 14,595 centrals, hemoglobin 12.2.  ESR, CRP pending.  COVID, influenza, RSV PCR all negative.  EDP performed left knee arthrocentesis, with synovial fluid analysis pending at this time, including synovial fluid cell count with differential, crystals, Gram stain, culture.  Per my interpretation, EKG in ED demonstrated the following: Sinus rhythm with heart rate 99, incomplete right bundle branch block, left anterior fascicular block, nonspecific T wave version in aVL, no evidence of ST changes, including no evidence of ST elevation.  Imaging and additional notable ED work-up: Plain films of the left knee showed surgical changes associated with total knee arthroplasty, will showed no evidence of failure nor any evidence of joint effusion.  Chest x-ray, per formal radiology read, showed mild elevation of left hemidiaphragm with left basilar atelectasis and small left pleural effusion, demonstrated no evidence of infiltrate, edema, or pneumothorax.  EDP discussed with on-call orthopedic surgeon, Dr. Odis Hollingshead, Who recommended CT of the left  knee to evaluate for any evidence of periprosthetic fracture or Dr. Odis Hollingshead requested that we refrain from interval antibiotics, and conveyed to Dr. [name] will formally consult and see the patient in the morning.   CT left knee  ordered, with result currently pending.  While in the ED, the following were administered: Ibuprofen 800 mg p.o. x 1 dose, Zofran 4 mg IV x 2 doses, tramadol 50 mg p.o. x 1 dose, normal symptoms through the bolus.  Subsequently, the patient was admitted for further evaluation and management of presenting acute kidney injury in the setting of dehydration, after presenting with generalized weakness and left knee discomfort.     Review of Systems: As per HPI otherwise 10 point review of systems negative.   Past Medical History:  Diagnosis Date   Adenomatous colon polyp    Anemia    Arthritis    Basal cell carcinoma    BPH (benign prostatic hyperplasia)    Cataract of left eye    due to trauma in left eye   Diverticulosis    in sigmoid colon and in the descending colon   Hemorrhoids    Hypercholesterolemia    Hypertension    Iron deficiency    anemia   Lichen planus    of tongue   Osteoarthritis    PONV (postoperative nausea and vomiting)    Rectal prolapse    Shingles    right ophthalmic involvement   Tinnitus     Past Surgical History:  Procedure Laterality Date   COLONOSCOPY     2010 and 2020   INGUINAL HERNIA REPAIR Bilateral    KNEE SURGERY     arthrosocopy    removal of cyst     sphenoid cyst    ROTATOR CUFF REPAIR Bilateral    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   Procedure: LEFT TOTAL KNEE ARTHROPLASTY;  Surgeon: Durene Romans, MD;  Location: [facility];  Service: Orthopedics;  Laterality: Left;  70 mins with block    Social History:  reports that he has never smoked. He has never used smokeless tobacco. He reports current alcohol use of about 12.0 standard drinks of alcohol per week. He reports that he does not use drugs.   Allergies  Allergen Reactions   Other Nausea And Vomiting    All pain medications.  Can tolerate if anti-nausea medication first     Family History  Problem Relation Age of Onset   Tuberculosis Mother    Hypertension Father     Heart disease Father    AAA (abdominal aortic aneurysm) Father    Obesity Brother    Colon cancer Neg Hx    Esophageal cancer Neg Hx    Pancreatic cancer Neg Hx    Stomach cancer Neg Hx    Liver disease Neg Hx     Family history reviewed and not pertinent    Prior to Admission medications   Medication Sig Start Date End Date Taking? Authorizing Provider  Ascorbic Acid (VITAMIN C) 1000 MG tablet Take 1,000 mg by mouth daily.    [provider]  Cholecalciferol (VITAMIN D3) 50 MCG (2000 UT) TABS Take 1 tablet by mouth daily.    [provider]  Glucosamine-Chondroitin (GLUCOSAMINE CHONDR COMPLEX PO) Take 1,500 mg by mouth daily.     [provider]  losartan (COZAAR) 50 MG tablet Take 50 mg by mouth daily. 03/12/20   [provider]  magnesium oxide (MAG-OX) 400 MG tablet Take 400 mg by mouth daily.  [provider]  Multiple Vitamin (MULTI VITAMIN DAILY PO) Take 1 tablet by mouth daily.     [provider]  Omega-3 Fatty Acids (FISH OIL) 1000 MG CAPS Take 1 capsule by mouth daily.    [provider]  Potassium 99 MG TABS Take 99 mg by mouth daily.     [provider]  rosuvastatin (CRESTOR) 5 MG tablet Take 5 mg by mouth daily. 03/12/20   [provider]  Specialty Vitamins Products (ICAPS LUTEIN-ZEAXANTHIN PO) Take 1 tablet by mouth daily.    [provider]  tamsulosin (FLOMAX) 0.4 MG CAPS capsule Take 0.4 mg by mouth daily. 03/12/20   [provider]  Turmeric 500 MG CAPS Take 1,000 mg by mouth daily.    [provider]     Objective    Physical Exam: Vitals:   09/16/22 2300 09/16/22 2325 09/16/22 2330 09/16/22 2345  BP: 101/63  107/61 (!) 141/79  Pulse: 92  97 62  Resp: (!) 27  19 (!) 26  Temp:  99.9 F (37.7 C)    TempSrc:  Oral    SpO2: 98%  99% 97%  Weight:      Height:        General: appears to be stated age; alert, oriented Skin: warm, dry, no  rash Head:  AT/[place] Mouth:  Oral mucosa membranes appear dry, normal dentition Neck: supple; trachea midline Heart:  RRR; did not appreciate any M/R/G Lungs: CTAB, did not appreciate any wheezes, rales, or rhonchi Abdomen: + BS; soft, ND, NT Vascular: 2+ pedal pulses b/l; 2+ radial pulses b/l Extremities: Mild gross swelling of left knee relative to right counterpart, no muscle wasting Neuro: strength and sensation intact in upper and lower extremities b/l    Labs on Admission: I have personally reviewed following labs and imaging studies  CBC: Recent Labs  Lab 09/16/22 1712  WBC 14.9*  NEUTROABS 14.1*  HGB 12.2*  HCT 36.1*  MCV 96.8  PLT 197   Basic Metabolic Panel: Recent Labs  Lab 09/16/22 1712  NA 138  K 4.2  CL 108  CO2 21*  GLUCOSE 99  BUN 17  CREATININE 1.66*  CALCIUM 9.0   GFR: Estimated Creatinine Clearance: 35.5 mL/min (A) (by C-G formula based on SCr of 1.66 mg/dL (H)). Liver Function Tests: Recent Labs  Lab 09/16/22 1712  AST 26  ALT 17  ALKPHOS 42  BILITOT 1.1  PROT 5.6*  ALBUMIN 3.5   Recent Labs  Lab 09/16/22 1712  LIPASE 25   No results for input(s): "AMMONIA" in the last 168 hours. Coagulation Profile: No results for input(s): "INR", "PROTIME" in the last 168 hours. Cardiac Enzymes: No results for input(s): "CKTOTAL", "CKMB", "CKMBINDEX", "TROPONINI" in the last 168 hours. BNP (last 3 results) No results for input(s): "PROBNP" in the last 8760 hours. HbA1C: No results for input(s): "HGBA1C" in the last 72 hours. CBG: Recent Labs  Lab 09/16/22 1709  GLUCAP 99   Lipid Profile: No results for input(s): "CHOL", "HDL", "LDLCALC", "TRIG", "CHOLHDL", "LDLDIRECT" in the last 72 hours. Thyroid Function Tests: No results for input(s): "TSH", "T4TOTAL", "FREET4", "T3FREE", "THYROIDAB" in the last 72 hours. Anemia Panel: No results for input(s): "VITAMINB12", "FOLATE", "FERRITIN", "TIBC", "IRON", "RETICCTPCT" in the last 72 hours. Urine  analysis:    Component Value Date/Time   COLORURINE YELLOW 09/16/2022 1703   APPEARANCEUR CLEAR 09/16/2022 1703   LABSPEC 1.018 09/16/2022 1703   PHURINE 6.0 09/16/2022 1703   GLUCOSEU NEGATIVE 09/16/2022  1703   HGBUR NEGATIVE 09/16/2022 1703   BILIRUBINUR NEGATIVE 09/16/2022 1703   KETONESUR NEGATIVE 09/16/2022 1703   PROTEINUR NEGATIVE 09/16/2022 1703   NITRITE NEGATIVE 09/16/2022 1703   LEUKOCYTESUR NEGATIVE 09/16/2022 1703    Radiological Exams on Admission: CT Knee Left Wo Contrast  Result Date: 09/17/2022 CLINICAL DATA:  Knee replacement, periprosthetic fracture suspected EXAM: CT OF THE LEFT KNEE WITHOUT CONTRAST TECHNIQUE: Multidetector CT imaging of the left knee was performed according to the standard protocol. Multiplanar CT image reconstructions were also generated. RADIATION DOSE REDUCTION: This exam was performed according to the departmental dose-optimization program which includes automated exposure control, adjustment of the mA and/or kV according to patient size and/or use of iterative reconstruction technique. COMPARISON:  None Available. FINDINGS: Bones/Joint/Cartilage Surgical changes of left total knee arthroplasty are identified. Normal alignment. No acute fracture or dislocation. No periprosthetic lucency or hardware fracture identified. No osseous erosions. 16 x 12 mm intra-articular loose body noted within the suprapatellar recess. Ligaments Suboptimally assessed by CT. Muscles and Tendons Normal muscle bulk. Quadriceps tendon is intact. Patellar tendon is obscured partially by streak artifact. The visualized portion is unremarkable. Soft tissues Capsular calcifications are seen within the suprapatellar recess. Moderate left knee effusion. IMPRESSION: 1. Surgical changes of left total knee arthroplasty without evidence of fracture or hardware complication. 2. 16 x 12 mm intra-articular loose body within the suprapatellar recess. 3. Moderate left knee effusion.  Electronically Signed   By: Helyn Numbers M.D.   On: 09/17/2022 00:41   DG Chest Portable 1 View  Result Date: 09/16/2022 CLINICAL DATA:  Syncope EXAM: PORTABLE CHEST 1 VIEW COMPARISON:  None Available. FINDINGS: Mild elevation of the left hemidiaphragm with left basilar linear atelectasis. Lungs are otherwise clear. No pneumothorax. Tiny left pleural effusion. Cardiac size within normal limits. Pulmonary vascularity is normal. No acute bone abnormality. IMPRESSION: 1. Mild elevation of the left hemidiaphragm with left basilar atelectasis. Tiny left pleural effusion. Electronically Signed   By: Helyn Numbers M.D.   On: 09/16/2022 19:32   DG Knee Left Port  Result Date: 09/16/2022 CLINICAL DATA:  Pain EXAM: PORTABLE LEFT KNEE - 2 VIEW COMPARISON:  None Available. FINDINGS: Portable x-rays demonstrates total knee arthroplasty with cemented femoral and tibial component. Patellar button. Expected alignment. No hardware failure. Osteopenia. Soft tissue calcifications are seen surrounding the knee. No joint effusion. IMPRESSION: Surgical changes of total knee arthroplasty. Electronically Signed   By: Karen Kays M.D.   On: 09/16/2022 17:58      Assessment/Plan   Principal Problem:   AKI (acute kidney injury) Active Problems:   Hyperlipidemia   Generalized weakness   Dehydration   Left knee pain   SIRS (systemic inflammatory response syndrome)   BPH (benign prostatic hyperplasia)   Essential hypertension     #) Acute Kidney Injury:  as quantified above.  With presenting urinalysis with microscopy showing no evidence of significant urinary casts.  Suspect that this is prerenal in nature, in the setting of dehydration as evidenced by improving tachycardia improving soft blood pressures with IV fluids.  Additionally, in setting of presenting generalized weakness, will also add on CPK level.  Potential pharmacologic exacerbation in the form of outpatient losartan.  Plan: monitor strict I's &  O's and daily weights. Attempt to avoid nephrotoxic agents.  Hold home losartan.  Refrain from NSAIDs. Repeat CMP in the morning. Check serum magnesium level. Add-on random urine sodium and random urine creatinine.  Check CPK level.  Continue Ringer's at 100  cc/h x 12 hours.               #) Generalized weakness: 1-2 day duration of generalized weakness, in the absence of any evidence of acute focal neurologic deficits, including no evidence of acute focal weakness to suggest acute CVA.  Suspect contribution from physiologic stress stemming from presenting dehydration resulting in acute kidney injury.  No e/o additional infectious process at this time, including urinalysis that was inconsistent with UTI, while chest x-ray showed no evidence of pneumonia, and COVID, influenza, RSV PCR all negative.  Will further evaluate for septic left knee, with synovial fluid analysis results currently pending, and orthopedic surgery recommending refraining from interval antibiotics leading up to their formal consultation in the morning.  Will further eval for any additional contributions from endocrine/metabolic sources, as detailed below.    Plan: work-up and management of presenting acute kidney injury, as described above. PT/OT consults ordered for the AM. Fall precautions. CMP/CBC in the AM. Check TSH, serum Mg level. Check CPK level.  Further evaluation and management of presenting left knee discomfort, as below.  Gentle IV fluids, as above.               #) Dehydration: Clinical suspicion for such, including the appearance of dry oral mucous membranes as well as improvement in initial tachycardia and improvement in soft initial blood pressures with IV fluids, as further quantified above.  appears to be in the setting of   ostensible fluid losses in the form of increased activity outside over the last few days with increasing ambient temperatures, without corresponding increase in intake of  water over that timeframe.  Status post administration of 2 L of IV fluids in the ED.  Will continue with additional IV fluid resuscitative efforts, as further quantified below.   Plan: Monitor strict I's and O's.  Daily weights.  CMP in the morning. IVF's in form of lactated Ringer's at 100 cc/h x 12 hours.  Further evaluation management of presenting acute kidney injury, as above.Marland Kitchen                #) SIRS criteria present: Presentation associated with leukocytosis, tachycardia, tachypnea.  However, in the absence of e/o underlying infection at this time, as outlined above, criteria for sepsis not currently met.  suspect non-infectious factors contributing to these SIRS findings, including hemoconcentrating/inflammatory contributions towards elevated white blood cell count in the setting of evidence of dehydration as well as the degree of inflammation associated with his left knee. patient appears hemodynamically stable at this time.  Consequently, will refrain from initiation of IV antibiotics at this time, which sounds consistent with recommendation from orthopedic surgery, to hold off on antibiotics pending their formal consultation and evaluation of the left knee in the morning, as further detailed above.    Plan: Repeat CBC with diff in the AM.  Monitor strict I's and O's and daily weights.  Monitor on telemetry. Refraining from IV abx for now, as above.  Formal orthopedic surgery consultation regarding left knee discomfort, as above.  Abnormal calcitonin level.  IV fluids, as above.              #) Acute left knee pain: New onset left knee discomfort over the course of the last few days, with gross inflammation, but without overt evidence of inflammatory arthropathy at this point.  Status post left knee arthrocentesis in the ED today, with associated synovial fluid analysis currently pending.  EDP discussed patient's case with on-call  orthopedic surgery, who will formally  consult, recommending interval CT of the left knee to evaluate for any periprosthetic fracture, will also requesting that we refrain from IV antibiotics pending their formal evaluation/consultation in the morning.  Plan: Orthopedics injuried formally consult, as above.  CT left knee, per the request.  Refraining from IV antibiotics per orthopedic surgery request.  Will follow for results of synovial fluid analysis, as further detailed above.  Prn IV fentanyl.  Follow-up results of ESR, CRP.  CMP, CBC in the morning.  Refraining from NSAIDs in the context of presenting acute kidney injury.                #) Benign Prostatic Hyperplasia:  documented h/o such; on tamsulosin as outpatient.   Plan: monitor strict I's & O's and daily weights. Repeat CMP in AM. continue home tamsulosin.                #) Essential Hypertension: documented h/o such, with outpatient antihypertensive regimen including losartan.  SBP's in the ED today: Initially soft, with systolic blood pressures in the 90s mmHg, which have improved into the 140s following interval administration of IV fluids, as further detailed above.  In the context of presenting initial soft blood pressures as well as acute kidney injury, will hold home losartan for now.  Plan: Close monitoring of subsequent BP via routine VS. hold home losartan for now, as above.               #) Hyperlipidemia: documented h/o such. On rosuvastatin as outpatient.  In the setting of presenting generalized weakness and acute kidney injury, will also add on CPK level.  Plan: continue home statin.  Add on CPK level, as above.     DVT prophylaxis: SCD's   Code Status: Full code Family Communication: none Disposition Plan: Per Rounding Team Consults called: EDP discussed patient's case with the on-call orthopedic surgeon, Dr. Odis Hollingshead, Who conveyed that orthopedic surgery will formally consult, with Dr. [name] to see the patient in  the morning (4/24);  Admission status: Inpatient     I SPENT GREATER THAN 75  MINUTES IN CLINICAL CARE TIME/MEDICAL DECISION-MAKING IN COMPLETING THIS ADMISSION.      Chaney Born Miliana Gangwer DO [facility]  From 7PM - 7AM   09/17/2022, 1:02 AM

## 2022-09-17 NOTE — ED Notes (Signed)
EDP notified of Glucose <20 in synovial knee fluid.

## 2022-09-17 NOTE — Evaluation (Signed)
Physical Therapy Evaluation Patient Details Name: Charles Norton MRN: [id number] DOB: 12/19/1940 Today's Date: 09/17/2022  History of Present Illness  Patient is 82 y.o. male who admitted to [facility] on 09/16/2022 with AKI after presenting from home to ED complaining of generalized weakness and Lt knee pain and swelling. PMH significant for Lt TKA in 2018, BPH, HTN, HLD,   Clinical Impression  Charles Norton is 82 y.o. male admitted with above HPI and diagnosis. Patient is currently limited by functional impairments below (see PT problem list). Patient lives with his wife and is independent at baseline. He currently requires Mod assist for bed mobility and Min assist for sit<>stands and gait with RW. Gait distance limited this session as pt is avoiding WB on Lt LE due to severe knee pain. Patient will benefit from continued skilled PT interventions to address impairments and progress independence with mobility. Acute PT will follow and progress as able.        Recommendations for follow up therapy are one component of a multi-disciplinary discharge planning process, led by the attending physician.  Recommendations may be updated based on patient status, additional functional criteria and insurance authorization.  Follow Up Recommendations       Assistance Recommended at Discharge Frequent or constant Supervision/Assistance  Patient can return home with the following  A little help with walking and/or transfers;A little help with bathing/dressing/bathroom;Assistance with cooking/housework;Direct supervision/assist for medications management;Assist for transportation;Help with stairs or ramp for entrance    Equipment Recommendations Rolling walker (2 wheels)  Recommendations for Other Services       Functional Status Assessment Patient has had a recent decline in their functional status and demonstrates the ability to make significant improvements in function in a reasonable and predictable amount of  time.     Precautions / Restrictions Precautions Precautions: Fall Precaution Comments: BP soft Restrictions Weight Bearing Restrictions: No      Mobility  Bed Mobility Overal bed mobility: Needs Assistance Bed Mobility: Supine to Sit, Sit to Supine     Supine to sit: Mod assist, HOB elevated Sit to supine: Min assist      pt required ceus and mod assist to bring Lt LE off EOB and maintain seated balance. Pt sitting crooked at EOB and required cues for safety due to posterior leaning posture. EOS min assist to bring Lt LE onto bed and return to supine.   Transfers Overall transfer level: Needs assistance Equipment used: Rolling walker (2 wheels) Transfers: Sit to/from Stand Sit to Stand: Min assist            min assist for power up and to steady balance as pt avoiding WB on Lt LE due to pain. Pt to    Ambulation/Gait Ambulation/Gait assistance: Min assist Gait Distance (Feet): 5 Feet Assistive device: Rolling walker (2 wheels) Gait Pattern/deviations: Decreased weight shift to left, Step-to pattern Gait velocity: decr    pt took small lateral hops/steps alogn EOB and forward/backward with RW and min assist. Pt avoiding WB on Lt LE due to pain.     Stairs            Wheelchair Mobility    Modified Rankin (Stroke Patients Only)       Balance Overall balance assessment: Needs assistance Sitting-balance support: Feet supported, Bilateral upper extremity supported Sitting balance-Leahy Scale: Fair Sitting balance - Comments: reliant on UE support at edge of stretcher Postural control: Posterior lean Standing balance support: During functional activity, Bilateral upper extremity supported Standing  balance-Leahy Scale: Poor                               Pertinent Vitals/Pain Pain Assessment Pain Assessment: 0-10 Pain Score: 4  Pain Location: knee pain (also reports headache of 2-3/10) Pain Descriptors / Indicators: Aching Pain  Intervention(s): Limited activity within patient's tolerance, Monitored during session, Repositioned    Home Living Family/patient expects to be discharged to:: Private residence Living Arrangements: Spouse/significant other Available Help at Discharge: Family Type of Home: House Home Access: Stairs to enter Entrance Stairs-Rails: Right Entrance Stairs-Number of Steps: 2+1   Home Layout: One level Home Equipment: BSC/3in1 (might have RW, has a donation closet at church)      Prior Function Prior Level of Function : Independent/Modified Independent             Mobility Comments: Pt played tennis the morning PTA, has been mobilizing independently with no AD ADLs Comments: Independent     Hand Dominance   Dominant Hand: Right    Extremity/Trunk Assessment   Upper Extremity Assessment Upper Extremity Assessment: Defer to OT evaluation;Overall WFL for tasks assessed    Lower Extremity Assessment Lower Extremity Assessment: LLE deficits/detail LLE: Unable to fully assess due to pain LLE Sensation: WNL LLE Coordination: WNL    Cervical / Trunk Assessment Cervical / Trunk Assessment: Normal  Communication   Communication: No difficulties  Cognition Arousal/Alertness: Awake/alert Behavior During Therapy: WFL for tasks assessed/performed Overall Cognitive Status: Within Functional Limits for tasks assessed                                          General Comments      Exercises     Assessment/Plan    PT Assessment Patient needs continued PT services  PT Problem List Decreased strength;Decreased range of motion;Decreased activity tolerance;Decreased balance;Decreased mobility;Decreased knowledge of use of DME;Decreased knowledge of precautions;Pain       PT Treatment Interventions DME instruction;Gait training;Stair training;Functional mobility training;Therapeutic activities;Therapeutic exercise;Balance training;Neuromuscular  re-education;Patient/family education    PT Goals (Current goals can be found in the Care Plan section)  Acute Rehab PT Goals Patient Stated Goal: regain independence PT Goal Formulation: With patient Time For Goal Achievement: 10/01/22 Potential to Achieve Goals: Good    Frequency Min 4X/week     Co-evaluation               AM-PAC PT "6 Clicks" Mobility  Outcome Measure Help needed turning from your back to your side while in a flat bed without using bedrails?: A Little Help needed moving from lying on your back to sitting on the side of a flat bed without using bedrails?: A Lot Help needed moving to and from a bed to a chair (including a wheelchair)?: A Little Help needed standing up from a chair using your arms (e.g., wheelchair or bedside chair)?: A Little Help needed to walk in hospital room?: A Little Help needed climbing 3-5 steps with a railing? : Total 6 Click Score: 15    End of Session Equipment Utilized During Treatment: Gait belt Activity Tolerance: Patient tolerated treatment well Patient left: in bed;with call bell/phone within reach Nurse Communication: Mobility status PT Visit Diagnosis: Unsteadiness on feet (R26.81);Other abnormalities of gait and mobility (R26.89);Muscle weakness (generalized) (M62.81);Difficulty in walking, not elsewhere classified (R26.2)    Time: 1610-9604  PT Time Calculation (min) (ACUTE ONLY): 24 min   Charges:   PT Evaluation $PT Eval Low Complexity: 1 Low PT Treatments $Therapeutic Activity: 8-22 mins        Wynn Maudlin, DPT Acute Rehabilitation Services Office [phone]  09/17/22 12:23 PM

## 2022-09-18 ENCOUNTER — Other Ambulatory Visit (HOSPITAL_COMMUNITY): Payer: Self-pay

## 2022-09-18 DIAGNOSIS — N179 Acute kidney failure, unspecified: Secondary | ICD-10-CM | POA: Diagnosis not present

## 2022-09-18 LAB — CBC WITH DIFFERENTIAL/PLATELET
Abs Immature Granulocytes: 0.06 10*3/uL (ref 0.00–0.07)
Basophils Absolute: 0 10*3/uL (ref 0.0–0.1)
Basophils Relative: 0 %
Eosinophils Absolute: 0 10*3/uL (ref 0.0–0.5)
Eosinophils Relative: 0 %
HCT: 32.4 % — ABNORMAL LOW (ref 39.0–52.0)
Hemoglobin: 11 g/dL — ABNORMAL LOW (ref 13.0–17.0)
Immature Granulocytes: 1 %
Lymphocytes Relative: 3 %
Lymphs Abs: 0.3 10*3/uL — ABNORMAL LOW (ref 0.7–4.0)
MCH: 32.4 pg (ref 26.0–34.0)
MCHC: 34 g/dL (ref 30.0–36.0)
MCV: 95.3 fL (ref 80.0–100.0)
Monocytes Absolute: 0.6 10*3/uL (ref 0.1–1.0)
Monocytes Relative: 5 %
Neutro Abs: 10.2 10*3/uL — ABNORMAL HIGH (ref 1.7–7.7)
Neutrophils Relative %: 91 %
Platelets: 134 10*3/uL — ABNORMAL LOW (ref 150–400)
RBC: 3.4 MIL/uL — ABNORMAL LOW (ref 4.22–5.81)
RDW: 13.9 % (ref 11.5–15.5)
WBC: 11.1 10*3/uL — ABNORMAL HIGH (ref 4.0–10.5)
nRBC: 0 % (ref 0.0–0.2)

## 2022-09-18 LAB — C-REACTIVE PROTEIN: CRP: 31.4 mg/dL — ABNORMAL HIGH (ref <1.0)

## 2022-09-18 LAB — BASIC METABOLIC PANEL
Anion gap: 4 — ABNORMAL LOW (ref 5–15)
BUN: 23 mg/dL (ref 8–23)
CO2: 21 mmol/L — ABNORMAL LOW (ref 22–32)
Calcium: 8.2 mg/dL — ABNORMAL LOW (ref 8.9–10.3)
Chloride: 107 mmol/L (ref 98–111)
Creatinine, Ser: 1.35 mg/dL — ABNORMAL HIGH (ref 0.61–1.24)
GFR, Estimated: 53 mL/min — ABNORMAL LOW (ref >60)
Glucose, Bld: 141 mg/dL — ABNORMAL HIGH (ref 70–99)
Potassium: 4 mmol/L (ref 3.5–5.1)
Sodium: 132 mmol/L — ABNORMAL LOW (ref 135–145)

## 2022-09-18 LAB — MRSA NEXT GEN BY PCR, NASAL: MRSA by PCR Next Gen: NOT DETECTED

## 2022-09-18 LAB — SEDIMENTATION RATE: Sed Rate: 55 mm/hr — ABNORMAL HIGH (ref 0–16)

## 2022-09-18 LAB — PROCALCITONIN: Procalcitonin: 3.78 ng/mL

## 2022-09-18 MED ORDER — DOXYCYCLINE HYCLATE 100 MG PO TABS
100.0000 mg | ORAL_TABLET | Freq: Two times a day (BID) | ORAL | Status: DC
  Administered 2022-09-18: 100 mg via ORAL
  Filled 2022-09-18: qty 1

## 2022-09-18 MED ORDER — DOXYCYCLINE HYCLATE 100 MG PO TABS
100.0000 mg | ORAL_TABLET | Freq: Two times a day (BID) | ORAL | 0 refills | Status: DC
  Filled 2022-09-18: qty 20, 10d supply, fill #0

## 2022-09-18 MED ORDER — LACTATED RINGERS IV BOLUS
1000.0000 mL | Freq: Once | INTRAVENOUS | Status: AC
  Administered 2022-09-18: 1000 mL via INTRAVENOUS

## 2022-09-18 MED ORDER — BUPIVACAINE HCL (PF) 0.5 % IJ SOLN
10.0000 mL | Freq: Once | INTRAMUSCULAR | Status: AC
  Administered 2022-09-18: 10 mL
  Filled 2022-09-18: qty 10

## 2022-09-18 MED ORDER — ACETAMINOPHEN 500 MG PO TABS
500.0000 mg | ORAL_TABLET | Freq: Four times a day (QID) | ORAL | 0 refills | Status: DC | PRN
  Filled 2022-09-18: qty 20, 5d supply, fill #0

## 2022-09-18 MED ORDER — TRAMADOL HCL 50 MG PO TABS
50.0000 mg | ORAL_TABLET | Freq: Three times a day (TID) | ORAL | 0 refills | Status: DC | PRN
  Filled 2022-09-18: qty 20, 7d supply, fill #0

## 2022-09-18 NOTE — Discharge Summary (Addendum)
Charles Norton:[id number] DOB: 10-May-1941 DOA: 09/16/2022  PCP: Merri Brunette, MD  Admit date: 09/16/2022  Discharge date: 09/18/2022  Admitted From: Home   Disposition:  Home   Recommendations for Outpatient Follow-up:   Follow up with PCP in 1-2 weeks  PCP Please obtain BMP/CBC, 2 view CXR in 1week,  (see Discharge instructions)   PCP Please follow up on the following pending results:   Monitor blood pressure, BMP, left knee infection closely.  Please try to see him within a week of discharge.  Sooner if possible.   Home Health: PT, RN   Equipment/Devices: has crutches  Consultations: Orthopedics, also discussed with Dr. [name] directly on 09/18/2022 Discharge Condition: Stable    CODE STATUS: Full    Diet Recommendation: Heart Healthy     Chief Complaint  Patient presents with   Knee Pain   Near Syncope     Brief history of present illness from the day of admission and additional interim summary    82 y.o. male with medical history significant for left total knee arthroplasty in 2018 BPH, essential pretension, hyperlipidemia, who is admitted to [facility] on 09/16/2022 with acute kidney injury after presenting from home to [facility] ED complaining of generalized weakness.                                                                  Hospital Course   Dehydration with AKI.  Much improved.  Offending medication which is ARB along with oral potassium has been held, AKI likely prompted by poor oral intake due to left knee pain and infection, post hydration renal function much improved blood pressure improving, will get another liter of IV fluids today prior to discharge, will continue to hold ARB upon discharge till he follows up with his PCP and gets his BMP and blood pressure rechecked.  Home RN also  ordered.  Patient eager to go home.  Currently symptom-free except for left knee pain. DW wife plan.  Left knee periprosthetic infection.  Seen by orthopedics, knee was tapped, fluid evaluated, case discussed with patient's primary orthopedic surgeon Dr. [name] by me directly on 09/18/2022, he does not want patient to stay in the hospital or get any surgical intervention at this time, does not recommend any antibiotics but okay for discharge him on oral doxycycline which I will.  Patient has been strictly instructed to come back to the ER if he has any fever chills or worsening of infection.  He will be following with Dr. [name] within the week along with his PCP.  Already has crutches which she will use for weightbearing, home PT and RN also ordered if he qualifies.  No sepsis on admission.  BPH - continue Flomax.  Dyslipidemia.  Continue home statin    Discharge  diagnosis     Principal Problem:   AKI (acute kidney injury) Active Problems:   Hyperlipidemia   Generalized weakness   Dehydration   Pain and swelling of knee, left   SIRS (systemic inflammatory response syndrome)   BPH (benign prostatic hyperplasia)   Essential hypertension    Discharge instructions    Discharge Instructions     Diet - low sodium heart healthy   Complete by: As directed    Discharge instructions   Complete by: As directed    If you develop any fever, dizziness, fall and blood pressure call 911 or come to the ER right away.   Follow with Primary MD Merri Brunette, MD in 1-2 days   Get CBC, CMP, ER, CRP -  checked next visit with your primary MD   Activity: L.Leg weight bearing as tolerated with full fall precautions use walker/cane & assistance as needed  Disposition Home    Diet: Heart Healthy   Special Instructions: If you have smoked or chewed Tobacco  in the last 2 yrs please stop smoking, stop any regular Alcohol  and or any Recreational drug use.  On your next visit with your primary care  physician please Get Medicines reviewed and adjusted.  Please request your Prim.MD to go over all Hospital Tests and Procedure/Radiological results at the follow up, please get all Hospital records sent to your Prim MD by signing hospital release before you go home.  If you experience worsening of your admission symptoms, develop shortness of breath, life threatening emergency, suicidal or homicidal thoughts you must seek medical attention immediately by calling 911 or calling your MD immediately  if symptoms less severe.  You Must read complete instructions/literature along with all the possible adverse reactions/side effects for all the Medicines you take and that have been prescribed to you. Take any new Medicines after you have completely understood and accpet all the possible adverse reactions/side effects.   Increase activity slowly   Complete by: As directed        Discharge Medications   Allergies as of 09/18/2022       Reactions   Other Nausea And Vomiting   All pain medications.  Can tolerate if anti-nausea medication first        Medication List     STOP taking these medications    losartan 50 MG tablet Commonly known as: COZAAR   Potassium 99 MG Tabs       TAKE these medications    acetaminophen 500 MG tablet Commonly known as: TYLENOL Take 1 tablet (500 mg total) by mouth every 6 (six) hours as needed for moderate pain.   doxycycline 100 MG tablet Commonly known as: VIBRA-TABS Take 1 tablet (100 mg total) by mouth every 12 (twelve) hours.   Fish Oil 1000 MG Caps Take 1 capsule by mouth daily.   ICAPS LUTEIN-ZEAXANTHIN PO Take 1 tablet by mouth daily.   magnesium oxide 400 MG tablet Commonly known as: MAG-OX Take 400 mg by mouth daily.   MULTI VITAMIN DAILY PO Take 1 tablet by mouth daily.   rosuvastatin 5 MG tablet Commonly known as: CRESTOR Take 5 mg by mouth at bedtime.   tamsulosin 0.4 MG Caps capsule Commonly known as: FLOMAX Take 0.4  mg by mouth at bedtime.   traMADol 50 MG tablet Commonly known as: Ultram Take 1 tablet (50 mg total) by mouth every 8 (eight) hours as needed.   Turmeric 500 MG Caps Take 1,000 mg by mouth daily.  vitamin C 1000 MG tablet Take 1,000 mg by mouth daily.   Vitamin D3 50 MCG (2000 UT) Tabs Take 1 tablet by mouth daily.               Durable Medical Equipment  (From admission, onward)           Start     Ordered   09/18/22 0821  For home use only DME Walker rolling  Once       Question Answer Comment  Walker: With 5 Inch Wheels   Patient needs a walker to treat with the following condition Weakness      09/18/22 0820             Follow-up Information     Merri Brunette, MD. Schedule an appointment as soon as possible for a visit in 2 day(s).   Specialty: Family Medicine Contact information: [phone] [address] [place] [address] [phone]         Durene Romans, MD. Schedule an appointment as soon as possible for a visit in 1 day(s).   Specialty: Orthopedic Surgery Contact information: [address] [place] [address] [phone]                 Major procedures and Radiology Reports - PLEASE review detailed and final reports thoroughly  -       CT Knee Left Wo Contrast  Result Date: 09/17/2022 CLINICAL DATA:  Knee replacement, periprosthetic fracture suspected EXAM: CT OF THE LEFT KNEE WITHOUT CONTRAST TECHNIQUE: Multidetector CT imaging of the left knee was performed according to the standard protocol. Multiplanar CT image reconstructions were also generated. RADIATION DOSE REDUCTION: This exam was performed according to the departmental dose-optimization program which includes automated exposure control, adjustment of the mA and/or kV according to patient size and/or use of iterative reconstruction technique. COMPARISON:  None Available. FINDINGS: Bones/Joint/Cartilage Surgical changes of left total knee  arthroplasty are identified. Normal alignment. No acute fracture or dislocation. No periprosthetic lucency or hardware fracture identified. No osseous erosions. 16 x 12 mm intra-articular loose body noted within the suprapatellar recess. Ligaments Suboptimally assessed by CT. Muscles and Tendons Normal muscle bulk. Quadriceps tendon is intact. Patellar tendon is obscured partially by streak artifact. The visualized portion is unremarkable. Soft tissues Capsular calcifications are seen within the suprapatellar recess. Moderate left knee effusion. IMPRESSION: 1. Surgical changes of left total knee arthroplasty without evidence of fracture or hardware complication. 2. 16 x 12 mm intra-articular loose body within the suprapatellar recess. 3. Moderate left knee effusion. Electronically Signed   By: Helyn Numbers M.D.   On: 09/17/2022 00:41   DG Chest Portable 1 View  Result Date: 09/16/2022 CLINICAL DATA:  Syncope EXAM: PORTABLE CHEST 1 VIEW COMPARISON:  None Available. FINDINGS: Mild elevation of the left hemidiaphragm with left basilar linear atelectasis. Lungs are otherwise clear. No pneumothorax. Tiny left pleural effusion. Cardiac size within normal limits. Pulmonary vascularity is normal. No acute bone abnormality. IMPRESSION: 1. Mild elevation of the left hemidiaphragm with left basilar atelectasis. Tiny left pleural effusion. Electronically Signed   By: Helyn Numbers M.D.   On: 09/16/2022 19:32   DG Knee Left Port  Result Date: 09/16/2022 CLINICAL DATA:  Pain EXAM: PORTABLE LEFT KNEE - 2 VIEW COMPARISON:  None Available. FINDINGS: Portable x-rays demonstrates total knee arthroplasty with cemented femoral and tibial component. Patellar button. Expected alignment. No hardware failure. Osteopenia. Soft tissue calcifications are seen surrounding the knee. No joint  effusion. IMPRESSION: Surgical changes of total knee arthroplasty. Electronically Signed   By: Karen Kays M.D.   On: 09/16/2022 17:58     Micro Results    Recent Results (from the past 240 hour(s))  Resp panel by RT-PCR (RSV, Flu A&B, Covid) Anterior Nasal Swab     Status: None   Collection Time: 09/16/22  6:15 PM   Specimen: Anterior Nasal Swab  Result Value Ref Range Status   SARS Coronavirus 2 by RT PCR NEGATIVE NEGATIVE Final   Influenza A by PCR NEGATIVE NEGATIVE Final   Influenza B by PCR NEGATIVE NEGATIVE Final    Comment: (NOTE) The Xpert Xpress SARS-CoV-2/FLU/RSV plus assay is intended as an aid in the diagnosis of influenza from Nasopharyngeal swab specimens and should not be used as a sole basis for treatment. Nasal washings and aspirates are unacceptable for Xpert Xpress SARS-CoV-2/FLU/RSV testing.  Fact Sheet for Patients: BloggerCourse.com  Fact Sheet for Healthcare Providers: SeriousBroker.it  This test is not yet approved or cleared by the [place] FDA and has been authorized for detection and/or diagnosis of SARS-CoV-2 by FDA under an Emergency Use Authorization (EUA). This EUA will remain in effect (meaning this test can be used) for the duration of the COVID-19 declaration under Section 564(b)(1) of the Act, 21 U.S.C. section 360bbb-3(b)(1), unless the authorization is terminated or revoked.     Resp Syncytial Virus by PCR NEGATIVE NEGATIVE Final    Comment: (NOTE) Fact Sheet for Patients: BloggerCourse.com  Fact Sheet for Healthcare Providers: SeriousBroker.it  This test is not yet approved or cleared by the [place] FDA and has been authorized for detection and/or diagnosis of SARS-CoV-2 by FDA under an Emergency Use Authorization (EUA). This EUA will remain in effect (meaning this test can be used) for the duration of the COVID-19 declaration under Section 564(b)(1) of the Act, 21 U.S.C. section 360bbb-3(b)(1), unless the authorization is terminated or revoked.  Performed  at [facility], [address], [place], [place] [address]   Body fluid culture w Gram Stain     Status: None (Preliminary result)   Collection Time: 09/16/22 11:15 PM   Specimen: Body Fluid  Result Value Ref Range Status   Specimen Description FLUID  Final   Special Requests JOINT KNEE  Final   Gram Stain   Final    FEW WBC PRESENT, PREDOMINANTLY PMN FEW GRAM POSITIVE COCCI Performed at [facility], [address] [place], [place] [address]    Culture PENDING  Incomplete   Report Status PENDING  Incomplete  MRSA Next Gen by PCR, Nasal     Status: None   Collection Time: 09/18/22  6:00 AM   Specimen: Nasal Mucosa; Nasal Swab  Result Value Ref Range Status   MRSA by PCR Next Gen NOT DETECTED NOT DETECTED Final    Comment: (NOTE) The GeneXpert MRSA Assay (FDA approved for NASAL specimens only), is one component of a comprehensive MRSA colonization surveillance program. It is not intended to diagnose MRSA infection nor to guide or monitor treatment for MRSA infections. Test performance is not FDA approved in patients less than 75 years old. Performed at [facility], [address], [place], [place] [address]     Today   Subjective    Tyriek Hofman today has no headache, no chest abdominal pain, no new weakness tingling or numbness, feels much better wants to go home today.    Objective   Blood pressure 105/61, pulse 81,  temperature 98.2 F (36.8 C), temperature source Oral, resp. rate 16, height 5' 8.5" (1.74 m), weight 77.4 kg, SpO2 100 %.   Intake/Output Summary (Last 24 hours) at 09/18/2022 0821 Last data filed at 09/18/2022 0622 Gross per 24 hour  Intake --  Output 1000 ml  Net -1000 ml    Exam  Awake Alert, No new F.N deficits,    [place].AT,PERRAL Supple Neck,   Symmetrical Chest wall movement, Good air movement bilaterally, CTAB RRR,No Gallops,   +ve B.Sounds, Abd Soft, Non tender,  No Cyanosis, left knee is mildly swollen warm and tender  to palpate.   Data Review   Recent Labs  Lab 09/16/22 1712 09/17/22 0035 09/18/22 0413  WBC 14.9* 12.4* 11.1*  HGB 12.2* 10.5* 11.0*  HCT 36.1* 31.4* 32.4*  PLT 197 143* 134*  MCV 96.8 96.9 95.3  MCH 32.7 32.4 32.4  MCHC 33.8 33.4 34.0  RDW 13.6 13.7 13.9  LYMPHSABS 0.2* 0.4* 0.3*  MONOABS 0.4 0.0* 0.6  EOSABS 0.0 0.0 0.0  BASOSABS 0.0 0.0 0.0    Recent Labs  Lab 09/16/22 1712 09/17/22 0035 09/17/22 0206 09/17/22 1501 09/17/22 1505 09/18/22 0413 09/18/22 0615  NA 138 135  --   --   --  132*  --   K 4.2 4.3  --   --   --  4.0  --   CL 108 106  --   --   --  107  --   CO2 21* 19*  --   --   --  21*  --   ANIONGAP 9 10  --   --   --  4*  --   GLUCOSE 99 107*  --   --   --  141*  --   BUN 17 19  --   --   --  23  --   CREATININE 1.66* 1.49*  --   --   --  1.35*  --   AST 26 34  --   --   --   --   --   ALT 17 21  --   --   --   --   --   ALKPHOS 42 35*  --   --   --   --   --   BILITOT 1.1 0.9  --   --   --   --   --   ALBUMIN 3.5 2.9*  --   --   --   --   --   CRP  --   --   --   --   --   --  31.4*  PROCALCITON  --   --   --   --  <0.10  --  3.78  INR  --   --  1.2  --   --   --   --   TSH  --   --   --  0.708  --   --   --   MG  --  1.8  --   --   --   --   --   CALCIUM 9.0 8.3*  --   --   --  8.2*  --     Total Time in preparing paper work, data evaluation and todays exam - 35 minutes  Signature  -    Susa Raring M.D on 09/18/2022 at 8:21 AM   -  To page go to [email or web address]

## 2022-09-18 NOTE — Progress Notes (Addendum)
Physical Therapy Treatment Patient Details Name: Charles Norton MRN: [id number] DOB: 08/19/1940 Today's Date: 09/18/2022   History of Present Illness Patient is 82 y.o. male who admitted to [facility] on 09/16/2022 with AKI after presenting from home to ED complaining of generalized weakness and Lt knee pain and swelling. PMH significant for Lt TKA in 2018, BPH, HTN, HLD,    PT Comments    Pt tolerated today's session well, able to progress ambulation with RW but continues to be limited by pain in L knee. Pt educated on proper technique for transfers to help decrease pain, pt continuing to need assist returning to sitting for controlled descent. With progression of ambulation, pt able to accept more weight through LLE, allowing him to ambulate further distances. Pt declined attempting stairs, reviewed proper technique and pt seemed familiar, reporting utilizing that technique after TKA. Also educated on utilizing scooters in the community when they are available until ortho is able to follow up with pt. Acute PT will follow during admission to continue to progress mobility as pain allows. Discharge recommendations remain appropriate to progress mobility and strength until ortho plans are established.     Recommendations for follow up therapy are one component of a multi-disciplinary discharge planning process, led by the attending physician.  Recommendations may be updated based on patient status, additional functional criteria and insurance authorization.  Follow Up Recommendations       Assistance Recommended at Discharge Frequent or constant Supervision/Assistance  Patient can return home with the following A little help with walking and/or transfers;A little help with bathing/dressing/bathroom;Assistance with cooking/housework;Direct supervision/assist for medications management;Assist for transportation;Help with stairs or ramp for entrance   Equipment Recommendations  Rolling walker (2 wheels)     Recommendations for Other Services       Precautions / Restrictions Precautions Precautions: Fall Precaution Comments: monitor BP Restrictions Weight Bearing Restrictions: No     Mobility  Bed Mobility Overal bed mobility: Needs Assistance Bed Mobility: Supine to Sit, Sit to Supine     Supine to sit: Min guard, HOB elevated Sit to supine: Min guard, HOB elevated   General bed mobility comments: close guard with cueing for proper technique. Increased time required to complete due to pain    Transfers Overall transfer level: Needs assistance Equipment used: Rolling walker (2 wheels) Transfers: Sit to/from Stand Sit to Stand: Min guard, Min assist           General transfer comment: minG to stand with cueing on technique but minA required for return to sitting due to decreased eccentric control and posterior lean. Improving with second attempt but assist required    Ambulation/Gait Ambulation/Gait assistance: Min guard Gait Distance (Feet): 80 Feet Assistive device: Rolling walker (2 wheels) Gait Pattern/deviations: Decreased weight shift to left, Step-to pattern, Antalgic, Trunk flexed Gait velocity: decreased     General Gait Details: initially not weight bearing on LLE progressing to taking some weight through LLE with progression but with antalgic gait pattern   Stairs             Wheelchair Mobility    Modified Rankin (Stroke Patients Only)       Balance Overall balance assessment: Needs assistance Sitting-balance support: Feet supported, Bilateral upper extremity supported Sitting balance-Leahy Scale: Fair     Standing balance support: Bilateral upper extremity supported, During functional activity, Reliant on assistive device for balance Standing balance-Leahy Scale: Poor  Cognition Arousal/Alertness: Awake/alert Behavior During Therapy: WFL for tasks assessed/performed Overall Cognitive Status:  Within Functional Limits for tasks assessed                                          Exercises      General Comments        Pertinent Vitals/Pain Pain Assessment Pain Assessment: Faces Faces Pain Scale: Hurts little more Pain Location: L knee Pain Descriptors / Indicators: Aching, Discomfort Pain Intervention(s): Limited activity within patient's tolerance, Monitored during session, Repositioned    Home Living Family/patient expects to be discharged to:: Private residence Living Arrangements: Spouse/significant other Available Help at Discharge: Family Type of Home: House Home Access: Stairs to enter Entrance Stairs-Rails: Right Entrance Stairs-Number of Steps: 3   Home Layout: One level        Prior Function            PT Goals (current goals can now be found in the care plan section) Acute Rehab PT Goals Patient Stated Goal: regain independence PT Goal Formulation: With patient Time For Goal Achievement: 10/01/22 Potential to Achieve Goals: Good Progress towards PT goals: Progressing toward goals    Frequency    Min 4X/week      PT Plan Current plan remains appropriate    Co-evaluation              AM-PAC PT "6 Clicks" Mobility   Outcome Measure  Help needed turning from your back to your side while in a flat bed without using bedrails?: A Little Help needed moving from lying on your back to sitting on the side of a flat bed without using bedrails?: A Little Help needed moving to and from a bed to a chair (including a wheelchair)?: A Little Help needed standing up from a chair using your arms (e.g., wheelchair or bedside chair)?: A Little Help needed to walk in hospital room?: A Little Help needed climbing 3-5 steps with a railing? : A Lot 6 Click Score: 17    End of Session Equipment Utilized During Treatment: Gait belt Activity Tolerance: Patient tolerated treatment well;Patient limited by pain Patient left: in bed;with  call bell/phone within reach Nurse Communication: Mobility status PT Visit Diagnosis: Unsteadiness on feet (R26.81);Other abnormalities of gait and mobility (R26.89);Muscle weakness (generalized) (M62.81);Difficulty in walking, not elsewhere classified (R26.2)     Time: 1324-4010 PT Time Calculation (min) (ACUTE ONLY): 26 min  Charges:  $Gait Training: 8-22 mins $Therapeutic Activity: 8-22 mins                     Lindalou Hose, PT DPT Acute Rehabilitation Services Office [phone]    Leonie Man 09/18/2022, 12:57 PM

## 2022-09-18 NOTE — Discharge Instructions (Addendum)
  Follow with Primary MD Merri Brunette, MD in 1-2 days   Get CBC, CMP, ER, CRP -  checked next visit with your primary MD   Activity: L.Leg weight bearing as tolerated with full fall precautions use walker/cane & assistance as needed  Disposition Home    Diet: Heart Healthy   Special Instructions: If you have smoked or chewed Tobacco  in the last 2 yrs please stop smoking, stop any regular Alcohol  and or any Recreational drug use.  On your next visit with your primary care physician please Get Medicines reviewed and adjusted.  Please request your Prim.MD to go over all Hospital Tests and Procedure/Radiological results at the follow up, please get all Hospital records sent to your Prim MD by signing hospital release before you go home.  If you experience worsening of your admission symptoms, develop shortness of breath, life threatening emergency, suicidal or homicidal thoughts you must seek medical attention immediately by calling 911 or calling your MD immediately  if symptoms less severe.  You Must read complete instructions/literature along with all the possible adverse reactions/side effects for all the Medicines you take and that have been prescribed to you. Take any new Medicines after you have completely understood and accpet all the possible adverse reactions/side effects.

## 2022-09-18 NOTE — Evaluation (Signed)
Occupational Therapy Evaluation Patient Details Name: Charles Norton MRN: [id number] DOB: 1940-06-12 Today's Date: 09/18/2022   History of Present Illness Patient is 82 y.o. male who admitted to [facility] on 09/16/2022 with AKI after presenting from home to ED complaining of generalized weakness and Lt knee pain and swelling. PMH significant for Lt TKA in 2018, BPH, HTN, HLD,   Clinical Impression   Pt supine in bed with HOB elevated upon OT arrival. Pt agreeable to skilled OT eval. At baseline PLOF, pt is Independent with ADLs and IADLs and drives. Pt previously performed functional transfers and functional mobility Independently without an AD. Pt now presents with a decline in safety and independence with ADLs and decline in functional transfers and mobility. Pt currently performing UB ADLs Independent to Set up in sitting and requiring Min assist for LB ADLs. Pt currently requires Min guard to Min assist for functional transfers and functional mobility with RW (2 wheel). Pt will benefit from acute skilled OT services to increase safety and independence with ADLs, functional transfers, and functional mobility. Post acute discharge, pt will benefit from continued skilled OT services in the home.      Recommendations for follow up therapy are one component of a multi-disciplinary discharge planning process, led by the attending physician.  Recommendations may be updated based on patient status, additional functional criteria and insurance authorization.   Assistance Recommended at Discharge Intermittent Supervision/Assistance  Patient can return home with the following A little help with walking and/or transfers;A little help with bathing/dressing/bathroom;Assistance with cooking/housework;Assist for transportation;Help with stairs or ramp for entrance    Functional Status Assessment  Patient has had a recent decline in their functional status and demonstrates the ability to make significant improvements  in function in a reasonable and predictable amount of time.  Equipment Recommendations  Tub/shower seat;Toilet rise with handles;Other (comment) (RW (2 wheel))    Recommendations for Other Services       Precautions / Restrictions Precautions Precautions: Fall Precaution Comments: monitor BP Restrictions Weight Bearing Restrictions: No      Mobility Bed Mobility Overal bed mobility: Needs Assistance Bed Mobility: Supine to Sit, Sit to Supine     Supine to sit: Min guard Sit to supine: Min assist (Min assist to lift L LE into bed with cues for technique to use R LE to assist L LE)        Transfers Overall transfer level: Needs assistance Equipment used: Rolling walker (2 wheels) Transfers: Sit to/from Stand, Bed to chair/wheelchair/BSC Sit to Stand: Min assist     Step pivot transfers: Min guard (with use of RW (2 wheel))     General transfer comment: Min guard sit/stand to/from bed and Min assist with cues for technique for sit/stand from comfort height toilet using grab bars      Balance Overall balance assessment: Needs assistance Sitting-balance support: No upper extremity supported, Feet supported, Single extremity supported Sitting balance-Leahy Scale: Fair   Postural control: Posterior lean Standing balance support: Bilateral upper extremity supported, Single extremity supported, During functional activity, Reliant on assistive device for balance (Balance affected by L LE pain level in standing) Standing balance-Leahy Scale: Poor                             ADL either performed or assessed with clinical judgement   ADL Overall ADL's : Needs assistance/impaired Eating/Feeding: Independent   Grooming: Wash/dry hands;Wash/dry face;Oral care;Min guard;Standing Grooming Details (indicate cue  type and reason): Set up while sitting Upper Body Bathing: Set up;Sitting   Lower Body Bathing: Minimal assistance;Sitting/lateral leans;Cueing for  compensatory techniques   Upper Body Dressing : Independent;Sitting   Lower Body Dressing: Minimal assistance;Sit to/from stand   Toilet Transfer: Minimal assistance;Ambulation;Comfort height toilet;Grab bars (Cues for technique)   Toileting- Clothing Manipulation and Hygiene: Minimal assistance;Sit to/from stand;Cueing for compensatory techniques       Functional mobility during ADLs: Min guard;Rolling walker (2 wheels)       Vision Baseline Vision/History: 1 Wears glasses (Wears readers) Ability to See in Adequate Light: 0 Adequate Patient Visual Report: No change from baseline Vision Assessment?: No apparent visual deficits     Perception     Praxis Praxis Praxis tested?: Within functional limits    Pertinent Vitals/Pain Pain Assessment Pain Assessment: Faces Faces Pain Scale: Hurts little more Pain Location: L knee Pain Descriptors / Indicators: Aching, Discomfort, Pressure, Grimacing, Guarding Pain Intervention(s): Limited activity within patient's tolerance, Monitored during session, Repositioned     Hand Dominance Right   Extremity/Trunk Assessment Upper Extremity Assessment Upper Extremity Assessment: Overall WFL for tasks assessed   Lower Extremity Assessment Lower Extremity Assessment: Defer to PT evaluation   Cervical / Trunk Assessment Cervical / Trunk Assessment: Normal   Communication Communication Communication: No difficulties   Cognition Arousal/Alertness: Awake/alert Behavior During Therapy: WFL for tasks assessed/performed Overall Cognitive Status: Within Functional Limits for tasks assessed                                 General Comments: Pt AAO x4 with good safety awareness.     General Comments  VSS on room air    Exercises     Shoulder Instructions      Home Living Family/patient expects to be discharged to:: Private residence Living Arrangements: Spouse/significant other Available Help at Discharge:  Family Type of Home: House Home Access: Stairs to enter Secretary/administrator of Steps: 3 Entrance Stairs-Rails: Right Home Layout: One level     Bathroom Shower/Tub: Producer, television/film/video: Standard Bathroom Accessibility: Yes How Accessible: Accessible via walker Home Equipment: None (Pt reported none on this day. This differs from report to PT.)          Prior Functioning/Environment Prior Level of Function : Independent/Modified Independent             Mobility Comments: Pt played tennis the morning PTA, has been mobilizing independently with no AD ADLs Comments: Independent        OT Problem List: Decreased activity tolerance;Impaired balance (sitting and/or standing);Decreased knowledge of use of DME or AE;Pain;Other (comment) (Decreased balance during functional tasks)      OT Treatment/Interventions: Self-care/ADL training;Energy conservation;DME and/or AE instruction;Therapeutic activities;Patient/family education;Balance training;Therapeutic exercise    OT Goals(Current goals can be found in the care plan section) Acute Rehab OT Goals Patient Stated Goal: Pt wasnt to return home with wife and return to PLOF. OT Goal Formulation: With patient Time For Goal Achievement: 10/02/22 Potential to Achieve Goals: Good ADL Goals Pt Will Perform Grooming: with modified independence;standing Pt Will Perform Lower Body Bathing: with modified independence;sit to/from stand Pt Will Perform Lower Body Dressing: with modified independence;sit to/from stand Pt Will Transfer to Toilet: with modified independence (BSC placed over toilet) Pt Will Perform Toileting - Clothing Manipulation and hygiene: with modified independence;sit to/from stand  OT Frequency: Min 2X/week    Co-evaluation  AM-PAC OT "6 Clicks" Daily Activity     Outcome Measure Help from another person eating meals?: None Help from another person taking care of personal  grooming?: A Little (in standing) Help from another person toileting, which includes using toliet, bedpan, or urinal?: A Little Help from another person bathing (including washing, rinsing, drying)?: A Little Help from another person to put on and taking off regular upper body clothing?: None Help from another person to put on and taking off regular lower body clothing?: A Little 6 Click Score: 20   End of Session Equipment Utilized During Treatment: Gait belt;Rolling walker (2 wheels) Nurse Communication: Mobility status  Activity Tolerance: Patient tolerated treatment well Patient left: in bed;with call bell/phone within reach;with bed alarm set  OT Visit Diagnosis: Unsteadiness on feet (R26.81);History of falling (Z91.81);Pain                Time: 4098-1191 OT Time Calculation (min): 43 min Charges:  OT General Charges $OT Visit: 1 Visit OT Evaluation $OT Eval Low Complexity: 1 Low OT Treatments $Self Care/Home Management : 23-37 mins  Gissele Narducci "Orson Eva., OTR/L, MA Acute Rehab [phone]   Lendon Colonel 09/18/2022, 1:10 PM

## 2022-09-18 NOTE — TOC Transition Note (Addendum)
Transition of Care [facility]) - CM/SW Discharge Note   Patient Details  Name: Charles Norton MRN: [id number] Date of Birth: 12-26-1940  Transition of Care [facility]) CM/SW Contact:  Gordy Clement, RN Phone Number: 09/18/2022, 10:31 AM   Clinical Narrative:     Patient DC order in this am- Rolling Walker, shower chair and raised toilet seat have been ordered and will be delivered bedside prior to DC from [facility] (choice) . Patient has also been recommended HH. [facility] ([facility] has accepted and will provide SN,PT and OT  AVS updated Wife to transport      Final next level of care: Home w Home Health Services Barriers to Discharge: Continued Medical Work up   Patient Goals and CMS Choice CMS Medicare.gov Compare Post Acute Care list provided to:: Patient Choice offered to / list presented to : Patient  Discharge Placement                         Discharge Plan and Services Additional resources added to the After Visit Summary for   In-house Referral: NA Discharge Planning Services: CM Consult Post Acute Care Choice: Home Health, Durable Medical Equipment          DME Arranged: Shower stool, Walker rolling, Other see comment (Raised toilet seat) DME Agency: [employer] Date DME Agency Contacted: 09/18/22 Time DME Agency Contacted: 1024 Representative spoke with at DME Agency: Vaughan Basta HH Arranged: PT, OT HH Agency: [facility] Date [facility] Agency Contacted: 09/18/22 Time HH Agency Contacted: 1025 Representative spoke with at [facility] Agency: Tresa Endo  Social Determinants of Health (SDOH) Interventions SDOH Screenings   Tobacco Use: Low Risk  (09/17/2022)     Readmission Risk Interventions     No data to display

## 2022-09-18 NOTE — Procedures (Signed)
Procedure: Left knee aspiration and injection   Indication: Left knee effusion(s)   Surgeon: Charma Igo, PA-C   Assist: None   Anesthesia: Topical refrigeratn   EBL: None   Complications: None   Findings: After risks/benefits explained patient desires to undergo procedure. Consent obtained and time out performed. The left knee was sterilely prepped and aspirated. 22ml purulent fluid obtained. 6ml 0.5% Marcaine instilled. Pt tolerated the procedure well.       Freeman Caldron, PA-C Orthopedic Surgery [phone]

## 2022-09-18 NOTE — TOC Initial Note (Signed)
Transition of Care [facility]) - Initial/Assessment Note    Patient Details  Name: Charles Norton MRN: [id number] Date of Birth: 1940/09/25  Transition of Care [facility]) CM/SW Contact:    Gordy Clement, RN Phone Number: 09/18/2022, 10:26 AM  Clinical Narrative:                 CM met with Patient bedside  Patient presented to unit from ED yesterday late afternoon. He lives at home with Wife . PCP is established.    Expected Discharge Plan: Home w Home Health Services Barriers to Discharge: Continued Medical Work up   Patient Goals and CMS Choice Patient states their goals for this hospitalization and ongoing recovery are:: go home CMS Medicare.gov Compare Post Acute Care list provided to:: Patient Choice offered to / list presented to : Patient      Expected Discharge Plan and Services In-house Referral: NA Discharge Planning Services: CM Consult Post Acute Care Choice: Home Health, Durable Medical Equipment Living arrangements for the past 2 months: Single Family Home Expected Discharge Date: 09/18/22               DME Arranged: Shower stool, Walker rolling, Other see comment (Raised toilet seat) DME Agency: [employer] Date DME Agency Contacted: 09/18/22 Time DME Agency Contacted: 1024 Representative spoke with at DME Agency: Vaughan Basta HH Arranged: PT, OT HH Agency: [facility] Date [facility] Agency Contacted: 09/18/22 Time HH Agency Contacted: 1025 Representative spoke with at [facility] Agency: Tresa Endo  Prior Living Arrangements/Services Living arrangements for the past 2 months: Single Family Home Lives with:: Spouse Patient language and need for interpreter reviewed:: Yes Do you feel safe going back to the place where you live?: Yes      Need for Family Participation in Patient Care: No (Comment) Care giver support system in place?: Yes (comment) (Wife) Current home services:  (NONE) Criminal Activity/Legal Involvement Pertinent to Current Situation/Hospitalization: No  - Comment as needed  Activities of Daily Living Home Assistive Devices/Equipment: Eyeglasses, Environmental consultant (specify type) ADL Screening (condition at time of admission) Patient's cognitive ability adequate to safely complete daily activities?: Yes Is the patient deaf or have difficulty hearing?: No Does the patient have difficulty seeing, even when wearing glasses/contacts?: Yes Does the patient have difficulty concentrating, remembering, or making decisions?: No Patient able to express need for assistance with ADLs?: Yes Does the patient have difficulty dressing or bathing?: No Independently performs ADLs?: Yes (appropriate for developmental age) Does the patient have difficulty walking or climbing stairs?: No Weakness of Legs: Both Weakness of Arms/Hands: None  Permission Sought/Granted Permission sought to share information with : Case Manager Permission granted to share information with : Yes, Verbal Permission Granted     Permission granted to share info w AGENCY: DME/HH        Emotional Assessment Appearance:: Appears stated age Attitude/Demeanor/Rapport: Engaged, Gracious Affect (typically observed): Pleasant Orientation: : Oriented to Self, Oriented to Place, Oriented to  Time, Oriented to Situation Alcohol / Substance Use: Not Applicable Psych Involvement: No (comment)  Admission diagnosis:  AKI (acute kidney injury) [N17.9] Near syncope [R55] Pain and swelling of knee, left [W09.811, M25.462] Patient Active Problem List   Diagnosis Date Noted   AKI (acute kidney injury) 09/17/2022   Generalized weakness 09/17/2022   Dehydration 09/17/2022   Pain and swelling of knee, left 09/17/2022   SIRS (systemic inflammatory response syndrome) 09/17/2022   BPH (benign prostatic hyperplasia) 09/17/2022   Essential hypertension 09/17/2022   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 [place] [address] Phone: [phone] Fax: [phone]  [facility] [address] [place] [address] Phone: [phone] Fax: [phone]     Social Determinants of Health (SDOH) Social History: SDOH Screenings   Tobacco Use: Low Risk  (09/17/2022)   SDOH Interventions:     Readmission Risk Interventions     No data to display

## 2022-09-19 LAB — BODY FLUID CULTURE W GRAM STAIN

## 2022-09-25 NOTE — Patient Instructions (Addendum)
SURGICAL WAITING ROOM VISITATION Patients having surgery or a procedure may have no more than 2 support people in the waiting area - these visitors may rotate in the visitor waiting room.   Due to an increase in RSV and influenza rates and associated hospitalizations, children ages 44 and under may not visit patients in [facility] hospitals. If the patient needs to stay at the hospital during part of their recovery, the visitor guidelines for inpatient rooms apply.  PRE-OP VISITATION  Pre-op nurse will coordinate an appropriate time for 1 support person to accompany the patient in pre-op.  This support person may not rotate.  This visitor will be contacted when the time is appropriate for the visitor to come back in the pre-op area.  Please refer to the [facility] website for the visitor guidelines for Inpatients (after your surgery is over and you are in a regular room).  You are not required to quarantine at this time prior to your surgery. However, you must do this: Hand Hygiene often Do NOT share personal items Notify your provider if you are in close contact with someone who has COVID or you develop fever 100.4 or greater, new onset of sneezing, cough, sore throat, shortness of breath or body aches.  If you test positive for Covid or have been in contact with anyone that has tested positive in the last 10 days please notify you surgeon.    Your procedure is scheduled on:  Tuesday  Sep 30, 2022  Report to [facility] Main Entrance: [facility] entrance where the [employer] is available.   Report to admitting at: 10:15  AM  +++++Call this number if you have any questions or problems the morning of surgery [phone]  Do not eat or drink anything after Midnight the night prior to your surgery/procedure.               FOLLOW  ANY ADDITIONAL PRE OP INSTRUCTIONS YOU RECEIVED FROM YOUR SURGEON'S OFFICE!!!   Oral Hygiene is also important to reduce your risk of infection.         Remember - BRUSH YOUR TEETH THE MORNING OF SURGERY WITH YOUR REGULAR TOOTHPASTE  Do NOT smoke after Midnight the night before surgery.  Take ONLY these medicines the morning of surgery with A SIP OF WATER: none   You may not have any metal on your body including  jewelry, and body piercing  Do not wear lotions, powders, perfumes / cologne, or deodorant  Men may shave face and neck.  Contacts, Hearing Aids, dentures or bridgework may not be worn into surgery. DENTURES WILL BE REMOVED PRIOR TO SURGERY PLEASE DO NOT APPLY "Poly grip" OR ADHESIVES!!!  You may bring a small overnight bag with you on the day of surgery, only pack items that are not valuable. [facility] IS NOT RESPONSIBLE   FOR VALUABLES THAT ARE LOST OR STOLEN.   Do not bring your home medications to the hospital. The Pharmacy will dispense medications listed on your medication list to you during your admission in the Hospital.  Special Instructions: Bring a copy of your healthcare power of attorney and living will documents the day of surgery, if you wish to have them scanned into your [facility] Records- EPIC  Please read over the following fact sheets you were given: IF YOU HAVE QUESTIONS ABOUT YOUR PRE-OP INSTRUCTIONS, PLEASE CALL [phone].   +++++++ PLEASE FOLLOW THE ATTACHED INFORMATION REGARDING SHOWERING / BATHING SCHEDULE  PRIOR TO YOUR SURGERY.  Start this schedule on :  Friday  Sep 26, 2022   ON THE DAY OF SURGERY : Do not apply any lotions/deodorants the morning of surgery.  Please wear clean clothes to the hospital/surgery center.    FAILURE TO FOLLOW THESE INSTRUCTIONS MAY RESULT IN THE CANCELLATION OF YOUR SURGERY  PATIENT SIGNATURE_________________________________  NURSE SIGNATURE__________________________________  ________________________________________________________________________

## 2022-09-25 NOTE — Progress Notes (Addendum)
The patient was identified using 2 approved identifiers. All issues noted in this document were discussed and addressed, Mr Joyal voiced understanding and agreement with all preoperative instructions. The patient was given the surgery instructions when his wife picked up his 5 CHG soap.       COVID Vaccine received:  []  No [x]  Yes Date of any COVID positive Test in last 90 days:  none  PCP - Merri Brunette, MD,  Vonna Drafts  At [facility] Cardiologist - None  Chest x-ray - 09-16-2022  1v  Epic EKG -  09-16-2022  Epic Stress Test - 2018  Epic ECHO -  Cardiac Cath -    PCR screen: [x]  Ordered & Completed   PCR done 09-18-2022           []   No Order but Needs PROFEND           []   N/A for this surgery  Surgery Plan:  []  Ambulatory                            []  Outpatient in bed                            [x]  Admit  Anesthesia:    []  General  [x]  Spinal                           []   Choice []   MAC  Pacemaker / ICD device [x]  No []  Yes   Spinal Cord Stimulator:[x]  No []  Yes       History of Sleep Apnea? [x]  No []  Yes   CPAP used?- [x]  No []  Yes    Does the patient monitor blood sugar?          []  No []  Yes  [x]  N/A  Patient has: [x]  NO Hx DM   []  Pre-DM                 []  DM1  []   DM2  Blood Thinner / Instructions:  none Aspirin Instructions:  none  ERAS Protocol Ordered: []  No  []  Yes PRE-SURGERY []  ENSURE  []  G2  []  No Drink Ordered  Patient is to be NPO after: Midnight prior d/t no orders  Comments: NO ORDERS AS OF 09-26-22 phone interview  Patient was given the 5 CHG shower / bath instructions for THA surgery along with 2 bottles of the CHG soap. Patient will start this on:  09-26-22  Friday.  Patient voiced and wife understanding of this process.     Activity level: Patient is  unable to climb a flight of stairs without difficulty  Patient can perform ADLs without assistance.   Anesthesia review: HTN, Anemia, PONV (Opiates make him sick)  Patient denies shortness  of breath, fever, cough and chest pain at PAT appointment.  Patient verbalized understanding and agreement to the Pre-Surgical Instructions that were given to them at this PAT appointment. Patient was also educated of the need to review these PAT instructions again prior to his surgery.I reviewed the appropriate phone numbers to call if they have any and questions or concerns.

## 2022-09-26 ENCOUNTER — Encounter (HOSPITAL_COMMUNITY)
Admission: RE | Admit: 2022-09-26 | Discharge: 2022-09-26 | Disposition: A | Discharge status: Home / Self Care | Payer: Medicare Other | Source: Ambulatory Visit | Attending: Orthopedic Surgery | Admitting: Orthopedic Surgery

## 2022-09-26 ENCOUNTER — Encounter (HOSPITAL_COMMUNITY): Payer: Self-pay

## 2022-09-26 ENCOUNTER — Other Ambulatory Visit: Payer: Self-pay

## 2022-09-29 NOTE — H&P (Signed)
TOTAL KNEE ANTIBIOTIC SPACER ADMISSION H&P  Patient is being admitted for left total knee arthroplasty resection with placement of antibiotic spacer Subjective:  Chief Complaint: Left knee prosthetic joint infection  HPI: Charles Norton, 82 y.o. male has a history of left total knee arthroplasty by Dr. [name] in 2018. He did very well with his knee since this time, and plays tennis regularly. He was out playing tennis on 09/16/22, and reported pain that began at the end of his match. He went home, and felt dehydrated and weak and was taken to the ER by EMS. He incidentally mentioned his knee pain and the knee was aspirated and sent for culture. This revealed strep G. There was no recent infection, injuries, dental work, Catering manager. We discussed plans for resection with placement of antibiotic spacer.   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   Abnormal EKG 02/26/2017   Hyperlipidemia 02/26/2017   Elevated blood pressure reading in office without diagnosis of hypertension 02/26/2017   Preoperative cardiovascular examination 02/26/2017   HERPES SIMPLEX INFECTION, RECURRENT 08/26/2008   ANEMIA, IRON DEFICIENCY 08/26/2008   DIVERTICULOSIS, COLON, HX OF 08/26/2008   INGUINAL HERNIORRHAPHY, HX OF 07/12/2004   Past Medical History:  Diagnosis Date   Adenomatous colon polyp    Anemia    Arthritis    Basal cell carcinoma    BPH (benign prostatic hyperplasia)    Cataract of left eye    due to trauma in left eye   Diverticulosis    in sigmoid colon and in the descending colon   Hemorrhoids    Hypercholesterolemia     Hypertension    Iron deficiency    anemia   Lichen planus    of tongue   Osteoarthritis    PONV (postoperative nausea and vomiting)    Rectal prolapse    Shingles    right ophthalmic involvement   Tinnitus     Past Surgical History:  Procedure Laterality Date   COLONOSCOPY     2010 and 2020   INGUINAL HERNIA REPAIR Bilateral    KNEE SURGERY Bilateral    arthrosocopy    removal of cyst     sphenoid cyst   intranasal sphenoidectomy   ROTATOR CUFF REPAIR Bilateral    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   Procedure: LEFT TOTAL KNEE ARTHROPLASTY;  Surgeon: Durene Romans, MD;  Location: [facility];  Service: Orthopedics;  Laterality: Left;  70 mins with block    No current facility-administered medications for this encounter.   Current Outpatient Medications  Medication Sig Dispense Refill Last Dose   acetaminophen (TYLENOL) 500 MG tablet Take 1 tablet (500 mg total) by mouth every 6 (six) hours as needed for moderate pain. 20 tablet 0    Ascorbic Acid (VITAMIN C) 1000 MG tablet Take 1,000 mg by mouth daily.      Cholecalciferol (VITAMIN D3) 50 MCG (2000 UT) TABS Take 1 tablet by mouth daily.      doxycycline (VIBRA-TABS) 100 MG tablet Take 1 tablet (100 mg total) by mouth every 12 (twelve) hours. 20 tablet 0    magnesium oxide (MAG-OX) 400 MG tablet Take  400 mg by mouth daily.      Multiple Vitamin (MULTI VITAMIN DAILY PO) Take 1 tablet by mouth daily.       Omega-3 Fatty Acids (FISH OIL) 1000 MG CAPS Take 1 capsule by mouth daily.      ondansetron (ZOFRAN-ODT) 8 MG disintegrating tablet Take 8 mg by mouth every 8 (eight) hours as needed for vomiting or nausea.      Probiotic Product (ALIGN) 4 MG CAPS Take 1 capsule by mouth 2 (two) times daily.      rosuvastatin (CRESTOR) 5 MG tablet Take 5 mg by mouth at bedtime.      Specialty Vitamins Products (ICAPS LUTEIN-ZEAXANTHIN PO) Take 1 tablet by mouth daily.      tamsulosin (FLOMAX) 0.4 MG CAPS capsule Take 0.4 mg by mouth at  bedtime.      traMADol (ULTRAM) 50 MG tablet Take 1 tablet (50 mg total) by mouth every 8 (eight) hours as needed. 20 tablet 0    Turmeric 500 MG CAPS Take 1,000 mg by mouth daily.      losartan (COZAAR) 50 MG tablet Take 50 mg by mouth daily. On Hold for Surgery per patient      Allergies  Allergen Reactions   Other Nausea And Vomiting    All pain medications.  Can tolerate if anti-nausea medication first     Social History   Tobacco Use   Smoking status: Never   Smokeless tobacco: Never  Substance Use Topics   Alcohol use: Yes    Alcohol/week: 12.0 standard drinks of alcohol    Types: 12 Standard drinks or equivalent per week    Comment: 2 drinks per day    Family History  Problem Relation Age of Onset   Tuberculosis Mother    Hypertension Father    Heart disease Father    AAA (abdominal aortic aneurysm) Father    Obesity Brother    Colon cancer Neg Hx    Esophageal cancer Neg Hx    Pancreatic cancer Neg Hx    Stomach cancer Neg Hx    Liver disease Neg Hx       Review of Systems  Constitutional:  Negative for chills and fever.  Respiratory:  Negative for cough and shortness of breath.   Cardiovascular:  Negative for chest pain.  Gastrointestinal:  Negative for nausea and vomiting.  Musculoskeletal:  Positive for arthralgias.      Objective:  Physical Exam Very pleasant 82 year old male seen in the office today. Upon examination he was laying on the exam table with a blanket. He is in no acute distress but is in obvious discomfort associated with pain in his left knee.  Left knee exam: His surgical incision is intact however he does have erythema and warmth as well as a palpable swelling to the left knee He does have some swelling and prominence to the superior lateral aspect of the knee without obvious disruption of the skin. Range of motion not assessed He does have lower extremity edema with mild erythema  Vital signs in last 24 hours:     Labs:  Estimated body mass index is 25.57 kg/m as calculated from the following:   Height as of 09/16/22: 5' 8.5" (1.74 m).   Weight as of 09/18/22: 77.4 kg.  Imaging Review       Assessment/Plan:  Left knee prosthetic joint infection  The patient history, physical examination, clinical judgment of the provider and imaging studies are consistent with prosthetic joint infection of  the left knee(s), previous total knee arthroplasty. Resection total knee arthroplasty is deemed medically necessary. The treatment options including medical management, injection therapy, arthroscopy and revision arthroplasty were discussed at length. The risks and benefits of revision total knee arthroplasty were presented and reviewed. The risks due to aseptic loosening, infection, stiffness, patella tracking problems, thromboembolic complications and other imponderables were discussed. The patient acknowledged the explanation, agreed to proceed with the plan and consent was signed. Patient is being admitted for inpatient treatment for surgery, pain control, PT, OT, prophylactic antibiotics, VTE prophylaxis, progressive ambulation and ADL's and discharge planning.The patient is planning to be discharged  home.  Rosalene Billings, PA-C Orthopedic Surgery [facility] [place] [phone]

## 2022-09-30 ENCOUNTER — Other Ambulatory Visit: Payer: Self-pay

## 2022-09-30 ENCOUNTER — Inpatient Hospital Stay (HOSPITAL_COMMUNITY): Payer: Medicare Other | Admitting: Anesthesiology

## 2022-09-30 ENCOUNTER — Inpatient Hospital Stay (HOSPITAL_COMMUNITY)
Admission: RE | Admit: 2022-09-30 | Discharge: 2022-10-02 | DRG: 468 | Disposition: A | Discharge status: Home Health | Payer: Medicare Other | Attending: Orthopedic Surgery | Admitting: Orthopedic Surgery

## 2022-09-30 ENCOUNTER — Encounter (HOSPITAL_COMMUNITY): Payer: Self-pay | Admitting: Orthopedic Surgery

## 2022-09-30 ENCOUNTER — Encounter (HOSPITAL_COMMUNITY)
Admission: RE | Disposition: A | Discharge status: Home Health | Payer: Self-pay | Source: Home / Self Care | Attending: Orthopedic Surgery

## 2022-09-30 DIAGNOSIS — Y831 Surgical operation with implant of artificial internal device as the cause of abnormal reaction of the patient, or of later complication, without mention of misadventure at the time of the procedure: Secondary | ICD-10-CM | POA: Diagnosis present

## 2022-09-30 DIAGNOSIS — E7800 Pure hypercholesterolemia, unspecified: Secondary | ICD-10-CM | POA: Diagnosis present

## 2022-09-30 DIAGNOSIS — I10 Essential (primary) hypertension: Secondary | ICD-10-CM | POA: Diagnosis present

## 2022-09-30 DIAGNOSIS — Z85828 Personal history of other malignant neoplasm of skin: Secondary | ICD-10-CM

## 2022-09-30 DIAGNOSIS — Z8249 Family history of ischemic heart disease and other diseases of the circulatory system: Secondary | ICD-10-CM

## 2022-09-30 DIAGNOSIS — T8454XA Infection and inflammatory reaction due to internal left knee prosthesis, initial encounter: Secondary | ICD-10-CM | POA: Diagnosis present

## 2022-09-30 DIAGNOSIS — E860 Dehydration: Secondary | ICD-10-CM | POA: Diagnosis present

## 2022-09-30 DIAGNOSIS — Z832 Family history of diseases of the blood and blood-forming organs and certain disorders involving the immune mechanism: Secondary | ICD-10-CM

## 2022-09-30 DIAGNOSIS — Z79899 Other long term (current) drug therapy: Secondary | ICD-10-CM

## 2022-09-30 DIAGNOSIS — N400 Enlarged prostate without lower urinary tract symptoms: Secondary | ICD-10-CM | POA: Diagnosis present

## 2022-09-30 DIAGNOSIS — Z831 Family history of other infectious and parasitic diseases: Secondary | ICD-10-CM | POA: Diagnosis not present

## 2022-09-30 DIAGNOSIS — Z885 Allergy status to narcotic agent status: Secondary | ICD-10-CM | POA: Diagnosis not present

## 2022-09-30 HISTORY — PX: EXCISIONAL TOTAL KNEE ARTHROPLASTY WITH ANTIBIOTIC SPACERS: SHX5827

## 2022-09-30 SURGERY — REMOVAL, TOTAL ARTHROPLASTY HARDWARE, KNEE, WITH ANTIBIOTIC SPACER INSERTION
Anesthesia: Monitor Anesthesia Care | Site: Knee | Laterality: Left

## 2022-09-30 MED ORDER — AMISULPRIDE (ANTIEMETIC) 5 MG/2ML IV SOLN: 10.0000 mg | Freq: Once | INTRAVENOUS | Status: DC | PRN

## 2022-09-30 MED ORDER — MENTHOL 3 MG MT LOZG: 1.0000 | LOZENGE | OROMUCOSAL | Status: DC | PRN

## 2022-09-30 MED ORDER — PHENYLEPHRINE HCL (PRESSORS) 10 MG/ML IV SOLN
INTRAVENOUS | Status: AC
  Filled 2022-09-30: qty 1

## 2022-09-30 MED ORDER — BUPIVACAINE IN DEXTROSE 0.75-8.25 % IT SOLN
INTRATHECAL | Status: DC | PRN
  Administered 2022-09-30: 1.8 mL via INTRATHECAL

## 2022-09-30 MED ORDER — METOCLOPRAMIDE HCL 5 MG/ML IJ SOLN: 5.0000 mg | Freq: Three times a day (TID) | INTRAMUSCULAR | Status: DC | PRN

## 2022-09-30 MED ORDER — ONDANSETRON HCL 4 MG/2ML IJ SOLN: 4.0000 mg | Freq: Four times a day (QID) | INTRAMUSCULAR | Status: DC | PRN

## 2022-09-30 MED ORDER — METHOCARBAMOL 1000 MG/10ML IJ SOLN: 500.0000 mg | Freq: Four times a day (QID) | INTRAVENOUS | Status: DC | PRN

## 2022-09-30 MED ORDER — PROPOFOL 1000 MG/100ML IV EMUL
INTRAVENOUS | Status: AC
  Filled 2022-09-30: qty 100

## 2022-09-30 MED ORDER — PENICILLIN G POTASSIUM 20000000 UNITS IJ SOLR
9.0000 10*6.[IU] | Freq: Two times a day (BID) | INTRAVENOUS | Status: DC
  Administered 2022-09-30 – 2022-10-02 (×4): 9 10*6.[IU] via INTRAVENOUS
  Filled 2022-09-30 (×5): qty 9

## 2022-09-30 MED ORDER — TAMSULOSIN HCL 0.4 MG PO CAPS
0.4000 mg | ORAL_CAPSULE | Freq: Every day | ORAL | Status: DC
  Administered 2022-09-30 – 2022-10-01 (×2): 0.4 mg via ORAL
  Filled 2022-09-30 (×2): qty 1

## 2022-09-30 MED ORDER — ROPIVACAINE HCL 5 MG/ML IJ SOLN
INTRAMUSCULAR | Status: DC | PRN
  Administered 2022-09-30: 20 mL via PERINEURAL

## 2022-09-30 MED ORDER — PHENOL 1.4 % MT LIQD: 1.0000 | OROMUCOSAL | Status: DC | PRN

## 2022-09-30 MED ORDER — PROPOFOL 500 MG/50ML IV EMUL
INTRAVENOUS | Status: AC
  Filled 2022-09-30: qty 50

## 2022-09-30 MED ORDER — POVIDONE-IODINE 10 % EX SWAB: 2.0000 | Freq: Once | CUTANEOUS | Status: DC

## 2022-09-30 MED ORDER — VANCOMYCIN HCL 1000 MG IV SOLR
INTRAVENOUS | Status: AC
  Filled 2022-09-30: qty 60

## 2022-09-30 MED ORDER — TOBRAMYCIN SULFATE 1.2 G IJ SOLR
INTRAMUSCULAR | Status: DC | PRN
  Administered 2022-09-30: 9.6 g

## 2022-09-30 MED ORDER — TRANEXAMIC ACID-NACL 1000-0.7 MG/100ML-% IV SOLN
1000.0000 mg | Freq: Once | INTRAVENOUS | Status: AC
  Administered 2022-09-30: 1000 mg via INTRAVENOUS
  Filled 2022-09-30: qty 100

## 2022-09-30 MED ORDER — OXYCODONE HCL 5 MG PO TABS
ORAL_TABLET | ORAL | Status: AC
  Filled 2022-09-30: qty 1

## 2022-09-30 MED ORDER — ACETAMINOPHEN 500 MG PO TABS
1000.0000 mg | ORAL_TABLET | Freq: Once | ORAL | Status: AC
  Administered 2022-09-30: 1000 mg via ORAL
  Filled 2022-09-30: qty 2

## 2022-09-30 MED ORDER — OXYCODONE HCL 5 MG PO TABS
5.0000 mg | ORAL_TABLET | Freq: Once | ORAL | Status: AC | PRN
  Administered 2022-09-30: 5 mg via ORAL

## 2022-09-30 MED ORDER — FENTANYL CITRATE PF 50 MCG/ML IJ SOSY
100.0000 ug | PREFILLED_SYRINGE | Freq: Once | INTRAMUSCULAR | Status: AC
  Administered 2022-09-30: 50 ug via INTRAVENOUS
  Filled 2022-09-30: qty 2

## 2022-09-30 MED ORDER — VANCOMYCIN HCL 1000 MG IV SOLR
INTRAVENOUS | Status: AC
  Filled 2022-09-30: qty 140

## 2022-09-30 MED ORDER — OXYCODONE HCL 5 MG PO TABS: 10.0000 mg | ORAL_TABLET | ORAL | Status: DC | PRN

## 2022-09-30 MED ORDER — ONDANSETRON HCL 4 MG PO TABS
4.0000 mg | ORAL_TABLET | Freq: Four times a day (QID) | ORAL | Status: DC | PRN
  Administered 2022-09-30 – 2022-10-01 (×3): 4 mg via ORAL
  Filled 2022-09-30 (×3): qty 1

## 2022-09-30 MED ORDER — TOBRAMYCIN SULFATE 1.2 G IJ SOLR
INTRAMUSCULAR | Status: AC
  Filled 2022-09-30: qty 3.6

## 2022-09-30 MED ORDER — ACETAMINOPHEN 500 MG PO TABS
1000.0000 mg | ORAL_TABLET | Freq: Four times a day (QID) | ORAL | Status: AC
  Administered 2022-09-30 – 2022-10-01 (×4): 1000 mg via ORAL
  Filled 2022-09-30 (×4): qty 2

## 2022-09-30 MED ORDER — CHLORHEXIDINE GLUCONATE 0.12 % MT SOLN
15.0000 mL | Freq: Once | OROMUCOSAL | Status: AC
  Administered 2022-09-30: 15 mL via OROMUCOSAL

## 2022-09-30 MED ORDER — POLYETHYLENE GLYCOL 3350 17 G PO PACK
17.0000 g | PACK | Freq: Two times a day (BID) | ORAL | Status: DC
  Administered 2022-10-01 – 2022-10-02 (×3): 17 g via ORAL
  Filled 2022-09-30 (×3): qty 1

## 2022-09-30 MED ORDER — ONDANSETRON HCL 4 MG/2ML IJ SOLN
INTRAMUSCULAR | Status: AC
  Filled 2022-09-30: qty 2

## 2022-09-30 MED ORDER — METHOCARBAMOL 500 MG PO TABS
500.0000 mg | ORAL_TABLET | Freq: Four times a day (QID) | ORAL | Status: DC | PRN
  Administered 2022-10-01 (×2): 500 mg via ORAL
  Filled 2022-09-30 (×2): qty 1

## 2022-09-30 MED ORDER — TOBRAMYCIN SULFATE 1.2 G IJ SOLR
INTRAMUSCULAR | Status: AC
  Filled 2022-09-30: qty 8.4

## 2022-09-30 MED ORDER — OXYCODONE HCL 5 MG PO TABS
5.0000 mg | ORAL_TABLET | ORAL | Status: DC | PRN
  Administered 2022-09-30 – 2022-10-01 (×4): 5 mg via ORAL
  Administered 2022-10-01: 10 mg via ORAL
  Administered 2022-10-01 – 2022-10-02 (×4): 5 mg via ORAL
  Filled 2022-09-30 (×5): qty 1
  Filled 2022-09-30: qty 2
  Filled 2022-09-30 (×3): qty 1

## 2022-09-30 MED ORDER — CEFAZOLIN SODIUM-DEXTROSE 2-4 GM/100ML-% IV SOLN
2.0000 g | INTRAVENOUS | Status: AC
  Administered 2022-09-30: 2 g via INTRAVENOUS
  Filled 2022-09-30: qty 100

## 2022-09-30 MED ORDER — VANCOMYCIN HCL 1000 MG IV SOLR
INTRAVENOUS | Status: DC | PRN
  Administered 2022-09-30: 8 g

## 2022-09-30 MED ORDER — ASPIRIN 81 MG PO CHEW
81.0000 mg | CHEWABLE_TABLET | Freq: Two times a day (BID) | ORAL | Status: DC
  Administered 2022-09-30 – 2022-10-02 (×4): 81 mg via ORAL
  Filled 2022-09-30 (×4): qty 1

## 2022-09-30 MED ORDER — SODIUM CHLORIDE 0.9 % IV SOLN: INTRAVENOUS | Status: DC

## 2022-09-30 MED ORDER — MIDAZOLAM HCL 2 MG/2ML IJ SOLN: 2.0000 mg | Freq: Once | INTRAMUSCULAR | Status: DC

## 2022-09-30 MED ORDER — DEXAMETHASONE SODIUM PHOSPHATE 10 MG/ML IJ SOLN: 10.0000 mg | Freq: Once | INTRAMUSCULAR | Status: DC

## 2022-09-30 MED ORDER — ROSUVASTATIN CALCIUM 5 MG PO TABS
5.0000 mg | ORAL_TABLET | Freq: Every day | ORAL | Status: DC
  Administered 2022-09-30 – 2022-10-01 (×2): 5 mg via ORAL
  Filled 2022-09-30 (×2): qty 1

## 2022-09-30 MED ORDER — ACETAMINOPHEN 325 MG PO TABS
325.0000 mg | ORAL_TABLET | Freq: Four times a day (QID) | ORAL | Status: DC | PRN
  Administered 2022-10-02: 650 mg via ORAL
  Filled 2022-09-30: qty 2

## 2022-09-30 MED ORDER — TRANEXAMIC ACID-NACL 1000-0.7 MG/100ML-% IV SOLN
1000.0000 mg | INTRAVENOUS | Status: AC
  Administered 2022-09-30: 1000 mg via INTRAVENOUS
  Filled 2022-09-30: qty 100

## 2022-09-30 MED ORDER — PHENYLEPHRINE HCL-NACL 20-0.9 MG/250ML-% IV SOLN
INTRAVENOUS | Status: DC | PRN
  Administered 2022-09-30: 50 ug/min via INTRAVENOUS

## 2022-09-30 MED ORDER — ONDANSETRON HCL 4 MG/2ML IJ SOLN
INTRAMUSCULAR | Status: DC | PRN
  Administered 2022-09-30: 4 mg via INTRAVENOUS

## 2022-09-30 MED ORDER — METOCLOPRAMIDE HCL 5 MG PO TABS: 5.0000 mg | ORAL_TABLET | Freq: Three times a day (TID) | ORAL | Status: DC | PRN

## 2022-09-30 MED ORDER — DIPHENHYDRAMINE HCL 12.5 MG/5ML PO ELIX: 12.5000 mg | ORAL_SOLUTION | ORAL | Status: DC | PRN

## 2022-09-30 MED ORDER — HYDROMORPHONE HCL 1 MG/ML IJ SOLN
0.5000 mg | INTRAMUSCULAR | Status: DC | PRN
  Administered 2022-10-01: 0.5 mg via INTRAVENOUS
  Filled 2022-09-30: qty 1

## 2022-09-30 MED ORDER — DOCUSATE SODIUM 100 MG PO CAPS
100.0000 mg | ORAL_CAPSULE | Freq: Two times a day (BID) | ORAL | Status: DC
  Administered 2022-09-30 – 2022-10-02 (×4): 100 mg via ORAL
  Filled 2022-09-30 (×4): qty 1

## 2022-09-30 MED ORDER — 0.9 % SODIUM CHLORIDE (POUR BTL) OPTIME
TOPICAL | Status: DC | PRN
  Administered 2022-09-30: 1000 mL

## 2022-09-30 MED ORDER — CEFAZOLIN SODIUM-DEXTROSE 2-4 GM/100ML-% IV SOLN
2.0000 g | Freq: Four times a day (QID) | INTRAVENOUS | Status: AC
  Administered 2022-09-30 – 2022-10-01 (×2): 2 g via INTRAVENOUS
  Filled 2022-09-30 (×2): qty 100

## 2022-09-30 MED ORDER — SODIUM CHLORIDE 0.9 % IR SOLN
Status: DC | PRN
  Administered 2022-09-30: 3000 mL

## 2022-09-30 MED ORDER — LOSARTAN POTASSIUM 50 MG PO TABS
50.0000 mg | ORAL_TABLET | Freq: Every day | ORAL | Status: DC
  Administered 2022-10-01 – 2022-10-02 (×2): 50 mg via ORAL
  Filled 2022-09-30 (×2): qty 1

## 2022-09-30 MED ORDER — BISACODYL 10 MG RE SUPP: 10.0000 mg | Freq: Every day | RECTAL | Status: DC | PRN

## 2022-09-30 MED ORDER — DEXAMETHASONE SODIUM PHOSPHATE 10 MG/ML IJ SOLN
INTRAMUSCULAR | Status: AC
  Filled 2022-09-30: qty 1

## 2022-09-30 MED ORDER — LACTATED RINGERS IV SOLN: INTRAVENOUS | Status: DC

## 2022-09-30 MED ORDER — FENTANYL CITRATE PF 50 MCG/ML IJ SOSY: 25.0000 ug | PREFILLED_SYRINGE | INTRAMUSCULAR | Status: DC | PRN

## 2022-09-30 MED ORDER — ORAL CARE MOUTH RINSE: 15.0000 mL | Freq: Once | OROMUCOSAL | Status: AC

## 2022-09-30 MED ORDER — PROPOFOL 500 MG/50ML IV EMUL
INTRAVENOUS | Status: DC | PRN
  Administered 2022-09-30: 100 ug/kg/min via INTRAVENOUS

## 2022-09-30 MED ORDER — DEXAMETHASONE SODIUM PHOSPHATE 10 MG/ML IJ SOLN
8.0000 mg | Freq: Once | INTRAMUSCULAR | Status: AC
  Administered 2022-09-30: 8 mg via INTRAVENOUS

## 2022-09-30 MED ORDER — OXYCODONE HCL 5 MG/5ML PO SOLN: 5.0000 mg | Freq: Once | ORAL | Status: AC | PRN

## 2022-09-30 SURGICAL SUPPLY — 52 items
ADH SKN CLS APL DERMABOND .7 (GAUZE/BANDAGES/DRESSINGS) ×1
BAG SPEC THK2 15X12 ZIP CLS (MISCELLANEOUS) ×1
BAG ZIPLOCK 12X15 (MISCELLANEOUS) ×1 IMPLANT
BLADE SAW SGTL 13.0X1.19X90.0M (BLADE) ×1 IMPLANT
BLADE SAW SGTL 81X20 HD (BLADE) ×1 IMPLANT
BNDG CMPR 5X62 HK CLSR LF (GAUZE/BANDAGES/DRESSINGS) ×1
BNDG CMPR 6"X 5 YARDS HK CLSR (GAUZE/BANDAGES/DRESSINGS) ×1
BNDG ELASTIC 6INX 5YD STR LF (GAUZE/BANDAGES/DRESSINGS) ×1 IMPLANT
BOWL SMART MIX CTS (DISPOSABLE) ×1 IMPLANT
BRUSH FEMORAL CANAL (MISCELLANEOUS) ×1 IMPLANT
CEMENT BONE R 1X40 (Cement) IMPLANT
COVER SURGICAL LIGHT HANDLE (MISCELLANEOUS) ×1 IMPLANT
CUFF TOURN SGL QUICK 34 (TOURNIQUET CUFF) ×1
CUFF TRNQT CYL 34X4.125X (TOURNIQUET CUFF) ×1 IMPLANT
DERMABOND ADVANCED .7 DNX12 (GAUZE/BANDAGES/DRESSINGS) ×1 IMPLANT
DRAPE INCISE IOBAN 66X45 STRL (DRAPES) ×3 IMPLANT
DRAPE U-SHAPE 47X51 STRL (DRAPES) ×1 IMPLANT
DRESSING AQUACEL AG SP 3.5X10 (GAUZE/BANDAGES/DRESSINGS) ×1 IMPLANT
DRSG AQUACEL AG 3.5X4 (GAUZE/BANDAGES/DRESSINGS) IMPLANT
DRSG AQUACEL AG ADV 3.5X14 (GAUZE/BANDAGES/DRESSINGS) IMPLANT
DRSG AQUACEL AG SP 3.5X10 (GAUZE/BANDAGES/DRESSINGS) ×1
DURAPREP 26ML APPLICATOR (WOUND CARE) ×2 IMPLANT
ELECT REM PT RETURN 15FT ADLT (MISCELLANEOUS) ×1 IMPLANT
FEMORAL 53 AP 75 KNEE LRG (Miscellaneous) IMPLANT
GLOVE BIO SURGEON STRL SZ 6 (GLOVE) ×1 IMPLANT
GLOVE BIOGEL PI IND STRL 6.5 (GLOVE) ×1 IMPLANT
GLOVE BIOGEL PI IND STRL 7.5 (GLOVE) ×1 IMPLANT
GLOVE ORTHO TXT STRL SZ7.5 (GLOVE) ×2 IMPLANT
GOWN STRL REUS W/ TWL LRG LVL3 (GOWN DISPOSABLE) ×3 IMPLANT
GOWN STRL REUS W/TWL LRG LVL3 (GOWN DISPOSABLE) ×3
HANDPIECE INTERPULSE COAX TIP (DISPOSABLE) ×1
JET LAVAGE IRRISEPT WOUND (IRRIGATION / IRRIGATOR) ×1
KIT TURNOVER KIT A (KITS) IMPLANT
LAVAGE JET IRRISEPT WOUND (IRRIGATION / IRRIGATOR) ×1 IMPLANT
MANIFOLD NEPTUNE II (INSTRUMENTS) ×1 IMPLANT
NS IRRIG 1000ML POUR BTL (IV SOLUTION) ×1 IMPLANT
PACK TOTAL KNEE CUSTOM (KITS) ×1 IMPLANT
PROTECTOR NERVE ULNAR (MISCELLANEOUS) ×1 IMPLANT
SET HNDPC FAN SPRY TIP SCT (DISPOSABLE) ×1 IMPLANT
SET PAD KNEE POSITIONER (MISCELLANEOUS) ×1 IMPLANT
SOLUTION IRRIG SURGIPHOR (IV SOLUTION) IMPLANT
SOLUTION PRONTOSAN WOUND 350ML (IRRIGATION / IRRIGATOR) ×1 IMPLANT
SPACER TIB 45 A/P 70 M/L MED (Miscellaneous) IMPLANT
STAPLER VISISTAT 35W (STAPLE) IMPLANT
SUT MNCRL AB 3-0 PS2 18 (SUTURE) ×1 IMPLANT
SUT STRATAFIX PDS+ 0 24IN (SUTURE) ×1 IMPLANT
SUT VIC AB 1 CT1 36 (SUTURE) ×2 IMPLANT
SUT VIC AB 2-0 CT1 27 (SUTURE) ×3
SUT VIC AB 2-0 CT1 TAPERPNT 27 (SUTURE) ×3 IMPLANT
TRAY FOLEY MTR SLVR 16FR STAT (SET/KITS/TRAYS/PACK) ×1 IMPLANT
WATER STERILE IRR 1000ML POUR (IV SOLUTION) ×1 IMPLANT
WRAP KNEE MAXI GEL POST OP (GAUZE/BANDAGES/DRESSINGS) ×1 IMPLANT

## 2022-09-30 NOTE — Transfer of Care (Signed)
Immediate Anesthesia Transfer of Care Note  Patient: Charles Norton  Procedure(s) Performed: EXCISIONAL TOTAL KNEE ARTHROPLASTY WITH ANTIBIOTIC SPACERS (Left: Knee)  Patient Location: PACU  Anesthesia Type:Spinal  Level of Consciousness: sedated  Airway & Oxygen Therapy: Patient Spontanous Breathing and Patient connected to face mask oxygen  Post-op Assessment: Report given to RN and Post -op Vital signs reviewed and stable  Post vital signs: Reviewed and stable  Last Vitals:  Vitals Value Taken Time  BP    Temp    Pulse 64 09/30/22 1501  Resp 16 09/30/22 1501  SpO2 100 % 09/30/22 1501  Vitals shown include unvalidated device data.  Last Pain:  Vitals:   09/30/22 1155  TempSrc:   PainSc: 0-No pain         Complications: No notable events documented.

## 2022-09-30 NOTE — Evaluation (Signed)
Physical Therapy Evaluation Patient Details Name: Charles Norton MRN: [id number] DOB: [birth year] Today's Date: 09/30/2022  History of Present Illness  82 yo male presents to therapy s/p excisional L TKA with ABX spacer on 09/30/2022 due to L knee pain and was found to have strep infection on 4/23. Pt presented [facility] ED via EMS on 4/23 secondary to weakness and dehydration with dx of AKI and L knee pain. L knee aspiration revealed strep G. Pt is currently LLE 50% WB with KI donned. Pt has PMH including but not limited to: AKI, SIRS, HTN, L TKA (2018), and B RTC repair.  Clinical Impression    Charles Norton is a 82 y.o. male POD 0 s/p excisional L TKA with ABX spacer. Patient reports IND with mobility at baseline. Patient is now limited by functional impairments (see PT problem list below) and requires min A  for bed mobility and min guard and cues for transfers. Patient was able to ambulate 35 feet with RW and min guard level of assist with cues for use of B UE support with LLE stance phase to maintain 50% WB status with KI donned.  Patient will benefit from continued skilled PT interventions to address impairments and progress towards PLOF. Acute PT will follow to progress mobility and stair training in preparation for safe discharge home with family support.      Recommendations for follow up therapy are one component of a multi-disciplinary discharge planning process, led by the attending physician.  Recommendations may be updated based on patient status, additional functional criteria and insurance authorization.  Follow Up Recommendations       Assistance Recommended at Discharge Intermittent Supervision/Assistance  Patient can return home with the following  A little help with walking and/or transfers;A little help with bathing/dressing/bathroom;Assistance with cooking/housework;Assist for transportation;Help with stairs or ramp for entrance    Equipment Recommendations None recommended by PT  (pt reports DME in home setting)  Recommendations for Other Services       Functional Status Assessment Patient has had a recent decline in their functional status and demonstrates the ability to make significant improvements in function in a reasonable and predictable amount of time.     Precautions / Restrictions Precautions Precautions: Fall;Knee Restrictions Weight Bearing Restrictions: Yes LLE Weight Bearing: Partial weight bearing LLE Partial Weight Bearing Percentage or Pounds: 50 Other Position/Activity Restrictions: LLE KI donned      Mobility  Bed Mobility Overal bed mobility: Needs Assistance Bed Mobility: Supine to Sit, Sit to Supine     Supine to sit: Min assist Sit to supine: Min assist   General bed mobility comments: cues    Transfers Overall transfer level: Needs assistance Equipment used: Rolling walker (2 wheels) Transfers: Sit to/from Stand Sit to Stand: Min guard           General transfer comment: cues for proper UE placement and LLE WB restrictions with KI donned, pt able to side step to L to [facility] with min guard and RW    Ambulation/Gait Ambulation/Gait assistance: Min guard Gait Distance (Feet): 35 Feet Assistive device: Rolling walker (2 wheels) Gait Pattern/deviations: Step-to pattern, Trunk flexed Gait velocity: decreased     General Gait Details: reliance on B UE support, cues for LLE 50% WB with good follow through and KI donned  Stairs            Wheelchair Mobility    Modified Rankin (Stroke Patients Only)       Balance Overall  balance assessment: Needs assistance Sitting-balance support: Feet supported, Bilateral upper extremity supported Sitting balance-Leahy Scale: Good     Standing balance support: Bilateral upper extremity supported, During functional activity, Reliant on assistive device for balance Standing balance-Leahy Scale: Poor                               Pertinent Vitals/Pain Pain  Assessment Pain Assessment: 0-10 Pain Score: 4  Pain Location: L knee Pain Descriptors / Indicators: Aching, Discomfort, Constant (smarts) Pain Intervention(s): Limited activity within patient's tolerance, Monitored during session, Premedicated before session, Repositioned, Ice applied    Home Living Family/patient expects to be discharged to:: Private residence Living Arrangements: Spouse/significant other Available Help at Discharge: Family Type of Home: House Home Access: Stairs to enter Entrance Stairs-Rails: Right Entrance Stairs-Number of Steps: 3   Home Layout: One level Home Equipment: None      Prior Function Prior Level of Function : Independent/Modified Independent;Driving             Mobility Comments: IND with all ADLs, self care tasks, IADLs, active and playing tennis       Hand Dominance   Dominant Hand: Right    Extremity/Trunk Assessment        Lower Extremity Assessment Lower Extremity Assessment: LLE deficits/detail LLE Deficits / Details: ankle DF/PF 5/5 LLE: Unable to fully assess due to immobilization LLE Sensation: WNL    Cervical / Trunk Assessment Cervical / Trunk Assessment: Normal  Communication   Communication: No difficulties  Cognition Arousal/Alertness: Awake/alert Behavior During Therapy: WFL for tasks assessed/performed Overall Cognitive Status: Within Functional Limits for tasks assessed                                          General Comments      Exercises     Assessment/Plan    PT Assessment Patient needs continued PT services  PT Problem List Decreased strength;Decreased activity tolerance;Decreased balance;Decreased mobility;Decreased coordination;Pain       PT Treatment Interventions DME instruction;Gait training;Stair training;Functional mobility training;Therapeutic activities;Therapeutic exercise;Balance training;Neuromuscular re-education;Patient/family education;Modalities    PT  Goals (Current goals can be found in the Care Plan section)  Acute Rehab PT Goals Patient Stated Goal: improve IND with functional mobiltiy, get over this and play tennis again PT Goal Formulation: With patient Time For Goal Achievement: 10/14/22 Potential to Achieve Goals: Good    Frequency 7X/week     Co-evaluation               AM-PAC PT "6 Clicks" Mobility  Outcome Measure Help needed turning from your back to your side while in a flat bed without using bedrails?: A Little Help needed moving from lying on your back to sitting on the side of a flat bed without using bedrails?: A Little Help needed moving to and from a bed to a chair (including a wheelchair)?: A Little Help needed standing up from a chair using your arms (e.g., wheelchair or bedside chair)?: A Little Help needed to walk in hospital room?: A Little Help needed climbing 3-5 steps with a railing? : A Lot 6 Click Score: 17    End of Session Equipment Utilized During Treatment: Gait belt Activity Tolerance: Patient tolerated treatment well;Patient limited by fatigue Patient left: in bed;with call bell/phone within reach Nurse Communication: Mobility status PT Visit Diagnosis:  Unsteadiness on feet (R26.81);Other abnormalities of gait and mobility (R26.89);Muscle weakness (generalized) (M62.81);Pain Pain - Right/Left: Left Pain - part of body: Knee;Leg    Time: 1610-9604 PT Time Calculation (min) (ACUTE ONLY): 28 min   Charges:   PT Evaluation $PT Eval Low Complexity: 1 Low PT Treatments $Gait Training: 8-22 mins         Rica Mote, PT   Jacqualyn Posey 09/30/2022, 7:10 PM

## 2022-09-30 NOTE — Discharge Instructions (Signed)

## 2022-09-30 NOTE — Anesthesia Preprocedure Evaluation (Addendum)
Anesthesia Evaluation  Patient identified by MRN, date of birth, ID band Patient awake    Reviewed: Allergy & Precautions, NPO status , Patient's Chart, lab work & pertinent test results  History of Anesthesia Complications (+) PONV and history of anesthetic complications  Airway Mallampati: II  TM Distance: >3 FB Neck ROM: Full    Dental  (+) Dental Advisory Given   Pulmonary neg pulmonary ROS   Pulmonary exam normal breath sounds clear to auscultation       Cardiovascular hypertension (losartan), Pt. on medications (-) angina (-) Past MI, (-) Cardiac Stents and (-) CABG + dysrhythmias (incomplete RBBB)  Rhythm:Regular Rate:Normal  HLD  Stress Test 03/02/2017:   Nuclear stress EF: 62%.  There was no ST segment deviation noted during stress.  Defect 1: There is a medium defect of moderate severity present in the basal inferior and mid inferior location.  The left ventricular ejection fraction is normal (55-65%).   Normal stress nuclear study with diaphragmatic attenuation but no ischemia; EF 62 with normal wall motion.     Neuro/Psych negative neurological ROS     GI/Hepatic Neg liver ROS,neg GERD  ,,diverticulosis   Endo/Other  negative endocrine ROS    Renal/GU negative Renal ROS     Musculoskeletal  (+) Arthritis , Osteoarthritis,    Abdominal   Peds  Hematology  (+) Blood dyscrasia, anemia   Anesthesia Other Findings Platelets 134  Reproductive/Obstetrics                             Anesthesia Physical Anesthesia Plan  ASA: 3  Anesthesia Plan: MAC, Spinal and Regional   Post-op Pain Management: Tylenol PO (pre-op)* and Regional block*   Induction: Intravenous  PONV Risk Score and Plan: 2 and Ondansetron  Airway Management Planned: Natural Airway and Simple Face Mask  Additional Equipment:   Intra-op Plan:   Post-operative Plan:   Informed Consent: I have  reviewed the patients History and Physical, chart, labs and discussed the procedure including the risks, benefits and alternatives for the proposed anesthesia with the patient or authorized representative who has indicated his/her understanding and acceptance.     Dental advisory given  Plan Discussed with: CRNA and Anesthesiologist  Anesthesia Plan Comments: (Discussed potential risks of nerve blocks including, but not limited to, infection, bleeding, nerve damage, seizures, pneumothorax, respiratory depression, and potential failure of the block. Alternatives to nerve blocks discussed. All questions answered.  I have discussed risks of neuraxial anesthesia including but not limited to infection, bleeding, nerve injury, back pain, headache, seizures, and failure of block. Patient denies bleeding disorders and is not currently anticoagulated. Labs have been reviewed. Risks and benefits discussed. All patient's questions answered.   Discussed with patient risks of MAC including, but not limited to, minor pain or discomfort, hearing people in the room, and possible need for backup general anesthesia. Risks for general anesthesia also discussed including, but not limited to, sore throat, hoarse voice, chipped/damaged teeth, injury to vocal cords, nausea and vomiting, allergic reactions, lung infection, heart attack, stroke, and death. All questions answered. )        Anesthesia Quick Evaluation

## 2022-09-30 NOTE — Plan of Care (Signed)
  Problem: Education: Goal: Knowledge of the prescribed therapeutic regimen will improve Outcome: Progressing   Problem: Activity: Goal: Ability to avoid complications of mobility impairment will improve Outcome: Progressing   Problem: Pain Management: Goal: Pain level will decrease with appropriate interventions Outcome: Progressing   Problem: Activity: Goal: Risk for activity intolerance will decrease Outcome: Progressing   Problem: Elimination: Goal: Will not experience complications related to bowel motility Outcome: Progressing   Problem: Pain Managment: Goal: General experience of comfort will improve Outcome: Progressing

## 2022-09-30 NOTE — Anesthesia Postprocedure Evaluation (Signed)
Anesthesia Post Note  Patient: Charles Norton  Procedure(s) Performed: EXCISIONAL TOTAL KNEE ARTHROPLASTY WITH ANTIBIOTIC SPACERS (Left: Knee)     Patient location during evaluation: PACU Anesthesia Type: Regional, Spinal and MAC Level of consciousness: awake Pain management: pain level controlled Vital Signs Assessment: post-procedure vital signs reviewed and stable Respiratory status: spontaneous breathing, respiratory function stable and nonlabored ventilation Cardiovascular status: blood pressure returned to baseline and stable Postop Assessment: no headache, no backache and no apparent nausea or vomiting Anesthetic complications: no   No notable events documented.  Last Vitals:  Vitals:   09/30/22 1600 09/30/22 1629  BP: (!) 154/80 (!) 159/81  Pulse: 62 63  Resp: 10 18  Temp: (!) 36.1 C (!) 36.3 C  SpO2: 100% 100%    Last Pain:  Vitals:   09/30/22 1629  TempSrc: Oral  PainSc:                  Linton Rump

## 2022-09-30 NOTE — Brief Op Note (Signed)
09/30/2022  12:31 PM  PATIENT:  Charles Norton  82 y.o. male  PRE-OPERATIVE DIAGNOSIS:  Infection of left total knee arthroplasty  POST-OPERATIVE DIAGNOSIS:  Infection of left total knee arthroplasty  PROCEDURE:  Procedure(s): EXCISIONAL TOTAL KNEE ARTHROPLASTY WITH ANTIBIOTIC SPACERS (Left)  SURGEON:  Surgeon(s) and Role:    Durene Romans, MD - Primary  PHYSICIAN ASSISTANT: Rosalene Billings, PA-C  ANESTHESIA:   regional and spinal  EBL:  300-400 cc  BLOOD ADMINISTERED:none  DRAINS: none   LOCAL MEDICATIONS USED:  NONE  SPECIMEN:  No Specimen  DISPOSITION OF SPECIMEN:  N/A  COUNTS:  YES  TOURNIQUET:  73 min at 225 mmHg  DICTATION: .Other Dictation: Dictation Number [id number]  PLAN OF CARE: Admit to inpatient   PATIENT DISPOSITION:  PACU - hemodynamically stable.   Delay start of Pharmacological VTE agent (>24hrs) due to surgical blood loss or risk of bleeding: no

## 2022-09-30 NOTE — Anesthesia Procedure Notes (Signed)
Anesthesia Regional Block: Adductor canal block   Pre-Anesthetic Checklist: , timeout performed,  Correct Patient, Correct Site, Correct Laterality,  Correct Procedure, Correct Position, site marked,  Risks and benefits discussed,  Surgical consent,  Pre-op evaluation,  At surgeon's request and post-op pain management  Laterality: Left  Prep: chloraprep       Needles:  Injection technique: Single-shot  Needle Type: Echogenic Stimulator Needle     Needle Length: 9cm  Needle Gauge: 21     Additional Needles:   Procedures:,,,, ultrasound used (permanent image in chart),,    Narrative:  Start time: 09/30/2022 11:45 AM End time: 09/30/2022 11:47 AM Injection made incrementally with aspirations every 5 mL.  Performed by: Personally  Anesthesiologist: Linton Rump, MD  Additional Notes: Discussed risks and benefits of nerve block including, but not limited to, prolonged and/or permanent nerve injury involving sensory and/or motor function. Monitors were applied and a time-out was performed. The nerve and associated structures were visualized under ultrasound guidance. After negative aspiration, local anesthetic was slowly injected around the nerve. There was no evidence of high pressure during the procedure. There were no paresthesias. VSS remained stable and the patient tolerated the procedure well.

## 2022-09-30 NOTE — Progress Notes (Signed)
Orthopedic Tech Progress Note Patient Details:  Charles Norton 1940/06/01 [id number]  Patient ID: [name], male   DOB: 1941/01/15, 82 y.o.   MRN: [id number]  [name] 09/30/2022, 4:47 PM KI applied in OR

## 2022-09-30 NOTE — Op Note (Unsigned)
NAME: Charles Norton, CABALLEROS MEDICAL RECORD NO: [id number] ACCOUNT NO: [id number] DATE OF BIRTH: [birth year] FACILITY: [facility] LOCATION: [facility] PHYSICIAN: Madlyn Frankel. [name], MD  Operative Report   DATE OF PROCEDURE: 09/30/2022  PREOPERATIVE DIAGNOSIS:  Infected left total knee arthroplasty.  POSTOPERATIVE DIAGNOSIS:  Infected left total knee arthroplasty.  PROCEDURE: 1.  Resection of infected left total knee arthroplasty. 2.  Placement of an antibiotic articulating knee spacer utilizing the DePuy KASM knee system with a size large femoral component and a medial tibial tray as noted in the body of the dictation.  SURGEON:  Madlyn Frankel. [name], MD  ASSISTANT:  Rosalene Billings, PA-C.  Note that Ms. Domenic Schwab was present for the entirety of the procedure from preoperative positioning, perioperative management of the operative extremity, general facilitation of the case and primary wound closure.  ANESTHESIA:  Regional plus spinal block.  DRAINS:  None.  COMPLICATIONS:  None.  TOURNIQUET:  Up for a total of 73 minutes at 225 mmHg.  BLOOD LOSS:  About 300 mL.  INDICATIONS FOR PROCEDURE:  The patient is a very pleasant 82 year old male with a history of a left total knee replacement performed in October 2018.  He had been doing very well until a recent presentation with increasing pain and swelling.  He  presented initially to the emergency room where his knee was aspirated.  Cultures were positive for infection and available to view in epic.  He was discharged from the hospital and followed up with me last week.  We had a chance to review the condition  of his knee.  Though we were unable to determine the exact etiology of this infection, nonetheless, he did have infection that need to be managed.  Based on this, we discussed 2-stage treatment, which represents a current gold standard treatment.  We  discussed the duration of treatment with IV antibiotics followed by repeat surgery two to three months  after the resection for placement of a new knee.  The indications were discussed and reviewed.  The risks of recurrent infection and need for future  surgery as well as DVT, postoperative stiffness were all reviewed.  Consent was obtained for the benefit of management of this infection.  DESCRIPTION OF PROCEDURE:  The patient was brought to the operative theater.  Once adequate anesthesia, preoperative antibiotics including Ancef administered as well as tranexamic acid and Decadron, he was positioned supine with a left thigh tourniquet  placed.  The left lower extremity was then prepped and draped in sterile fashion.  A timeout was performed identifying the patient, planned procedure, and extremity.  The leg was exsanguinated, tourniquet elevated to 225 mmHg.  His old incision was  excised and extended proximally and distal for revision purposes.  Soft tissue planes were created.  During the elevation of the skin and the soft tissues on the lateral aspect knee there was about a 1 cm penetration of the skin with a knife.  This was  later closed primarily.  Once the soft tissue planes were created a median arthrotomy was made.  We did encounter purulent fluid.  Both on the extraarticular aspect of the joint laterally where he had some swelling as well as inside the knee.  The first  portion of the case was directed at an extensive medial lateral parapatellar as well as suprapatellar synovectomy and scar debridement.  This allowed for mobilization of the tissue.  He was noted to have a fairly tight infrapatellar region.  There was  about a 5 mm  transverse tear of the patella tendon at the tubercle, but there was no complete disruption and this was protected throughout the case.  Once the knee was exposed I removed the patellar button with a saw blade.  We drilled out the old  polyethylene from the lug holes.  Following this, the patella subluxated laterally and the knee was flexed.  The old polyethylene was  removed.  I then used a thin ACL saw to undermine the bone cement interface on both the tibia and the femoral surfaces.   The components were removed without significant bone loss.  Remaining cement was removed.  We then drilled open the canals and reamed up to 16 mm on both the distal femur and proximal tibia.  Once the bone and soft tissue were all debrided as well as  remaining cement in the proximal tibia and the femur I irrigated the canals of the femur and the tibia using a canal brush irrigator.  We then irrigated the knee with normal saline solution followed by a half of a bottle of the Surgiphor Betadine  solution.  At this point, we measured the gap to be about 15-17.5 mm.  We selected a medium tibial tray and a large femoral component.  These two were opened.  At this point, we mixed cement first for the tibial tray and then cemented dowels.  These were  mixed with a ratio of 1 gram of vancomycin and 1.2 of tobramycin per batch of cement.  For the total of the case we used a total of 7 grams of vancomycin and 8.4 grams of tobramycin.  Once the tibial tray was configured as well as the dowels we then  waited for this to start to harden before we mixed for the femoral side.  The femoral component was then held into position with a dowel placed into the distal femur until the cement fully cured.  Once secured, the mold was removed from the cement and  the cemented bone remained in place.  We then mixed a final batch of cement for placement of the tibial tray.  The knee was then brought to extension to allow the cement to fully cure.  While this was going on, we reirrigated the knee with another 3  liters bag of solution followed by the Surgiphor and Prontosan antimicrobial fluid.  Once this was done, the tourniquet was let down.  There was some expected oozing.  There was no significant hemostasis that was required.  I then reapproximated the  extensor mechanism using #1 Vicryl and #1 Stratafix  suture.  Care was taken to focus on the area of the distal part of the extensor mechanism at the tibial tubercle trying to reapproximate this area to provide as much security to allow for motion.  The  remainder of the wound was closed with 2-0 Vicryl and a running Monocryl stitch.  The knee was then cleaned, dried and dressed sterilely using Dermabond and Aquacel dressing.  The lateral incision where there was penetration of the skin during soft  tissue exposure was closed primarily as well.  The knee was wrapped into a sterile bulky Jones dressing.  He was then brought to the recovery room in stable condition, tolerating the procedure well.  Findings reviewed with his wife.  He will be admitted  to the hospital.  We will need to get a PICC line ordered.  Infectious disease will be consulted with the capability to review cultures that were previously obtained in the emergency  room about 2 weeks ago and available to review in epic.   PUS D: 09/30/2022 2:55:38 pm T: 09/30/2022 4:13:00 pm  JOB: [id number]/ [id number]

## 2022-09-30 NOTE — Consult Note (Incomplete)
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  Can tolerate if anti-nausea medication first     OPAT Orders Discharge antibiotics to be given via PICC line Discharge antibiotics: Penicillin G  Per pharmacy protocol  Duration: 6 weeks  End Date: 6/18  [facility] Care Per Protocol:  Home health RN for IV administration and teaching; PICC line care and labs.    Labs weekly while on IV antibiotics: X__ CBC with differential __ BMP X__ CMP X__ CRP X__ ESR __ Vancomycin trough __ CK  X__ Please pull PIC at completion of IV antibiotics __ Please leave PIC in place until doctor has seen patient or been notified  Fax weekly labs to [phone]  Clinic Follow Up Appt 5/28 at 4 pm myself  Rest of the management as per the  primary team. Please call with questions or concerns.  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  Patient was doing well postoperatively after his left total knee arthroplasty in 2018 and plays tennis regularly.  He was out playing tennis on 09/16/2022 and reported pain that began at the end of the match.  He went home and felt dehydrated and weak and was taken to the ER by EMS on 4/23.  Hospital course was significant for dehydration secondary to AKI.  Discharged on 10 days of doxycycline which he took as instructed. Reports he had a cancerous lesion taken out from his left foot prior to the ED visit which has healed. Seen by orthopedics, his knee was aspirated 4/23 and which grew strep G.  Denies any prior infection, injuries or dental work  5/7 s/p resection of infected left TKA and placement of an antibiotic articulating spacer.  OR note with mention of purulent fluid. No OR cultures sent.   Remains afebrile.  Labs remarkable for CRP 4.7 WBC 12.6, ESR 77  ROS: General- Denies fever, chills, loss of appetite and loss of weight HEENT - Denies headache, blurry vision, neck pain, sinus pain Chest - Denies any chest pain, SOB or cough CVS- Denies any dizziness/lightheadedness, syncopal attacks, palpitations Abdomen- Denies any nausea, vomiting, abdominal pain, hematochezia and diarrhea Neuro - Denies any weakness, numbness, tingling sensation Psych - Denies any changes in mood irritability or depressive symptoms GU- Denies any burning, dysuria, hematuria or increased frequency of urination Skin - denies any rashes/lesions MSK - denies any joint pain/swelling or  restricted ROM   Past Medical History:  Diagnosis Date   Adenomatous colon polyp    Anemia    Arthritis    Basal cell carcinoma    BPH (benign prostatic hyperplasia)    Cataract of left eye    due to trauma in left eye   Diverticulosis    in sigmoid colon and in the descending colon   Hemorrhoids    Hypercholesterolemia    Hypertension    Iron deficiency    anemia   Lichen planus    of tongue   Osteoarthritis    PONV (postoperative nausea and vomiting)    Rectal prolapse    Shingles    right ophthalmic involvement   Tinnitus    Past Surgical History:  Procedure Laterality Date   COLONOSCOPY     2010 and 2020   INGUINAL HERNIA REPAIR Bilateral    KNEE SURGERY Bilateral    arthrosocopy    removal of cyst     sphenoid cyst   intranasal sphenoidectomy   ROTATOR CUFF REPAIR Bilateral    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   Procedure: LEFT TOTAL KNEE ARTHROPLASTY;  Surgeon: Durene Romans, MD;  Location: [facility];  Service: Orthopedics;  Laterality: Left;  70 mins with block     Scheduled Meds:  acetaminophen  1,000 mg Oral Q6H   aspirin  81 mg Oral BID   [START ON 10/01/2022] dexamethasone (DECADRON) injection  10 mg Intravenous Once   docusate sodium  100 mg Oral BID   [START ON 10/01/2022] losartan  50 mg Oral Daily   oxyCODONE       polyethylene glycol  17 g Oral BID   rosuvastatin  5 mg Oral QHS   tamsulosin  0.4 mg Oral QHS   Continuous Infusions:  sodium chloride 75 mL/hr at 09/30/22 1734    ceFAZolin (ANCEF) IV 2 g (09/30/22 1754)   methocarbamol (ROBAXIN) IV     penicillin G potassium 9 Million Units in dextrose 5 % 500 mL CONTINUOUS infusion     PRN Meds:.[START ON 10/01/2022] acetaminophen, bisacodyl, diphenhydrAMINE, HYDROmorphone (DILAUDID) injection, menthol-cetylpyridinium **OR** phenol, methocarbamol **OR** methocarbamol (ROBAXIN) IV, metoCLOPramide **OR** metoCLOPramide (REGLAN) injection, ondansetron **OR** ondansetron (ZOFRAN) IV, oxyCODONE,  oxyCODONE, oxyCODONE  Allergies  Allergen Reactions   Other Nausea And Vomiting    All pain medications.  Can tolerate if anti-nausea medication first    Social History   Socioeconomic History   Marital status: Married    Spouse name: Not on file   Number of children: 0   Years of education: Not on file   Highest education level: Not on file  Occupational History   Occupation: retired  Tobacco Use   Smoking status: Never   Smokeless tobacco: Never  Vaping Use   Vaping Use: Never used  Substance  and Sexual Activity   Alcohol use: Yes    Alcohol/week: 12.0 standard drinks of alcohol    Types: 12 Standard drinks or equivalent per week    Comment: 2 drinks per day   Drug use: No   Sexual activity: Not Currently  Other Topics Concern   Not on file  Social History Narrative   Married no children retired from [employer]   2 alcoholic drinks a day 2 caffeinated beverages a day never smoker no drug use   Social Determinants of Corporate investment banker Strain: Not on file  Food Insecurity: No Food Insecurity (09/30/2022)   Hunger Vital Sign    Worried About Running Out of Food in the Last Year: Never true    Ran Out of Food in the Last Year: Never true  Transportation Needs: No Transportation Needs (09/30/2022)   PRAPARE - Administrator, Civil Service (Medical): No    Lack of Transportation (Non-Medical): No  Physical Activity: Not on file  Stress: Not on file  Social Connections: Not on file  Intimate Partner Violence: Not At Risk (09/30/2022)   Humiliation, Afraid, Rape, and Kick questionnaire    Fear of Current or Ex-Partner: No    Emotionally Abused: No    Physically Abused: No    Sexually Abused: No   Family History  Problem Relation Age of Onset   Tuberculosis Mother    Hypertension Father    Heart disease Father    AAA (abdominal aortic aneurysm) Father    Obesity Brother    Colon cancer Neg Hx    Esophageal cancer Neg Hx    Pancreatic cancer  Neg Hx    Stomach cancer Neg Hx    Liver disease Neg Hx    Vitals  BP (!) 142/86 (BP Location: Left Arm)   Pulse 86   Temp 98.4 F (36.9 C) (Oral)   Resp 18   Ht 5' 8.5" (1.74 m)   Wt 77.4 kg   SpO2 100%   BMI 25.57 kg/m    Physical Exam Constitutional: Elderly male sitting in the bed and appears comfortable    Comments:   Cardiovascular:     Rate and Rhythm: Normal rate and regular rhythm.     Heart sounds: S1 and S2  Pulmonary:     Effort: Pulmonary effort is normal.     Comments: Normal breath sounds  Abdominal:     Palpations: Abdomen is soft.     Tenderness: Nondistended and nontender  Musculoskeletal:        General: No swelling or tenderness in peripheral joints.  Left knee and leg is wrapped in a postsurgical dressing  Skin:    Comments: No rashes  Neurological:     General: Awake, alert and oriented grossly nonfocal, following commands  Psychiatric:        Mood and Affect: Mood normal.    Pertinent Microbiology Results for orders placed or performed during the hospital encounter of 09/16/22  Resp panel by RT-PCR (RSV, Flu A&B, Covid) Anterior Nasal Swab     Status: None   Collection Time: 09/16/22  6:15 PM   Specimen: Anterior Nasal Swab  Result Value Ref Range Status   SARS Coronavirus 2 by RT PCR NEGATIVE NEGATIVE Final   Influenza A by PCR NEGATIVE NEGATIVE Final   Influenza B by PCR NEGATIVE NEGATIVE Final    Comment: (NOTE) The Xpert Xpress SARS-CoV-2/FLU/RSV plus assay is intended as an aid in the diagnosis of  influenza from Nasopharyngeal swab specimens and should not be used as a sole basis for treatment. Nasal washings and aspirates are unacceptable for Xpert Xpress SARS-CoV-2/FLU/RSV testing.  Fact Sheet for Patients: BloggerCourse.com  Fact Sheet for Healthcare Providers: SeriousBroker.it  This test is not yet approved or cleared by the [place] FDA and has been authorized  for detection and/or diagnosis of SARS-CoV-2 by FDA under an Emergency Use Authorization (EUA). This EUA will remain in effect (meaning this test can be used) for the duration of the COVID-19 declaration under Section 564(b)(1) of the Act, 21 U.S.C. section 360bbb-3(b)(1), unless the authorization is terminated or revoked.     Resp Syncytial Virus by PCR NEGATIVE NEGATIVE Final    Comment: (NOTE) Fact Sheet for Patients: BloggerCourse.com  Fact Sheet for Healthcare Providers: SeriousBroker.it  This test is not yet approved or cleared by the [place] FDA and has been authorized for detection and/or diagnosis of SARS-CoV-2 by FDA under an Emergency Use Authorization (EUA). This EUA will remain in effect (meaning this test can be used) for the duration of the COVID-19 declaration under Section 564(b)(1) of the Act, 21 U.S.C. section 360bbb-3(b)(1), unless the authorization is terminated or revoked.  Performed at [facility], [address] [place], [place] [address]   Body fluid culture w Gram Stain     Status: Abnormal   Collection Time: 09/16/22 11:15 PM   Specimen: Body Fluid  Result Value Ref Range Status   Specimen Description FLUID  Final   Special Requests JOINT KNEE  Final   Gram Stain   Final    FEW WBC PRESENT, PREDOMINANTLY PMN FEW GRAM POSITIVE COCCI Performed at [facility], [address] [place], [place] [address]    Culture STREPTOCOCCUS GROUP G (A)  Final   Report Status 09/19/2022 FINAL  Final   Organism ID, Bacteria STREPTOCOCCUS GROUP G  Final      Susceptibility   Streptococcus group g - MIC*    CLINDAMYCIN RESISTANT Resistant     AMPICILLIN <=0.25 SENSITIVE Sensitive     ERYTHROMYCIN 2 RESISTANT Resistant     VANCOMYCIN 0.5 SENSITIVE Sensitive     CEFTRIAXONE <=0.12 SENSITIVE Sensitive     LEVOFLOXACIN 0.5 SENSITIVE Sensitive     PENICILLIN <=0.06 SENSITIVE Sensitive     *  STREPTOCOCCUS GROUP G  MRSA Next Gen by PCR, Nasal     Status: None   Collection Time: 09/18/22  6:00 AM   Specimen: Nasal Mucosa; Nasal Swab  Result Value Ref Range Status   MRSA by PCR Next Gen NOT DETECTED NOT DETECTED Final    Comment: (NOTE) The GeneXpert MRSA Assay (FDA approved for NASAL specimens only), is one component of a comprehensive MRSA colonization surveillance program. It is not intended to diagnose MRSA infection nor to guide or monitor treatment for MRSA infections. Test performance is not FDA approved in patients less than 81 years old. Performed at [facility], [address] [place], [place] [address]      Pertinent Lab seen by me:    Latest Ref Rng & Units 10/01/2022    3:37 AM 09/18/2022    4:13 AM 09/17/2022   12:35 AM  CBC  WBC 4.0 - 10.5 K/uL 12.6  11.1  12.4   Hemoglobin 13.0 - 17.0 g/dL 9.3  03.4  74.2   Hematocrit 39.0 - 52.0 % 27.7  32.4  31.4   Platelets 150 - 400 K/uL 652  134  143       Latest Ref Rng & Units 10/01/2022    3:37 AM 09/18/2022    4:13 AM 09/17/2022   12:35 AM  CMP  Glucose 70 - 99 mg/dL 161  096  045   BUN 8 - 23 mg/dL 16  23  19    Creatinine 0.61 - 1.24 mg/dL 4.09  8.11  9.14   Sodium 135 - 145 mmol/L 133  132  135   Potassium 3.5 - 5.1 mmol/L 4.3  4.0  4.3   Chloride 98 - 111 mmol/L 105  107  106   CO2 22 - 32 mmol/L 22  21  19    Calcium 8.9 - 10.3 mg/dL 8.3  8.2  8.3   Total Protein 6.5 - 8.1 g/dL   4.6   Total Bilirubin 0.3 - 1.2 mg/dL   0.9   Alkaline Phos 38 - 126 U/L   35   AST 15 - 41 U/L   34   ALT 0 - 44 U/L   21      Pertinent Imagings/Other Imagings Plain films and CT images have been personally visualized and interpreted; radiology reports have been reviewed. Decision making incorporated into the Impression / Recommendations.  [place] EKG SITE RITE  Result Date: 09/30/2022 If Site Rite image not attached, placement could not be confirmed due to current cardiac rhythm.  CT Knee Left Wo Contrast  Result  Date: 09/17/2022 CLINICAL DATA:  Knee replacement, periprosthetic fracture suspected EXAM: CT OF THE LEFT KNEE WITHOUT CONTRAST TECHNIQUE: Multidetector CT imaging of the left knee was performed according to the standard protocol. Multiplanar CT image reconstructions were also generated. RADIATION DOSE REDUCTION: This exam was performed according to the departmental dose-optimization program which includes automated exposure control, adjustment of the mA and/or kV according to patient size and/or use of iterative reconstruction technique. COMPARISON:  None Available. FINDINGS: Bones/Joint/Cartilage Surgical changes of left total knee arthroplasty are identified. Normal alignment. No acute fracture or dislocation. No periprosthetic lucency or hardware fracture identified. No osseous erosions. 16 x 12 mm intra-articular loose body noted within the suprapatellar recess. Ligaments Suboptimally assessed by CT. Muscles and Tendons Normal muscle bulk. Quadriceps tendon is intact. Patellar tendon is obscured partially by streak artifact. The visualized portion is unremarkable. Soft tissues Capsular calcifications are seen within the suprapatellar recess. Moderate left knee effusion. IMPRESSION: 1. Surgical changes of left total knee arthroplasty without evidence of fracture or hardware complication. 2. 16 x 12 mm intra-articular loose body within the suprapatellar recess. 3. Moderate left knee effusion. Electronically Signed   By: Helyn Numbers M.D.   On: 09/17/2022 00:41   DG Chest Portable 1 View  Result Date: 09/16/2022 CLINICAL DATA:  Syncope EXAM: PORTABLE CHEST 1 VIEW COMPARISON:  None Available. FINDINGS: Mild elevation of the left hemidiaphragm with left basilar linear atelectasis. Lungs are otherwise clear. No pneumothorax. Tiny left pleural effusion. Cardiac size within normal limits. Pulmonary vascularity is normal. No acute bone abnormality. IMPRESSION: 1. Mild elevation of the left hemidiaphragm with left  basilar atelectasis. Tiny left pleural effusion. Electronically Signed   By: Helyn Numbers M.D.   On: 09/16/2022 19:32   DG Knee Left Port  Result Date: 09/16/2022 CLINICAL DATA:  Pain EXAM: PORTABLE LEFT KNEE - 2 VIEW COMPARISON:  None Available. FINDINGS: Portable x-rays demonstrates total knee arthroplasty with cemented femoral and tibial component. Patellar button. Expected alignment. No hardware failure. Osteopenia. Soft tissue calcifications are seen surrounding the knee. No joint effusion. IMPRESSION:  Surgical changes of total knee arthroplasty. Electronically Signed   By: Karen Kays M.D.   On: 09/16/2022 17:58     I have personally spent 83 minutes involved in face-to-face and non-face-to-face activities for this patient on the day of the visit. Professional time spent includes the following activities: Preparing to see the patient (review of tests), Obtaining and/or reviewing separately obtained history (admission/discharge record), Performing a medically appropriate examination and/or evaluation , Ordering medications/tests/procedures, referring and communicating with other health care professionals, Documenting clinical information in the EMR, Independently interpreting results (not separately reported), Communicating results to the patient/family/caregiver, Counseling and educating the patient/family/caregiver and Care coordination (not separately reported).  Electronically signed by:   Plan d/w requesting provider as well as ID pharm D  Note: This document was prepared using dragon voice recognition software and may include unintentional dictation errors.   Odette Fraction, MD Infectious Disease Physician [facility] Pager: [phone]

## 2022-09-30 NOTE — Anesthesia Procedure Notes (Signed)
Spinal  Patient location during procedure: OR Start time: 09/30/2022 12:29 PM End time: 09/30/2022 12:32 PM Reason for block: surgical anesthesia Staffing Performed: resident/CRNA  Anesthesiologist: Linton Rump, MD Resident/CRNA: Doran Clay, CRNA Performed by: Doran Clay, CRNA Authorized by: Linton Rump, MD   Preanesthetic Checklist Completed: patient identified, IV checked, site marked, risks and benefits discussed, surgical consent, monitors and equipment checked, pre-op evaluation and timeout performed Spinal Block Patient position: sitting Prep: DuraPrep Patient monitoring: heart rate, cardiac monitor, continuous pulse ox and blood pressure Approach: midline Location: L3-4 Needle Needle type: Pencan  Needle gauge: 24 G Needle length: 9 cm Needle insertion depth: 7 cm Assessment Sensory level: T6 Events: CSF return Additional Notes Timeout performed. Patient in sitting position. L3-4 identified. Cleansed with duraprep. SAB without difficulty. To supine position.

## 2022-09-30 NOTE — Interval H&P Note (Signed)
History and Physical Interval Note:  09/30/2022 11:09 AM  Charles Norton  has presented today for surgery, with the diagnosis of Infection of left total knee arthroplasty.  The various methods of treatment have been discussed with the patient and family. After consideration of risks, benefits and other options for treatment, the patient has consented to  Procedure(s): EXCISIONAL TOTAL KNEE ARTHROPLASTY WITH ANTIBIOTIC SPACERS (Left) as a surgical intervention.  The patient's history has been reviewed, patient examined, no change in status, stable for surgery.  I have reviewed the patient's chart and labs.  Questions were answered to the patient's satisfaction.     Shelda Pal

## 2022-10-01 ENCOUNTER — Encounter (HOSPITAL_COMMUNITY): Payer: Self-pay | Admitting: Orthopedic Surgery

## 2022-10-01 LAB — BASIC METABOLIC PANEL
Anion gap: 6 (ref 5–15)
BUN: 16 mg/dL (ref 8–23)
CO2: 22 mmol/L (ref 22–32)
Calcium: 8.3 mg/dL — ABNORMAL LOW (ref 8.9–10.3)
Chloride: 105 mmol/L (ref 98–111)
Creatinine, Ser: 0.99 mg/dL (ref 0.61–1.24)
GFR, Estimated: >60 mL/min (ref >60)
Glucose, Bld: 124 mg/dL — ABNORMAL HIGH (ref 70–99)
Potassium: 4.3 mmol/L (ref 3.5–5.1)
Sodium: 133 mmol/L — ABNORMAL LOW (ref 135–145)

## 2022-10-01 LAB — CBC
HCT: 27.7 % — ABNORMAL LOW (ref 39.0–52.0)
Hemoglobin: 9.3 g/dL — ABNORMAL LOW (ref 13.0–17.0)
MCH: 32.5 pg (ref 26.0–34.0)
MCHC: 33.6 g/dL (ref 30.0–36.0)
MCV: 96.9 fL (ref 80.0–100.0)
Platelets: 652 10*3/uL — ABNORMAL HIGH (ref 150–400)
RBC: 2.86 MIL/uL — ABNORMAL LOW (ref 4.22–5.81)
RDW: 13.3 % (ref 11.5–15.5)
WBC: 12.6 10*3/uL — ABNORMAL HIGH (ref 4.0–10.5)
nRBC: 0 % (ref 0.0–0.2)

## 2022-10-01 LAB — SEDIMENTATION RATE: Sed Rate: 77 mm/hr — ABNORMAL HIGH (ref 0–16)

## 2022-10-01 LAB — C-REACTIVE PROTEIN: CRP: 4.7 mg/dL — ABNORMAL HIGH (ref <1.0)

## 2022-10-01 MED ORDER — METHOCARBAMOL 500 MG PO TABS: 500.0000 mg | ORAL_TABLET | Freq: Four times a day (QID) | ORAL | 2 refills | Status: DC | PRN

## 2022-10-01 MED ORDER — PENICILLIN G POTASSIUM IV (FOR PTA / DISCHARGE USE ONLY): 20.0000 10*6.[IU] | INTRAVENOUS | 0 refills | Status: AC

## 2022-10-01 MED ORDER — SODIUM CHLORIDE 0.9% FLUSH: 10.0000 mL | INTRAVENOUS | Status: DC | PRN

## 2022-10-01 MED ORDER — SENNA 8.6 MG PO TABS: 2.0000 | ORAL_TABLET | Freq: Every day | ORAL | 0 refills | Status: AC

## 2022-10-01 MED ORDER — POLYETHYLENE GLYCOL 3350 17 G PO PACK: 17.0000 g | PACK | Freq: Two times a day (BID) | ORAL | 0 refills | Status: DC

## 2022-10-01 MED ORDER — CHLORHEXIDINE GLUCONATE CLOTH 2 % EX PADS
6.0000 | MEDICATED_PAD | Freq: Every day | CUTANEOUS | Status: DC
  Administered 2022-10-01 – 2022-10-02 (×2): 6 via TOPICAL

## 2022-10-01 MED ORDER — OXYCODONE HCL 5 MG PO TABS: 5.0000 mg | ORAL_TABLET | ORAL | 0 refills | Status: DC | PRN

## 2022-10-01 MED ORDER — ASPIRIN 81 MG PO CHEW: 81.0000 mg | CHEWABLE_TABLET | Freq: Two times a day (BID) | ORAL | 0 refills | Status: AC

## 2022-10-01 MED ORDER — SODIUM CHLORIDE 0.9% FLUSH: 10.0000 mL | Freq: Two times a day (BID) | INTRAVENOUS | Status: DC

## 2022-10-01 NOTE — Plan of Care (Signed)
  Problem: Education: Goal: Knowledge of the prescribed therapeutic regimen will improve Outcome: Progressing   Problem: Activity: Goal: Range of joint motion will improve Outcome: Progressing   Problem: Pain Management: Goal: Pain level will decrease with appropriate interventions Outcome: Progressing   Problem: Safety: Goal: Ability to remain free from injury will improve Outcome: Progressing   

## 2022-10-01 NOTE — Progress Notes (Signed)
Physical Therapy Treatment Patient Details Name: Charles Norton MRN: [id number] DOB: 1941/01/23 Today's Date: 10/01/2022   History of Present Illness 82 yo male presents to therapy s/p excisional L TKA with ABX spacer on 09/30/2022 due to L knee pain and was found to have strep infection on 4/23. Pt presented to [facility] ED via EMS on 4/23 secondary to weakness and dehydration with dx of AKI and L knee pain. L knee aspiration revealed strep G. Pt is currently LLE 50% WB with KI donned. Pt has PMH including but not limited to: AKI, SIRS, HTN, L TKA (2018), and B RTC repair.    PT Comments    Pt is progressing well with mobility, he tolerated increased ambulation distance of 100' with RW, no loss of balance, L KI in place, VCs for 50% WB status. Instructed pt in HEP.    Recommendations for follow up therapy are one component of a multi-disciplinary discharge planning process, led by the attending physician.  Recommendations may be updated based on patient status, additional functional criteria and insurance authorization.  Follow Up Recommendations       Assistance Recommended at Discharge Intermittent Supervision/Assistance  Patient can return home with the following A little help with walking and/or transfers;A little help with bathing/dressing/bathroom;Assistance with cooking/housework;Assist for transportation;Help with stairs or ramp for entrance   Equipment Recommendations  None recommended by PT (pt reports DME in home setting)    Recommendations for Other Services       Precautions / Restrictions Precautions Precautions: Fall;Knee Restrictions Weight Bearing Restrictions: Yes LLE Weight Bearing: Partial weight bearing LLE Partial Weight Bearing Percentage or Pounds: 50 Other Position/Activity Restrictions: LLE KI donned     Mobility  Bed Mobility Overal bed mobility: Needs Assistance Bed Mobility: Sit to Supine, Supine to Sit     Supine to sit: Min guard, HOB elevated Sit to  supine: Min guard   General bed mobility comments: used gait belt as LLE lifter, VCs for technique and min/guard to guard LLE    Transfers Overall transfer level: Needs assistance Equipment used: Rolling walker (2 wheels) Transfers: Sit to/from Stand Sit to Stand: Supervision           General transfer comment: VCs hand placement    Ambulation/Gait Ambulation/Gait assistance: Supervision Gait Distance (Feet): 100 Feet Assistive device: Rolling walker (2 wheels) Gait Pattern/deviations: Step-to pattern, Decreased step length - right, Decreased step length - left, Trunk flexed Gait velocity: decreased     General Gait Details: reliance on B UE support, cues for LLE 50% WB with good follow through and KI donned; no loss of balance   Stairs             Wheelchair Mobility    Modified Rankin (Stroke Patients Only)       Balance Overall balance assessment: Needs assistance Sitting-balance support: Feet supported, Bilateral upper extremity supported Sitting balance-Leahy Scale: Good     Standing balance support: Bilateral upper extremity supported, During functional activity, Reliant on assistive device for balance Standing balance-Leahy Scale: Poor                              Cognition Arousal/Alertness: Awake/alert Behavior During Therapy: WFL for tasks assessed/performed Overall Cognitive Status: Within Functional Limits for tasks assessed  Exercises Total Joint Exercises Ankle Circles/Pumps: AROM, Both, 10 reps, Supine Quad Sets: AROM, Both, 5 reps, Supine Hip ABduction/ADduction: AAROM, Left, 10 reps, Supine Straight Leg Raises: AAROM, Left, 5 reps, Supine    General Comments        Pertinent Vitals/Pain Pain Assessment Pain Score: 3  Pain Location: L knee Pain Descriptors / Indicators: Aching, Discomfort, Constant Pain Intervention(s): Limited activity within patient's  tolerance, Monitored during session, Ice applied    Home Living                          Prior Function            PT Goals (current goals can now be found in the care plan section) Acute Rehab PT Goals Patient Stated Goal: improve IND with functional mobiltiy, get over this and play tennis again PT Goal Formulation: With patient Time For Goal Achievement: 10/14/22 Potential to Achieve Goals: Good Progress towards PT goals: Progressing toward goals    Frequency    7X/week      PT Plan Current plan remains appropriate    Co-evaluation              AM-PAC PT "6 Clicks" Mobility   Outcome Measure  Help needed turning from your back to your side while in a flat bed without using bedrails?: A Little Help needed moving from lying on your back to sitting on the side of a flat bed without using bedrails?: A Little Help needed moving to and from a bed to a chair (including a wheelchair)?: A Little Help needed standing up from a chair using your arms (e.g., wheelchair or bedside chair)?: A Little Help needed to walk in hospital room?: A Little Help needed climbing 3-5 steps with a railing? : A Little 6 Click Score: 18    End of Session Equipment Utilized During Treatment: Gait belt Activity Tolerance: Patient tolerated treatment well;Patient limited by fatigue Patient left: in bed;with call bell/phone within reach Nurse Communication: Mobility status PT Visit Diagnosis: Unsteadiness on feet (R26.81);Other abnormalities of gait and mobility (R26.89);Muscle weakness (generalized) (M62.81);Pain Pain - Right/Left: Left Pain - part of body: Knee;Leg     Time: 1308-6578 PT Time Calculation (min) (ACUTE ONLY): 27 min  Charges:  $Gait Training: 8-22 mins $Therapeutic Exercise: 8-22 mins                     Ralene Bathe Kistler PT 10/01/2022  Acute Rehabilitation Services  Office [phone]

## 2022-10-01 NOTE — Progress Notes (Signed)
PHARMACY CONSULT NOTE FOR:  OUTPATIENT  PARENTERAL ANTIBIOTIC THERAPY (OPAT)  Indication: Group G Strep PJI Regimen: Penicillin G 20 million units daily as a continuous infusion End date: 11/11/22 (6 weeks from OR 09/30/22)  IV antibiotic discharge orders are pended. To discharging provider:  please sign these orders via discharge navigator,  Select New Orders & click on the button choice - Manage This Unsigned Work.     Thank you for allowing pharmacy to be a part of this patient's care.  Georgina Pillion, PharmD, BCPS Infectious Diseases Clinical Pharmacist 10/01/2022 10:59 AM   **Pharmacist phone directory can now be found on [email or web address] ([email or web address]).  Listed under [facility].

## 2022-10-01 NOTE — Plan of Care (Signed)
  Problem: Education: Goal: Knowledge of the prescribed therapeutic regimen will improve Outcome: Progressing   Problem: Activity: Goal: Range of joint motion will improve Outcome: Progressing   Problem: Safety: Goal: Ability to remain free from injury will improve Outcome: Progressing   Problem: Pain Management: Goal: Pain level will decrease with appropriate interventions Outcome: Progressing

## 2022-10-01 NOTE — Progress Notes (Signed)
Orthopedic Tech Progress Note Patient Details:  Charles Norton [birth year] [id number]  Patient ID: [name], male   DOB: 30-Apr-1941, 82 y.o.   MRN: [id number]  [name] 10/01/2022, 12:06 PM Left knee immobilizer applied . Original soiled.

## 2022-10-01 NOTE — Progress Notes (Signed)
Peripherally Inserted Central Catheter Placement  The IV Nurse has discussed with the patient and/or persons authorized to consent for the patient, the purpose of this procedure and the potential benefits and risks involved with this procedure.  The benefits include less needle sticks, lab draws from the catheter, and the patient may be discharged home with the catheter. Risks include, but not limited to, infection, bleeding, blood clot (thrombus formation), and puncture of an artery; nerve damage and irregular heartbeat and possibility to perform a PICC exchange if needed/ordered by physician.  Alternatives to this procedure were also discussed.  Bard Power PICC patient education guide, fact sheet on infection prevention and patient information card has been provided to patient /or left at bedside.    PICC Placement Documentation  PICC Single Lumen 10/01/22 Right Brachial 37 cm 0 cm (Active)  Indication for Insertion or Continuance of Line Home intravenous therapies (PICC only) 10/01/22 1100  Exposed Catheter (cm) 0 cm 10/01/22 1100  Site Assessment Clean, Dry, Intact 10/01/22 1100  Line Status Flushed;Saline locked;Blood return noted 10/01/22 1100  Dressing Type Transparent;Securing device 10/01/22 1100  Dressing Status Antimicrobial disc in place;Clean, Dry, Intact 10/01/22 1100  Safety Lock Not Applicable 10/01/22 1100  Line Care Connections checked and tightened 10/01/22 1100  Line Adjustment (NICU/IV Team Only) No 10/01/22 1100  Dressing Intervention New dressing 10/01/22 1100  Dressing Change Due 10/08/22 10/01/22 1100       Charles Norton 10/01/2022, 11:01 AM

## 2022-10-01 NOTE — Progress Notes (Signed)
   Subjective: 1 Day Post-Op Procedure(s) (LRB): EXCISIONAL TOTAL KNEE ARTHROPLASTY WITH ANTIBIOTIC SPACERS (Left) Patient reports pain as mild.   Patient seen in rounds by Dr. [name]. Patient is well, and has had no acute complaints or problems. No acute events overnight. Foley catheter removed. Patient ambulated 35 feet with PT.  We will start therapy today.   Objective: Vital signs in last 24 hours: Temp:  [97 F (36.1 C)-99.5 F (37.5 C)] 98.4 F (36.9 C) (05/08 0612) Pulse Rate:  [54-90] 86 (05/08 0612) Resp:  [10-18] 18 (05/08 0612) BP: (116-159)/(63-86) 142/86 (05/08 0612) SpO2:  [98 %-100 %] 100 % (05/07 2134) Weight:  [77.4 kg] 77.4 kg (05/07 1055)  Intake/Output from previous day:  Intake/Output Summary (Last 24 hours) at 10/01/2022 0804 Last data filed at 10/01/2022 0600 Gross per 24 hour  Intake 3541.58 ml  Output 2500 ml  Net 1041.58 ml     Intake/Output this shift: No intake/output data recorded.  Labs: Recent Labs    10/01/22 0337  HGB 9.3*   Recent Labs    10/01/22 0337  WBC 12.6*  RBC 2.86*  HCT 27.7*  PLT 652*   Recent Labs    10/01/22 0337  NA 133*  K 4.3  CL 105  CO2 22  BUN 16  CREATININE 0.99  GLUCOSE 124*  CALCIUM 8.3*   No results for input(s): "LABPT", "INR" in the last 72 hours.  Exam: General - Patient is Alert and Oriented Extremity - Neurologically intact Sensation intact distally Intact pulses distally Dorsiflexion/Plantar flexion intact Dressing - dressing C/D/I Motor Function - intact, moving foot and toes well on exam.   Past Medical History:  Diagnosis Date   Adenomatous colon polyp    Anemia    Arthritis    Basal cell carcinoma    BPH (benign prostatic hyperplasia)    Cataract of left eye    due to trauma in left eye   Diverticulosis    in sigmoid colon and in the descending colon   Hemorrhoids    Hypercholesterolemia    Hypertension    Iron deficiency    anemia   Lichen planus    of tongue    Osteoarthritis    PONV (postoperative nausea and vomiting)    Rectal prolapse    Shingles    right ophthalmic involvement   Tinnitus     Assessment/Plan: 1 Day Post-Op Procedure(s) (LRB): EXCISIONAL TOTAL KNEE ARTHROPLASTY WITH ANTIBIOTIC SPACERS (Left) Principal Problem:   Infection of prosthetic left knee joint (HCC)  Estimated body mass index is 25.57 kg/m as calculated from the following:   Height as of this encounter: 5' 8.5" (1.74 m).   Weight as of this encounter: 77.4 kg. Advance diet Up with therapy    DVT Prophylaxis - Aspirin PWB, use knee immobilizer when up  Hgb stable at 9.3 this AM.  PICC placement today ID to see patient for recommendations - culture/sensitivities in chart  Discharge tomorrow if meeting goals with PT and [facility] services / meds arranged   Dennie Bible, PA-C Orthopedic Surgery [phone] 10/01/2022, 8:04 AM

## 2022-10-01 NOTE — TOC Initial Note (Signed)
Transition of Care [facility]) - Initial/Assessment Note   Patient Details  Name: Charles Norton MRN: [id number] Date of Birth: April 21, 1941  Transition of Care [facility]) CM/SW Contact:    Ewing Schlein, LCSW Phone Number: 10/01/2022, 1:48 PM  Clinical Narrative: [facility] consulted for home IV antibiotics. CSW met with patient to discuss consult and patient is agreeable to referrals. Patient is active with [facility] and CSW confirmed with Tresa Endo with [facility] the agency can provide weekly nursing visits. CSW made home IV antibiotics referral to [facility] with [facility]. HHRN orders have been placed. CSW met with patient and spouse to provide an update.                Expected Discharge Plan: Home w Home Health Services Barriers to Discharge: No Barriers Identified  Patient Goals and CMS Choice Patient states their goals for this hospitalization and ongoing recovery are:: Return home with [facility] and IV antibiotics CMS Medicare.gov Compare Post Acute Care list provided to:: Patient Choice offered to / list presented to : Patient  Expected Discharge Plan and Services In-house Referral: Clinical Social Work Post Acute Care Choice: Home Health Living arrangements for the past 2 months: Single Family Home           DME Arranged: N/A DME Agency: NA HH Arranged: RN, IV Antibiotics HH Agency: [facility] Date HH Agency Contacted: 10/01/22 Representative spoke with at [facility] Agency: Elita Quick ([facility]), Tresa Endo ([facility])  Prior Living Arrangements/Services Living arrangements for the past 2 months: Single Family Home Lives with:: Spouse Patient language and need for interpreter reviewed:: Yes Do you feel safe going back to the place where you live?: Yes      Need for Family Participation in Patient Care: No (Comment) Care giver support system in place?: Yes (comment) Criminal Activity/Legal Involvement Pertinent to Current Situation/Hospitalization: No - Comment as needed  Activities of Daily  Living Home Assistive Devices/Equipment: Eyeglasses, Dan Humphreys (specify type) ADL Screening (condition at time of admission) Patient's cognitive ability adequate to safely complete daily activities?: Yes Is the patient deaf or have difficulty hearing?: No Does the patient have difficulty seeing, even when wearing glasses/contacts?: Yes Does the patient have difficulty concentrating, remembering, or making decisions?: No Patient able to express need for assistance with ADLs?: Yes Does the patient have difficulty dressing or bathing?: No Independently performs ADLs?: Yes (appropriate for developmental age) Does the patient have difficulty walking or climbing stairs?: No Weakness of Legs: Both Weakness of Arms/Hands: None  Permission Sought/Granted Permission sought to share information with : Other (comment) Permission granted to share information with : Yes, Verbal Permission Granted Permission granted to share info w AGENCY: HH agencies, [facility]  Emotional Assessment Appearance:: Appears stated age Attitude/Demeanor/Rapport: Engaged Affect (typically observed): Accepting, Appropriate Orientation: : Oriented to Self, Oriented to Place, Oriented to  Time, Oriented to Situation Alcohol / Substance Use: Not Applicable Psych Involvement: No (comment)  Admission diagnosis:  Infection of prosthetic left knee joint (HCC) [T84.54XA] Patient Active Problem List   Diagnosis Date Noted   Infection of prosthetic left knee joint (HCC) 09/30/2022   AKI (acute kidney injury) (HCC) 09/17/2022   Generalized weakness 09/17/2022   Dehydration 09/17/2022   Pain and swelling of knee, left 09/17/2022   SIRS (systemic inflammatory response syndrome) (HCC) 09/17/2022   BPH (benign prostatic hyperplasia) 09/17/2022   Essential hypertension 09/17/2022   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 [facility] [address] [place] [address] Phone: [phone] Fax: [phone]  [facility] [address] [place] [address] Phone: [phone] Fax: [phone]  Social Determinants of Health (SDOH) Social History: SDOH Screenings   Food Insecurity: No Food Insecurity (09/30/2022)  Housing: Low Risk  (09/30/2022)  Transportation Needs: No Transportation Needs (09/30/2022)  Utilities: Not At Risk (09/30/2022)  Tobacco Use: Low Risk  (10/01/2022)   SDOH Interventions:    Readmission Risk Interventions     No data to display

## 2022-10-02 LAB — CBC
HCT: 26.7 % — ABNORMAL LOW (ref 39.0–52.0)
Hemoglobin: 8.9 g/dL — ABNORMAL LOW (ref 13.0–17.0)
MCH: 32.2 pg (ref 26.0–34.0)
MCHC: 33.3 g/dL (ref 30.0–36.0)
MCV: 96.7 fL (ref 80.0–100.0)
Platelets: 568 10*3/uL — ABNORMAL HIGH (ref 150–400)
RBC: 2.76 MIL/uL — ABNORMAL LOW (ref 4.22–5.81)
RDW: 13.5 % (ref 11.5–15.5)
WBC: 10.5 10*3/uL (ref 4.0–10.5)
nRBC: 0 % (ref 0.0–0.2)

## 2022-10-02 MED ORDER — HEPARIN SOD (PORK) LOCK FLUSH 100 UNIT/ML IV SOLN
250.0000 [IU] | INTRAVENOUS | Status: AC | PRN
  Administered 2022-10-02: 250 [IU]
  Filled 2022-10-02: qty 2.5

## 2022-10-02 NOTE — Progress Notes (Signed)
Physical Therapy Treatment Patient Details Name: Charles Norton MRN: [id number] DOB: 11-Dec-1940 Today's Date: 10/02/2022   History of Present Illness 82 yo male presents to therapy s/p excisional L TKA with ABX spacer on 09/30/2022 due to L knee pain and was found to have strep infection on 4/23. Pt presented to [facility] ED via EMS on 4/23 secondary to weakness and dehydration with dx of AKI and L knee pain. L knee aspiration revealed strep G. Pt is currently LLE 50% WB with KI donned. Pt has PMH including but not limited to: AKI, SIRS, HTN, L TKA (2018), and B RTC repair.    PT Comments    Progressing toward goals, incr tol to activity/gait distance. Discussed stairs with regard to [facility] and will need to review prior to d/c.    Recommendations for follow up therapy are one component of a multi-disciplinary discharge planning process, led by the attending physician.  Recommendations may be updated based on patient status, additional functional criteria and insurance authorization.  Follow Up Recommendations       Assistance Recommended at Discharge Intermittent Supervision/Assistance  Patient can return home with the following A little help with walking and/or transfers;A little help with bathing/dressing/bathroom;Assistance with cooking/housework;Assist for transportation;Help with stairs or ramp for entrance   Equipment Recommendations  None recommended by PT    Recommendations for Other Services       Precautions / Restrictions Precautions Precautions: Fall;Knee Required Braces or Orthoses: Knee Immobilizer - Left Knee Immobilizer - Left: On when out of bed or walking Restrictions LLE Weight Bearing: Partial weight bearing LLE Partial Weight Bearing Percentage or Pounds: 50     Mobility  Bed Mobility Overal bed mobility: Needs Assistance Bed Mobility: Sit to Supine       Sit to supine: Min assist   General bed mobility comments: assist with LLE    Transfers Overall transfer  level: Needs assistance Equipment used: Rolling walker (2 wheels) Transfers: Sit to/from Stand Sit to Stand: Supervision           General transfer comment: VCs hand placement    Ambulation/Gait Ambulation/Gait assistance: Supervision Gait Distance (Feet): 60 Feet Assistive device: Rolling walker (2 wheels) Gait Pattern/deviations: Step-to pattern, Decreased step length - right, Decreased step length - left, Trunk flexed Gait velocity: decreased     General Gait Details: reliance on B UE support, cues for LLE 50% WB with good adherence to PWB or less; no loss of balance   Stairs             Wheelchair Mobility    Modified Rankin (Stroke Patients Only)       Balance           Standing balance support: Bilateral upper extremity supported, During functional activity, Reliant on assistive device for balance Standing balance-Leahy Scale: Poor                              Cognition Arousal/Alertness: Awake/alert Behavior During Therapy: WFL for tasks assessed/performed Overall Cognitive Status: Within Functional Limits for tasks assessed                                 General Comments: Pt AAO x4 with good safety awareness, demonstrates carryover from previous sessions        Exercises Total Joint Exercises Ankle Circles/Pumps: AROM, Both, 10 reps, Supine Quad Sets: AROM,  Both, 5 reps, Supine Straight Leg Raises: Limitations Straight Leg Raises Limitations: pain    General Comments        Pertinent Vitals/Pain Pain Assessment Pain Assessment: 0-10 Pain Score: 4  Pain Location: L knee Pain Descriptors / Indicators: Aching, Discomfort, Constant Pain Intervention(s): Limited activity within patient's tolerance, Monitored during session, Premedicated before session, Repositioned, Ice applied    Home Living                          Prior Function            PT Goals (current goals can now be found in the  care plan section) Acute Rehab PT Goals Patient Stated Goal: improve IND with functional mobiltiy, get over this and play tennis again PT Goal Formulation: With patient Time For Goal Achievement: 10/14/22 Potential to Achieve Goals: Good Progress towards PT goals: Progressing toward goals    Frequency    7X/week      PT Plan Current plan remains appropriate    Co-evaluation              AM-PAC PT "6 Clicks" Mobility   Outcome Measure  Help needed turning from your back to your side while in a flat bed without using bedrails?: A Little Help needed moving from lying on your back to sitting on the side of a flat bed without using bedrails?: A Little Help needed moving to and from a bed to a chair (including a wheelchair)?: A Little Help needed standing up from a chair using your arms (e.g., wheelchair or bedside chair)?: A Little Help needed to walk in hospital room?: A Little Help needed climbing 3-5 steps with a railing? : A Little 6 Click Score: 18    End of Session Equipment Utilized During Treatment: Gait belt Activity Tolerance: Patient tolerated treatment well Patient left: in bed;with call bell/phone within reach;with bed alarm set   PT Visit Diagnosis: Unsteadiness on feet (R26.81);Other abnormalities of gait and mobility (R26.89);Muscle weakness (generalized) (M62.81);Pain Pain - Right/Left: Left Pain - part of body: Knee;Leg     Time: 1610-9604 PT Time Calculation (min) (ACUTE ONLY): 13 min  Charges:  $Gait Training: 8-22 mins                     Delice Bison, PT  Acute Rehab Dept ([facility]) [phone]  10/02/2022    [facility] 10/02/2022, 11:28 AM

## 2022-10-02 NOTE — Progress Notes (Signed)
PT TX NOTE  10/02/22 1400  PT Visit Information  Last PT Received On 10/02/22  Assistance Needed +1  Pt making good progress; wife present for session. Pt feels ready to d/c from mobility standpoint. Handout reviewed for reinforcement of stair technique   History of Present Illness 82 yo male presents to therapy s/p excisional L TKA with ABX spacer on 09/30/2022 due to L knee pain and was found to have strep infection on 4/23. Pt presented to [facility] ED via EMS on 4/23 secondary to weakness and dehydration with dx of AKI and L knee pain. L knee aspiration revealed strep G. Pt is currently LLE 50% WB with KI donned. Pt has PMH including but not limited to: AKI, SIRS, HTN, L TKA (2018), and B RTC repair.  Subjective Data  Patient Stated Goal improve IND with functional mobiltiy, get over this and play tennis again  Precautions  Precautions Fall;Knee  Required Braces or Orthoses Knee Immobilizer - Left  Knee Immobilizer - Left On when out of bed or walking  Restrictions  Weight Bearing Restrictions Yes  LLE Weight Bearing PWB  LLE Partial Weight Bearing Percentage or Pounds 50  Other Position/Activity Restrictions reviewed donning and doffing KI as well as skin checks/padding  Pain Assessment  Pain Assessment 0-10  Pain Score 4  Pain Location L knee  Pain Descriptors / Indicators Aching;Discomfort;Constant  Pain Intervention(s) Limited activity within patient's tolerance;Monitored during session;Repositioned  Cognition  Arousal/Alertness Awake/alert  Behavior During Therapy WFL for tasks assessed/performed  Overall Cognitive Status Within Functional Limits for tasks assessed  General Comments Pt AAO x4 with good safety awareness, demonstrates carryover from previous sessions  Bed Mobility  Overal bed mobility Needs Assistance  Bed Mobility Sit to Supine;Supine to Sit  Supine to sit Supervision  Sit to supine Min assist  General bed mobility comments incr time , able to self assist with gait  belt to come to sit;assist with LLE to return to supine  Transfers  Overall transfer level Needs assistance  Equipment used Rolling walker (2 wheels)  Transfers Sit to/from Stand  Sit to Stand Supervision  General transfer comment self cues for  hand placement  Ambulation/Gait  Ambulation/Gait assistance Supervision  Gait Distance (Feet) 40 Feet  Assistive device Rolling walker (2 wheels)  Gait Pattern/deviations Step-to pattern;Decreased step length - right;Decreased step length - left;Trunk flexed  General Gait Details reliance on B UE support, cues for LLE 50% WB with good adherence to PWB or less; no loss of balance  Gait velocity decreased  Stairs Yes  Stairs assistance Min guard;Min assist  Stair Management One rail Right;With crutches;Step to pattern;Forwards  Number of Stairs 5 (x2)  General stair comments multi-modal cues for technique and  sequence; good stability, min assist for descent, min/guard for ascent. wife present and able to return demo  Balance  Standing balance support Bilateral upper extremity supported;During functional activity;Reliant on assistive device for balance  Standing balance-Leahy Scale Poor  PT - End of Session  Equipment Utilized During Treatment Gait belt  Activity Tolerance Patient tolerated treatment well  Patient left in bed;with call bell/phone within reach;with bed alarm set;with family/visitor present   PT - Assessment/Plan  PT Plan Current plan remains appropriate  PT Visit Diagnosis Unsteadiness on feet (R26.81);Other abnormalities of gait and mobility (R26.89);Muscle weakness (generalized) (M62.81);Pain  Pain - Right/Left Left  Pain - part of body Knee;Leg  PT Frequency (ACUTE ONLY) 7X/week  Follow Up Recommendations Follow physician's recommendations for discharge plan and  follow up therapies  Assistance recommended at discharge Intermittent Supervision/Assistance  Patient can return home with the following A little help with walking  and/or transfers;A little help with bathing/dressing/bathroom;Assistance with cooking/housework;Assist for transportation;Help with stairs or ramp for entrance  PT equipment None recommended by PT  AM-PAC PT "6 Clicks" Mobility Outcome Measure (Version 2)  Help needed turning from your back to your side while in a flat bed without using bedrails? 3  Help needed moving from lying on your back to sitting on the side of a flat bed without using bedrails? 3  Help needed moving to and from a bed to a chair (including a wheelchair)? 3  Help needed standing up from a chair using your arms (e.g., wheelchair or bedside chair)? 3  Help needed to walk in hospital room? 3  Help needed climbing 3-5 steps with a railing?  3  6 Click Score 18  Consider Recommendation of Discharge To: Home with [facility]  PT Goal Progression  Progress towards PT goals Progressing toward goals  Acute Rehab PT Goals  PT Goal Formulation With patient  Time For Goal Achievement 10/14/22  Potential to Achieve Goals Good  PT Time Calculation  PT Start Time (ACUTE ONLY) 1410  PT Stop Time (ACUTE ONLY) 1436  PT Time Calculation (min) (ACUTE ONLY) 26 min  PT General Charges  $$ ACUTE PT VISIT 1 Visit  PT Treatments  $Gait Training 23-37 mins

## 2022-10-02 NOTE — Progress Notes (Signed)
   Subjective: 2 Days Post-Op Procedure(s) (LRB): EXCISIONAL TOTAL KNEE ARTHROPLASTY WITH ANTIBIOTIC SPACERS (Left) Patient reports pain as mild.   Patient seen in rounds for Dr. [name]. Patient is well, and has had no acute complaints or problems. No acute events overnight. Ambulated well with PT. HHRN arranged. Wife has picked up his meds.  We will continue therapy today.   Objective: Vital signs in last 24 hours: Temp:  [98.1 F (36.7 C)-98.9 F (37.2 C)] 98.1 F (36.7 C) (05/09 0740) Pulse Rate:  [88-100] 100 (05/09 0740) Resp:  [15-18] 16 (05/09 0740) BP: (134-141)/(64-83) 136/74 (05/09 0740) SpO2:  [97 %-99 %] 98 % (05/09 0740)  Intake/Output from previous day:  Intake/Output Summary (Last 24 hours) at 10/02/2022 1326 Last data filed at 10/02/2022 0830 Gross per 24 hour  Intake 1616.04 ml  Output 1450 ml  Net 166.04 ml     Intake/Output this shift: Total I/O In: -  Out: 225 [Urine:225]  Labs: Recent Labs    10/01/22 0337 10/02/22 0412  HGB 9.3* 8.9*   Recent Labs    10/01/22 0337 10/02/22 0412  WBC 12.6* 10.5  RBC 2.86* 2.76*  HCT 27.7* 26.7*  PLT 652* 568*   Recent Labs    10/01/22 0337  NA 133*  K 4.3  CL 105  CO2 22  BUN 16  CREATININE 0.99  GLUCOSE 124*  CALCIUM 8.3*   No results for input(s): "LABPT", "INR" in the last 72 hours.  Exam: General - Patient is Alert and Oriented Extremity - Neurologically intact Sensation intact distally Intact pulses distally Dorsiflexion/Plantar flexion intact Dressing - dressing C/D/I Motor Function - intact, moving foot and toes well on exam.   Past Medical History:  Diagnosis Date   Adenomatous colon polyp    Anemia    Arthritis    Basal cell carcinoma    BPH (benign prostatic hyperplasia)    Cataract of left eye    due to trauma in left eye   Diverticulosis    in sigmoid colon and in the descending colon   Hemorrhoids    Hypercholesterolemia    Hypertension    Iron deficiency    anemia    Lichen planus    of tongue   Osteoarthritis    PONV (postoperative nausea and vomiting)    Rectal prolapse    Shingles    right ophthalmic involvement   Tinnitus     Assessment/Plan: 2 Days Post-Op Procedure(s) (LRB): EXCISIONAL TOTAL KNEE ARTHROPLASTY WITH ANTIBIOTIC SPACERS (Left) Principal Problem:   Infection of prosthetic left knee joint (HCC)  Estimated body mass index is 25.57 kg/m as calculated from the following:   Height as of this encounter: 5' 8.5" (1.74 m).   Weight as of this encounter: 77.4 kg. Advance diet Up with therapy D/C IV fluids   DVT Prophylaxis - Aspirin PWB Knee immobilizer when up   Plan is to go Home after hospital stay. Plan for discharge today following 1-2 sessions of PT as long as they are meeting their goals. Follow up in the office in 2 weeks.   Dennie Bible, PA-C Orthopedic Surgery [phone] 10/02/2022, 1:26 PM

## 2022-10-02 NOTE — TOC Transition Note (Signed)
Transition of Care [facility]) - CM/SW Discharge Note  Patient Details  Name: Charles Norton MRN: [id number] Date of Birth: 1940-08-08  Transition of Care [facility]) CM/SW Contact:  Ewing Schlein, LCSW Phone Number: 10/02/2022, 11:48 AM  Clinical Narrative: Patient will discharge home today with IV antibiotics. Pam with [facility] to complete teaching today. OPAT has been signed. Patient updated. TOC signing off.  Final next level of care: Home w Home Health Services Barriers to Discharge: No Barriers Identified  Patient Goals and CMS Choice CMS Medicare.gov Compare Post Acute Care list provided to:: Patient Choice offered to / list presented to : Patient  Discharge Plan and Services Additional resources added to the After Visit Summary for   In-house Referral: Clinical Social Work Post Acute Care Choice: Home Health          DME Arranged: N/A DME Agency: NA HH Arranged: RN, IV Antibiotics HH Agency: [facility], [facility] Date HH Agency Contacted: 10/01/22 Representative spoke with at [facility] Agency: Elita Quick ([facility]), Tresa Endo ([facility])  Social Determinants of Health (SDOH) Interventions SDOH Screenings   Food Insecurity: No Food Insecurity (09/30/2022)  Housing: Low Risk  (09/30/2022)  Transportation Needs: No Transportation Needs (09/30/2022)  Utilities: Not At Risk (09/30/2022)  Tobacco Use: Low Risk  (10/01/2022)   Readmission Risk Interventions     No data to display

## 2022-10-16 NOTE — Discharge Summary (Signed)
Patient ID: Charles Norton MRN: [id number] DOB/AGE: Oct 01, 1940 82 y.o.  Admit date: 09/30/2022 Discharge date: 10/02/2022  Admission Diagnoses:  Left knee prosthetic joint infection   Discharge Diagnoses:  Principal Problem:   Infection of prosthetic left knee joint (HCC)   Past Medical History:  Diagnosis Date   Adenomatous colon polyp    Anemia    Arthritis    Basal cell carcinoma    BPH (benign prostatic hyperplasia)    Cataract of left eye    due to trauma in left eye   Diverticulosis    in sigmoid colon and in the descending colon   Hemorrhoids    Hypercholesterolemia    Hypertension    Iron deficiency    anemia   Lichen planus    of tongue   Osteoarthritis    PONV (postoperative nausea and vomiting)    Rectal prolapse    Shingles    right ophthalmic involvement   Tinnitus     Surgeries: Procedure(s): EXCISIONAL TOTAL KNEE ARTHROPLASTY WITH ANTIBIOTIC SPACERS on 09/30/2022   Consultants:   Discharged Condition: Improved  Hospital Course: Charles Norton is an 82 y.o. male who was admitted 09/30/2022 for operative treatment ofInfection of prosthetic left knee joint (HCC). Patient has severe unremitting pain that affects sleep, daily activities, and work/hobbies. After pre-op clearance the patient was taken to the operating room on 09/30/2022 and underwent  Procedure(s): EXCISIONAL TOTAL KNEE ARTHROPLASTY WITH ANTIBIOTIC SPACERS.    Patient was given perioperative antibiotics:  Anti-infectives (From admission, onward)    Start     Dose/Rate Route Frequency Ordered Stop   10/01/22 0000  penicillin G IVPB        20 Million Units Intravenous Every 24 hours 10/01/22 1438 11/11/22 2359   09/30/22 1800  ceFAZolin (ANCEF) IVPB 2g/100 mL premix        2 g 200 mL/hr over 30 Minutes Intravenous Every 6 hours 09/30/22 1632 10/01/22 0100   09/30/22 1800  penicillin G potassium 9 Million Units in dextrose 5 % 500 mL CONTINUOUS infusion  Status:  Discontinued        9 Million  Units 41.7 mL/hr over 12 Hours Intravenous Every 12 hours 09/30/22 1530 10/02/22 2055   09/30/22 1332  vancomycin (VANCOCIN) powder  Status:  Discontinued          As needed 09/30/22 1332 09/30/22 1616   09/30/22 1331  tobramycin (NEBCIN) powder  Status:  Discontinued          As needed 09/30/22 1332 09/30/22 1616   09/30/22 1045  ceFAZolin (ANCEF) IVPB 2g/100 mL premix        2 g 200 mL/hr over 30 Minutes Intravenous On call to O.R. 09/30/22 1036 09/30/22 1233        Patient was given sequential compression devices, early ambulation, and chemoprophylaxis to prevent DVT. Patient worked with PT and was meeting their goals regarding safe ambulation and transfers. He received a PICC line and arrangements were made for [facility] to assist with abx.   Patient benefited maximally from hospital stay and there were no complications.    Recent vital signs: No data found.   Recent laboratory studies: No results for input(s): "WBC", "HGB", "HCT", "PLT", "NA", "K", "CL", "CO2", "BUN", "CREATININE", "GLUCOSE", "INR", "CALCIUM" in the last 72 hours.  Invalid input(s): "PT", "2"   Discharge Medications:   Allergies as of 10/02/2022       Reactions   Other Nausea And Vomiting   All pain medications.  Can tolerate if anti-nausea medication first        Medication List     STOP taking these medications    doxycycline 100 MG tablet Commonly known as: VIBRA-TABS   traMADol 50 MG tablet Commonly known as: Ultram       TAKE these medications    Acetaminophen Extra Strength 500 MG Tabs Commonly known as: TYLENOL Take 1 tablet (500 mg total) by mouth every 6 (six) hours as needed for moderate pain.   Align 4 MG Caps Take 1 capsule by mouth 2 (two) times daily.   aspirin 81 MG chewable tablet Chew 1 tablet (81 mg total) by mouth 2 (two) times daily for 28 days.   Fish Oil 1000 MG Caps Take 1 capsule by mouth daily.   ICAPS LUTEIN-ZEAXANTHIN PO Take 1 tablet by mouth daily.    losartan 50 MG tablet Commonly known as: COZAAR Take 50 mg by mouth daily. On Hold for Surgery per patient   magnesium oxide 400 MG tablet Commonly known as: MAG-OX Take 400 mg by mouth daily.   methocarbamol 500 MG tablet Commonly known as: ROBAXIN Take 1 tablet (500 mg total) by mouth every 6 (six) hours as needed for muscle spasms.   MULTI VITAMIN DAILY PO Take 1 tablet by mouth daily.   ondansetron 8 MG disintegrating tablet Commonly known as: ZOFRAN-ODT Take 8 mg by mouth every 8 (eight) hours as needed for vomiting or nausea.   oxyCODONE 5 MG immediate release tablet Commonly known as: Oxy IR/ROXICODONE Take 1 tablet (5 mg total) by mouth every 4 (four) hours as needed for severe pain.   penicillin G  IVPB Inject 20 Million Units into the vein daily. Indication:  Group G Strep PJI First Dose: Yes Last Day of Therapy:  11/11/22 Labs - Once weekly:  CBC/D and BMP, ESR and CRP Method of administration: Elastomeric (Continuous infusion) Pull PICC line at the completion of IV antibiotics Method of administration may be changed at the discretion of home infusion pharmacist based upon assessment of the patient and/or caregiver's ability to self-administer the medication ordered.   polyethylene glycol 17 g packet Commonly known as: MIRALAX / GLYCOLAX Take 17 g by mouth 2 (two) times daily.   rosuvastatin 5 MG tablet Commonly known as: CRESTOR Take 5 mg by mouth at bedtime.   tamsulosin 0.4 MG Caps capsule Commonly known as: FLOMAX Take 0.4 mg by mouth at bedtime.   Turmeric 500 MG Caps Take 1,000 mg by mouth daily.   vitamin C 1000 MG tablet Take 1,000 mg by mouth daily.   Vitamin D3 50 MCG (2000 UT) Tabs Take 1 tablet by mouth daily.       ASK your doctor about these medications    senna 8.6 MG Tabs tablet Commonly known as: SENOKOT Take 2 tablets (17.2 mg total) by mouth at bedtime for 14 days. Ask about: Should I take this medication?                Discharge Care Instructions  (From admission, onward)           Start     Ordered   10/01/22 0000  Change dressing on IV access line weekly and PRN  (Home infusion instructions - [facility] )        10/01/22 1438            Diagnostic Studies: [place] EKG SITE RITE  Result Date: 09/30/2022 If Site Rite image not attached, placement  could not be confirmed due to current cardiac rhythm.  CT Knee Left Wo Contrast  Result Date: 09/17/2022 CLINICAL DATA:  Knee replacement, periprosthetic fracture suspected EXAM: CT OF THE LEFT KNEE WITHOUT CONTRAST TECHNIQUE: Multidetector CT imaging of the left knee was performed according to the standard protocol. Multiplanar CT image reconstructions were also generated. RADIATION DOSE REDUCTION: This exam was performed according to the departmental dose-optimization program which includes automated exposure control, adjustment of the mA and/or kV according to patient size and/or use of iterative reconstruction technique. COMPARISON:  None Available. FINDINGS: Bones/Joint/Cartilage Surgical changes of left total knee arthroplasty are identified. Normal alignment. No acute fracture or dislocation. No periprosthetic lucency or hardware fracture identified. No osseous erosions. 16 x 12 mm intra-articular loose body noted within the suprapatellar recess. Ligaments Suboptimally assessed by CT. Muscles and Tendons Normal muscle bulk. Quadriceps tendon is intact. Patellar tendon is obscured partially by streak artifact. The visualized portion is unremarkable. Soft tissues Capsular calcifications are seen within the suprapatellar recess. Moderate left knee effusion. IMPRESSION: 1. Surgical changes of left total knee arthroplasty without evidence of fracture or hardware complication. 2. 16 x 12 mm intra-articular loose body within the suprapatellar recess. 3. Moderate left knee effusion. Electronically Signed   By: Helyn Numbers M.D.   On: 09/17/2022 00:41    DG Chest Portable 1 View  Result Date: 09/16/2022 CLINICAL DATA:  Syncope EXAM: PORTABLE CHEST 1 VIEW COMPARISON:  None Available. FINDINGS: Mild elevation of the left hemidiaphragm with left basilar linear atelectasis. Lungs are otherwise clear. No pneumothorax. Tiny left pleural effusion. Cardiac size within normal limits. Pulmonary vascularity is normal. No acute bone abnormality. IMPRESSION: 1. Mild elevation of the left hemidiaphragm with left basilar atelectasis. Tiny left pleural effusion. Electronically Signed   By: Helyn Numbers M.D.   On: 09/16/2022 19:32   DG Knee Left Port  Result Date: 09/16/2022 CLINICAL DATA:  Pain EXAM: PORTABLE LEFT KNEE - 2 VIEW COMPARISON:  None Available. FINDINGS: Portable x-rays demonstrates total knee arthroplasty with cemented femoral and tibial component. Patellar button. Expected alignment. No hardware failure. Osteopenia. Soft tissue calcifications are seen surrounding the knee. No joint effusion. IMPRESSION: Surgical changes of total knee arthroplasty. Electronically Signed   By: Karen Kays M.D.   On: 09/16/2022 17:58    Disposition: Discharge disposition: 01-Home or Self Care       Discharge Instructions     [facility] pharmacist to adjust dose for Vancomycin, Aminoglycosides and other anti-infective therapies as requested by physician.   Complete by: As directed    [facility] to provide Cath Flo 2mg    Complete by: As directed    Administer for PICC line occlusion and as ordered by physician for other access device issues.   Anaphylaxis Kit: Provided to treat any anaphylactic reaction to the medication being provided to the patient if First Dose or when requested by physician   Complete by: As directed    Epinephrine 1mg /ml vial / amp: Administer 0.3mg  (0.89ml) subcutaneously once for moderate to severe anaphylaxis, nurse to call physician and pharmacy when reaction occurs and call 911 if needed for immediate care    Diphenhydramine 50mg /ml IV vial: Administer 25-50mg  IV/IM PRN for first dose reaction, rash, itching, mild reaction, nurse to call physician and pharmacy when reaction occurs   Sodium Chloride 0.9% NS IV: Administer if needed for hypovolemic blood pressure drop or as ordered by physician after call to physician with anaphylactic reaction   Change dressing  on IV access line weekly and PRN   Complete by: As directed    Flush IV access with Sodium Chloride 0.9% and Heparin 10 units/ml or 100 units/ml   Complete by: As directed    Home infusion instructions - [facility]   Complete by: As directed    Instructions: Flush IV access with Sodium Chloride 0.9% and Heparin 10units/ml or 100units/ml   Change dressing on IV access line: Weekly and PRN   Instructions Cath Flo 2mg : Administer for PICC Line occlusion and as ordered by physician for other access device   [facility] pharmacist to adjust dose for: Vancomycin, Aminoglycosides and other anti-infective therapies as requested by physician   Method of administration may be changed at the discretion of home infusion pharmacist based upon assessment of the patient and/or caregiver's ability to self-administer the medication ordered   Complete by: As directed         Follow-up Information     Durene Romans, MD. Schedule an appointment as soon as possible for a visit in 2 week(s).   Specialty: Orthopedic Surgery Contact information: [address] [place] [address] [phone]         [facility] Follow up.   Why: [facility] will provide IV antibiotics in the home after discharge.        [facility] Follow up.   Specialty: Home Health Services Why: [facility] will provide weekly nursing for labs and PICC line maintenance. Contact information: [address] [place] [address] [phone]                  Signed: Cassandria Anger 10/16/2022, 9:40 AM

## 2022-10-21 ENCOUNTER — Ambulatory Visit: Payer: Medicare Other | Admitting: Infectious Diseases

## 2022-10-21 ENCOUNTER — Encounter: Payer: Self-pay | Admitting: Infectious Diseases

## 2022-10-21 ENCOUNTER — Other Ambulatory Visit: Payer: Self-pay

## 2022-10-21 VITALS — BP 118/74 | HR 87 | Temp 98.1°F | Resp 16

## 2022-10-21 DIAGNOSIS — Z452 Encounter for adjustment and management of vascular access device: Secondary | ICD-10-CM | POA: Insufficient documentation

## 2022-10-21 DIAGNOSIS — Z79899 Other long term (current) drug therapy: Secondary | ICD-10-CM

## 2022-10-21 DIAGNOSIS — T8454XD Infection and inflammatory reaction due to internal left knee prosthesis, subsequent encounter: Secondary | ICD-10-CM

## 2022-10-21 NOTE — Progress Notes (Signed)
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 20 tablet 0   Ascorbic Acid (VITAMIN C) 1000 MG tablet Take 1,000 mg by mouth daily.     aspirin 81 MG chewable tablet Chew 1 tablet (81 mg total) by mouth 2 (two) times daily for 28 days. 56 tablet 0   Cholecalciferol (VITAMIN D3) 50 MCG (2000 UT) TABS Take 1 tablet by mouth daily.     magnesium oxide (MAG-OX) 400 MG tablet Take 400 mg by mouth daily.     Multiple Vitamin (MULTI VITAMIN DAILY PO) Take 1 tablet by mouth daily.      Omega-3 Fatty Acids (FISH OIL) 1000 MG CAPS Take 1 capsule by mouth daily.     ondansetron (ZOFRAN-ODT) 8 MG disintegrating tablet Take 8 mg by mouth every 8 (eight) hours as needed for vomiting or nausea.     penicillin G IVPB Inject 20 Million Units into the vein daily. Indication:  Group G Strep PJI First Dose: Yes Last Day of Therapy:  11/11/22 Labs - Once weekly:  CBC/D and BMP, ESR and CRP Method of  administration: Elastomeric (Continuous infusion) Pull PICC line at the completion of IV antibiotics Method of administration may be changed at the discretion of home infusion pharmacist based upon assessment of the patient and/or caregiver's ability to self-administer the medication ordered. 41 Units 0   polyethylene glycol (MIRALAX / GLYCOLAX) 17 g packet Take 17 g by mouth 2 (two) times daily. 14 each 0   Probiotic Product (ALIGN) 4 MG CAPS Take 1 capsule by mouth 2 (two) times daily.     rosuvastatin (CRESTOR) 5 MG tablet Take 5 mg by mouth at bedtime.     Specialty Vitamins Products (ICAPS LUTEIN-ZEAXANTHIN PO) Take 1 tablet by mouth daily.     Turmeric 500 MG CAPS Take 1,000 mg by mouth daily.     losartan (COZAAR) 50 MG tablet Take 50 mg by mouth daily. On Hold for Surgery per patient (Patient not taking: Reported on 10/21/2022)     tamsulosin (FLOMAX) 0.4 MG CAPS capsule Take 0.4 mg by mouth at bedtime. (Patient not taking: Reported on 10/21/2022)     No current facility-administered medications on file prior to visit.    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 Discharged on 5/9 to complete 6 weeks course of IV pen G, EOT 11/11/22  10/21/22 Accompanied by wife.  Reports getting IV pen G from rt arm PICC without any concerns related to abtx. Reports picc being pulled out 3-4 cms but no issues with IV infusion or blood draws. Denies pain, tenderness, swelling at PICC site, fevers or chills. He jas increased frequency of urination which wife thinks is related to BPH. Denies any urinary burning or dysuria. No concerns at Left knee. Saw ortho PA last week with no concerns reported.  Review of Systems: all systems reviewed with pertinent positives and negatives as listed above  Past Medical History:  Diagnosis Date   Adenomatous colon polyp    Anemia     Arthritis    Basal cell carcinoma    BPH (benign prostatic hyperplasia)    Cataract of left eye    due to trauma in left eye   Diverticulosis    in sigmoid colon and in the descending colon   Hemorrhoids    Hypercholesterolemia    Hypertension    Iron deficiency    anemia   Lichen planus    of tongue   Osteoarthritis    PONV (postoperative nausea and vomiting)    Rectal prolapse    Shingles    right ophthalmic involvement   Tinnitus    Past Surgical History:  Procedure Laterality Date   COLONOSCOPY     2010 and 2020   EXCISIONAL TOTAL KNEE ARTHROPLASTY WITH ANTIBIOTIC SPACERS Left 09/30/2022   Procedure: EXCISIONAL TOTAL KNEE ARTHROPLASTY WITH ANTIBIOTIC SPACERS;  Surgeon: Durene Romans, MD;  Location: [facility];  Service: Orthopedics;  Laterality: Left;   INGUINAL HERNIA REPAIR Bilateral    KNEE SURGERY Bilateral    arthrosocopy    removal of cyst     sphenoid cyst   intranasal sphenoidectomy   ROTATOR CUFF REPAIR Bilateral    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   Procedure: LEFT TOTAL KNEE ARTHROPLASTY;  Surgeon: Durene Romans, MD;  Location: [facility];  Service: Orthopedics;  Laterality: Left;  70 mins with block    Social History   Tobacco Use   Smoking status: Never   Smokeless tobacco: Never  Vaping Use   Vaping Use: Never used  Substance Use Topics   Alcohol use: Yes    Alcohol/week: 12.0 standard drinks of alcohol    Types: 12 Standard drinks or equivalent per week    Comment: 2 drinks per day   Drug use: No    Family History  Problem Relation Age of Onset   Tuberculosis Mother    Hypertension Father    Heart disease Father    AAA (abdominal aortic aneurysm) Father    Obesity Brother    Colon cancer Neg Hx    Esophageal cancer Neg Hx    Pancreatic cancer Neg Hx    Stomach cancer Neg Hx    Liver disease Neg Hx     Allergies  Allergen Reactions   Other Nausea And Vomiting    All pain medications.  Can tolerate if anti-nausea medication  first     Health Maintenance  Topic Date Due   Hepatitis C Screening  Never done   Pneumonia Vaccine 67+ Years old (2 of 2 - PPSV23 or PCV20) 01/22/2016   Zoster Vaccines- Shingrix (2 of 2) 04/14/2017   Medicare Annual Wellness (AWV)  12/21/2019   DTaP/Tdap/Td (2 - Td or Tdap) 01/26/2021   COVID-19 Vaccine (3 - 2023-24 season) 01/24/2022   INFLUENZA VACCINE  12/25/2022   HPV VACCINES  Aged Out   Colonoscopy  Discontinued  Objective:  Vitals:   10/21/22 1542  BP: 118/74  Pulse: 87  Resp: 16  Temp: 98.1 F (36.7 C)  TempSrc: Temporal  SpO2: 96%   There is no height or weight on file to calculate BMI.  Physical Exam Constitutional:      Appearance: Normal appearance.  HENT:     Head: Normocephalic and atraumatic.      Mouth: Mucous membranes are moist.  Eyes:    Conjunctiva/sclera: Conjunctivae normal.     Pupils: Pupils are equal, round, and bilaterally symmetrical   Cardiovascular:     Rate and Rhythm: Normal rate and regular rhythm.     Heart sounds: s1s2  Pulmonary:     Effort: Pulmonary effort is normal.     Breath sounds: Normal breath sounds.   Abdominal:     General: Non distended     Palpations: soft.   Musculoskeletal:        General: left knee wrapped in a bandage inside knee immobilizer, no surrounding cellulitis.   Skin:    General: Skin is warm and dry.     Comments: Rt arm picc with no concerns noted   Neurological:     General: grossly non focal     Mental Status: awake, alert and oriented to person, place, and time.   Psychiatric:        Mood and Affect: Mood normal.   Lab Results Lab Results  Component Value Date   WBC 10.5 10/02/2022   HGB 8.9 (L) 10/02/2022   HCT 26.7 (L) 10/02/2022   MCV 96.7 10/02/2022   PLT 568 (H) 10/02/2022    Lab Results  Component Value Date   CREATININE 0.99 10/01/2022   BUN 16 10/01/2022   NA 133 (L) 10/01/2022   K 4.3 10/01/2022   CL 105 10/01/2022   CO2 22 10/01/2022    Lab Results   Component Value Date   ALT 21 09/17/2022   AST 34 09/17/2022   ALKPHOS 35 (L) 09/17/2022   BILITOT 0.9 09/17/2022    No results found for: "CHOL", "HDL", "LDLCALC", "LDLDIRECT", "TRIG", "CHOLHDL" No results found for: "LABRPR", "RPRTITER" No results found for: "HIV1RNAQUANT", "HIV1RNAVL", "CD4TABS"   Assessment/Plan 82 year old male with PMH as below including basal cell carcinoma, BPH, HLD, HTN, right eye shingles, left total knee arthroplasty 03/03/2017 who was directly admitted  for    # Left knee PJI  4/23 left knee aspiration: Turbid, unable to calculate WBC count, neutrophilic intracellular calcium pyrophosphate, Cx Group G strep  5/7 s/p resection of infected left TKA and placement of an antibiotic articulating spacer.  OR note with mention of purulent fluid. No OR cultures sent.   Plan  Complete 6 weeks of IV pen G as in OPAT, EOT 11/11/22 Fu in 3 weeks, prior to EOT Fu with Ortho as instructed  # Medication management  5/21 ESR 47, CRP 15 , CBC and BMP unremarkable  # PICC  - no issues.   I have personally spent 45  minutes involved in face-to-face and non-face-to-face activities for this patient on the day of the visit. Professional time spent includes the following activities: Preparing to see the patient (review of tests), Obtaining and/or reviewing separately obtained history (admission/discharge record), Performing a medically appropriate examination and/or evaluation , Ordering medications/tests/procedures, referring and communicating with other health care professionals, Documenting clinical information in the EMR, Independently interpreting results (not separately reported), Communicating results to the patient/family/caregiver, Counseling and educating the patient/family/caregiver and Care coordination (not separately reported).  Victoriano Lain, MD [facility] 10/21/2022, 3:55 PM

## 2022-10-27 ENCOUNTER — Telehealth: Payer: Self-pay

## 2022-10-27 NOTE — Telephone Encounter (Signed)
Pam called back, patient's OPAT was only approved for 3 weeks of therapy. Authorization for the next three weeks is still pending and labs may have to be drawn Wednesday instead of Tuesday this week.   Sandie Ano, RN

## 2022-10-27 NOTE — Telephone Encounter (Signed)
Received call from [facility] with [facility] stating that patient is due to have labs drawn tomorrow, but that she is still waiting on insurance authorization. States that if she does not get authorization by today she will need a new order to draw labs on a different day.   Spoke with Jeri Modena, RN with [employer] who will look into this as OPAT should already be authorized by insurance.   Deanna Artis: [phone]  Sandie Ano, RN

## 2022-10-28 NOTE — Telephone Encounter (Signed)
Per Jeri Modena, RN with [facility], insurance has approved OPAT and home visit was completed today.   Sandie Ano, RN

## 2022-11-10 ENCOUNTER — Telehealth: Payer: Self-pay

## 2022-11-10 NOTE — Telephone Encounter (Addendum)
Patient called to let our office know his picc line came out last night at home. EOT 11/11/22. Patient is scheduled to see you tomorrow. I have updated [employer] as well.  Charles Norton T Pricilla Loveless

## 2022-11-11 ENCOUNTER — Other Ambulatory Visit: Payer: Self-pay

## 2022-11-11 ENCOUNTER — Ambulatory Visit: Payer: Medicare Other | Admitting: Infectious Diseases

## 2022-11-11 ENCOUNTER — Encounter: Payer: Self-pay | Admitting: Infectious Diseases

## 2022-11-11 VITALS — BP 125/76 | HR 82 | Temp 97.4°F | Ht 68.0 in | Wt 145.0 lb

## 2022-11-11 DIAGNOSIS — Z452 Encounter for adjustment and management of vascular access device: Secondary | ICD-10-CM | POA: Insufficient documentation

## 2022-11-11 DIAGNOSIS — Z79899 Other long term (current) drug therapy: Secondary | ICD-10-CM | POA: Diagnosis not present

## 2022-11-11 DIAGNOSIS — T8454XD Infection and inflammatory reaction due to internal left knee prosthesis, subsequent encounter: Secondary | ICD-10-CM

## 2022-11-11 NOTE — Progress Notes (Signed)
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 20 tablet 0   Ascorbic Acid (VITAMIN C) 1000 MG tablet Take 1,000 mg by mouth daily.     Cholecalciferol (VITAMIN D3) 50 MCG (2000 UT) TABS Take 1 tablet by mouth daily.     magnesium oxide (MAG-OX) 400 MG tablet Take 400 mg by mouth daily.     Multiple Vitamin (MULTI VITAMIN DAILY PO) Take 1 tablet by mouth daily.      Omega-3 Fatty Acids (FISH OIL) 1000 MG CAPS Take 1 capsule by mouth daily.     ondansetron (ZOFRAN-ODT) 8 MG disintegrating tablet Take 8 mg by mouth every 8 (eight) hours as needed for vomiting or nausea.     polyethylene glycol (MIRALAX / GLYCOLAX) 17 g packet Take 17 g by mouth 2 (two) times daily. 14 each 0   Probiotic Product (ALIGN) 4 MG CAPS Take 1 capsule by mouth 2 (two) times daily.     rosuvastatin (CRESTOR) 5  MG tablet Take 5 mg by mouth at bedtime.     Specialty Vitamins Products (ICAPS LUTEIN-ZEAXANTHIN PO) Take 1 tablet by mouth daily.     Turmeric 500 MG CAPS Take 1,000 mg by mouth daily.     losartan (COZAAR) 50 MG tablet Take 50 mg by mouth daily. On Hold for Surgery per patient (Patient not taking: Reported on 10/21/2022)     penicillin G IVPB Inject 20 Million Units into the vein daily. Indication:  Group G Strep PJI First Dose: Yes Last Day of Therapy:  11/11/22 Labs - Once weekly:  CBC/D and BMP, ESR and CRP Method of administration: Elastomeric (Continuous infusion) Pull PICC line at the completion of IV antibiotics Method of administration may be changed at the discretion of home infusion pharmacist based upon assessment of the patient and/or caregiver's ability to self-administer the medication ordered. (Patient not taking: Reported on 11/11/2022) 41 Units 0   tamsulosin (FLOMAX) 0.4 MG CAPS capsule Take 0.4 mg by mouth at bedtime. (Patient not taking: Reported on 10/21/2022)     No current facility-administered medications on file prior to visit.    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 Discharged on 5/9 to complete 6 weeks course of IV pen G, EOT 11/11/22  Getting IV pen G until yesterday when PICC  came out. Denies any concerns with the abtx. Left knee is stable with no concerns. Denies fevers, chills. Denies nausea, vomiting and diarrhea. Reports he has an upcoming appt with Ortho and plan for re-implantation in a month   Review of Systems: all systems reviewed with pertinent positives and negatives as listed above  Past Medical History:  Diagnosis Date   Adenomatous colon polyp    Anemia    Arthritis    Basal cell carcinoma    BPH (benign prostatic hyperplasia)    Cataract of left eye    due to trauma in left eye   Diverticulosis     in sigmoid colon and in the descending colon   Hemorrhoids    Hypercholesterolemia    Hypertension    Iron deficiency    anemia   Lichen planus    of tongue   Osteoarthritis    PONV (postoperative nausea and vomiting)    Rectal prolapse    Shingles    right ophthalmic involvement   Tinnitus    Past Surgical History:  Procedure Laterality Date   COLONOSCOPY     2010 and 2020   EXCISIONAL TOTAL KNEE ARTHROPLASTY WITH ANTIBIOTIC SPACERS Left 09/30/2022   Procedure: EXCISIONAL TOTAL KNEE ARTHROPLASTY WITH ANTIBIOTIC SPACERS;  Surgeon: Durene Romans, MD;  Location: [facility];  Service: Orthopedics;  Laterality: Left;   INGUINAL HERNIA REPAIR Bilateral    KNEE SURGERY Bilateral    arthrosocopy    removal of cyst     sphenoid cyst   intranasal sphenoidectomy   ROTATOR CUFF REPAIR Bilateral    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   Procedure: LEFT TOTAL KNEE ARTHROPLASTY;  Surgeon: Durene Romans, MD;  Location: [facility];  Service: Orthopedics;  Laterality: Left;  70 mins with block    Social History   Tobacco Use   Smoking status: Never   Smokeless tobacco: Never  Vaping Use   Vaping Use: Never used  Substance Use Topics   Alcohol use: Yes    Alcohol/week: 12.0 standard drinks of alcohol    Types: 12 Standard drinks or equivalent per week    Comment: 2 drinks per day   Drug use: No    Family History  Problem Relation Age of Onset   Tuberculosis Mother    Hypertension Father    Heart disease Father    AAA (abdominal aortic aneurysm) Father    Obesity Brother    Colon cancer Neg Hx    Esophageal cancer Neg Hx    Pancreatic cancer Neg Hx    Stomach cancer Neg Hx    Liver disease Neg Hx     Allergies  Allergen Reactions   Other Nausea And Vomiting    All pain medications.  Can tolerate if anti-nausea medication first     Health Maintenance  Topic Date Due   Hepatitis C Screening  Never done   Pneumonia Vaccine 33+ Years old (2 of 2 - PPSV23 or PCV20) 01/22/2016    Zoster Vaccines- Shingrix (2 of 2) 04/14/2017   COVID-19 Vaccine (3 - Moderna risk series) 08/03/2019   Medicare Annual Wellness (AWV)  12/21/2019   DTaP/Tdap/Td (2 - Td or Tdap) 01/26/2021   INFLUENZA VACCINE  12/25/2022   HPV VACCINES  Aged Out   Colonoscopy  Discontinued    Objective:  Vitals:   11/11/22 1028  BP: 125/76  Pulse: 82  Temp: (!) 97.4 F (36.3 C)  TempSrc: Temporal  SpO2: 97%  Weight: 145 lb (65.8 kg)  Height: 5\' 8"  (1.727 m)   Body mass index is 22.05 kg/m.  Physical Exam Constitutional:      Appearance: Normal appearance.  HENT:     Head: Normocephalic and atraumatic.      Mouth: Mucous membranes are moist.  Eyes:    Conjunctiva/sclera: Conjunctivae normal.     Pupils: Pupils are equal, round, and bilaterally symmetrical   Cardiovascular:     Rate and Rhythm: Normal rate and regular rhythm.     Heart sounds: s1s2  Pulmonary:     Effort: Pulmonary effort is normal.     Breath sounds: Normal breath sounds.   Abdominal:     General: Non distended     Palpations: soft.   Musculoskeletal:        General: left knee wrapped in a bandage inside knee immobilizer, mild swelling as expected in post op knee but no erythema, hot or tender, no surrounding cellulitis. Surgical site has healed. Ambulatory with cane outside and walker at home  Skin:    General: Skin is warm and dry.     Comments: Rt arm picc with no concerns noted   Neurological:     General: grossly non focal     Mental Status: awake, alert and oriented to person, place, and time.   Psychiatric:        Mood and Affect: Mood normal.   Lab Results Lab Results  Component Value Date   WBC 10.5 10/02/2022   HGB 8.9 (L) 10/02/2022   HCT 26.7 (L) 10/02/2022   MCV 96.7 10/02/2022   PLT 568 (H) 10/02/2022    Lab Results  Component Value Date   CREATININE 0.99 10/01/2022   BUN 16 10/01/2022   NA 133 (L) 10/01/2022   K 4.3 10/01/2022   CL 105 10/01/2022   CO2 22 10/01/2022     Lab Results  Component Value Date   ALT 21 09/17/2022   AST 34 09/17/2022   ALKPHOS 35 (L) 09/17/2022   BILITOT 0.9 09/17/2022    No results found for: "CHOL", "HDL", "LDLCALC", "LDLDIRECT", "TRIG", "CHOLHDL" No results found for: "LABRPR", "RPRTITER" No results found for: "HIV1RNAQUANT", "HIV1RNAVL", "CD4TABS"   Assessment/Plan 82 year old male with PMH as below including basal cell carcinoma, BPH, HLD, HTN, right eye shingles, left total knee arthroplasty 03/03/2017 who was directly admitted  for    # Left knee PJI  4/23 left knee aspiration: Turbid, unable to calculate WBC count, neutrophilic intracellular calcium pyrophosphate, Cx Group G strep  5/7 s/p resection of infected left TKA and placement of an antibiotic articulating spacer.  OR note with mention of purulent fluid. No OR cultures sent.  Completed approx  6 weeks of IV pen G  Will need at least 2 weeks off abtx prior to consideration of re-implantation Fu with Orthopedics as instructed  Fu in 6 weeks   # PICC Already out, site looks OK  # Medication management  6/4 ESR 11, CRP 2, CBC and BMP unremarkable   I have personally spent 31 minutes involved in face-to-face and non-face-to-face activities for this patient on the day of the visit. Professional time spent includes the following activities: Preparing to see the patient (review of tests), Obtaining and/or reviewing separately obtained history (admission/discharge record), Performing a medically appropriate examination and/or evaluation , Ordering medications/tests/procedures, referring and communicating with other health care professionals, Documenting clinical information in the EMR, Independently interpreting results (not separately reported), Communicating results to the patient/family/caregiver, Counseling and educating the patient/family/caregiver and Care coordination (  not separately reported).   Victoriano Lain, MD [facility] 11/11/2022, 10:34 AM

## 2022-11-11 NOTE — Patient Instructions (Signed)
Group G streptococcus is the name of bacteria causing Left knee Prosthetic joint infection as you asked

## 2022-11-12 ENCOUNTER — Telehealth: Payer: Self-pay

## 2022-11-12 NOTE — Telephone Encounter (Signed)
Received call from [facility] nurse, Myrica, with [facility] wanting to know if patient still needed [facility] labs. Relayed that since he is done with IV OPAT she does not need to collect labs any longer.   Sandie Ano, RN

## 2022-12-01 NOTE — Patient Instructions (Signed)
DUE TO COVID-19 ONLY TWO VISITORS  (aged 80 and older)  ARE ALLOWED TO COME WITH YOU AND STAY IN THE WAITING ROOM ONLY DURING PRE OP AND PROCEDURE.   **NO VISITORS ARE ALLOWED IN THE SHORT STAY AREA OR RECOVERY ROOM!!**  IF YOU WILL BE ADMITTED INTO THE HOSPITAL YOU ARE ALLOWED ONLY FOUR SUPPORT PEOPLE DURING VISITATION HOURS ONLY (7 AM -8PM)   The support person(s) must pass our screening, gel in and out, and wear a mask at all times, including in the patient's room. Patients must also wear a mask when staff or their support person are in the room. Visitors GUEST BADGE MUST BE WORN VISIBLY  One adult visitor may remain with you overnight and MUST be in the room by 8 P.M.     Your procedure is scheduled on: 12/11/22   Report to [facility] Main Entrance    Report to admitting at : 5:15 AM   Call this number if you have problems the morning of surgery [phone]   Do not eat food :After Midnight.   After Midnight you may have the following liquids until : 4:30 AM DAY OF SURGERY  Water Black Coffee (sugar ok, NO MILK/CREAM OR CREAMERS)  Tea (sugar ok, NO MILK/CREAM OR CREAMERS) regular and decaf                             Plain Jell-O (NO RED)                                           Fruit ices (not with fruit pulp, NO RED)                                     Popsicles (NO RED)                                                                  Juice: apple, WHITE grape, WHITE cranberry Sports drinks like Gatorade (NO RED)    The day of surgery:  Drink ONE (1) Pre-Surgery Clear Ensure at : 4:30 AM the morning of surgery. Drink in one sitting. Do not sip.  This drink was given to you during your hospital  pre-op appointment visit. Nothing else to drink after completing the  Pre-Surgery Clear Ensure or G2.          If you have questions, please contact your surgeon's office.   Oral Hygiene is also important to reduce your risk of infection.                                     Remember - BRUSH YOUR TEETH THE MORNING OF SURGERY WITH YOUR REGULAR TOOTHPASTE  DENTURES WILL BE REMOVED PRIOR TO SURGERY PLEASE DO NOT APPLY "Poly grip" OR ADHESIVES!!!   Do NOT smoke after Midnight   Take these medicines the morning of surgery with A SIP OF WATER: N/A. Take Tylenol as needed.  You may not have any metal on your body including hair pins, jewelry, and body piercing             Do not wear lotions, powders, perfumes/cologne, or deodorant              Men may shave face and neck.   Do not bring valuables to the hospital. [facility] IS NOT             RESPONSIBLE   FOR VALUABLES.   Contacts, glasses, or bridgework may not be worn into surgery.   Bring small overnight bag day of surgery.   DO NOT BRING YOUR HOME MEDICATIONS TO THE HOSPITAL. PHARMACY WILL DISPENSE MEDICATIONS LISTED ON YOUR MEDICATION LIST TO YOU DURING YOUR ADMISSION IN THE HOSPITAL!    Patients discharged on the day of surgery will not be allowed to drive home.  Someone NEEDS to stay with you for the first 24 hours after anesthesia.   Special Instructions: Bring a copy of your healthcare power of attorney and living will documents         the day of surgery if you haven't scanned them before.              Please read over the following fact sheets you were given: IF YOU HAVE QUESTIONS ABOUT YOUR PRE-OP INSTRUCTIONS PLEASE CALL [phone]      Pre-operative 5 CHG Bath Instructions   You can play a key role in reducing the risk of infection after surgery. Your skin needs to be as free of germs as possible. You can reduce the number of germs on your skin by washing with CHG (chlorhexidine gluconate) soap before surgery. CHG is an antiseptic soap that kills germs and continues to kill germs even after washing.   DO NOT use if you have an allergy to chlorhexidine/CHG or antibacterial soaps. If your skin becomes reddened or irritated, stop using the CHG and notify one of  our RNs at [phone].   Please shower with the CHG soap starting 4 days before surgery using the following schedule:     Please keep in mind the following:  DO NOT shave, including legs and underarms, starting the day of your first shower.   You may shave your face at any point before/day of surgery.  Place clean sheets on your bed the day you start using CHG soap. Use a clean washcloth (not used since being washed) for each shower. DO NOT sleep with pets once you start using the CHG.   CHG Shower Instructions:  If you choose to wash your hair and private area, wash first with your normal shampoo/soap.  After you use shampoo/soap, rinse your hair and body thoroughly to remove shampoo/soap residue.  Turn the water OFF and apply about 3 tablespoons (45 ml) of CHG soap to a CLEAN washcloth.  Apply CHG soap ONLY FROM YOUR NECK DOWN TO YOUR TOES (washing for 3-5 minutes)  DO NOT use CHG soap on face, private areas, open wounds, or sores.  Pay special attention to the area where your surgery is being performed.  If you are having back surgery, having someone wash your back for you may be helpful. Wait 2 minutes after CHG soap is applied, then you may rinse off the CHG soap.  Pat dry with a clean towel  Put on clean clothes/pajamas   If you choose to wear lotion, please use ONLY the CHG-compatible lotions on the back of this paper.  Additional instructions for the day of surgery: DO NOT APPLY any lotions, deodorants, cologne, or perfumes.   Put on clean/comfortable clothes.  Brush your teeth.  Ask your nurse before applying any prescription medications to the skin.      CHG Compatible Lotions   Aveeno Moisturizing lotion  Cetaphil Moisturizing Cream  Cetaphil Moisturizing Lotion  Clairol Herbal Essence Moisturizing Lotion, Dry Skin  Clairol Herbal Essence Moisturizing Lotion, Extra Dry Skin  Clairol Herbal Essence Moisturizing Lotion, Normal Skin  Curel Age Defying  Therapeutic Moisturizing Lotion with Alpha Hydroxy  Curel Extreme Care Body Lotion  Curel Soothing Hands Moisturizing Hand Lotion  Curel Therapeutic Moisturizing Cream, Fragrance-Free  Curel Therapeutic Moisturizing Lotion, Fragrance-Free  Curel Therapeutic Moisturizing Lotion, Original Formula  Eucerin Daily Replenishing Lotion  Eucerin Dry Skin Therapy Plus Alpha Hydroxy Crme  Eucerin Dry Skin Therapy Plus Alpha Hydroxy Lotion  Eucerin Original Crme  Eucerin Original Lotion  Eucerin Plus Crme Eucerin Plus Lotion  Eucerin TriLipid Replenishing Lotion  Keri Anti-Bacterial Hand Lotion  Keri Deep Conditioning Original Lotion Dry Skin Formula Softly Scented  Keri Deep Conditioning Original Lotion, Fragrance Free Sensitive Skin Formula  Keri Lotion Fast Absorbing Fragrance Free Sensitive Skin Formula  Keri Lotion Fast Absorbing Softly Scented Dry Skin Formula  Keri Original Lotion  Keri Skin Renewal Lotion Keri Silky Smooth Lotion  Keri Silky Smooth Sensitive Skin Lotion  Nivea Body Creamy Conditioning Oil  Nivea Body Extra Enriched Lotion  Nivea Body Original Lotion  Nivea Body Sheer Moisturizing Lotion Nivea Crme  Nivea Skin Firming Lotion  NutraDerm 30 Skin Lotion  NutraDerm Skin Lotion  NutraDerm Therapeutic Skin Cream  NutraDerm Therapeutic Skin Lotion  ProShield Protective Hand Cream  Provon moisturizing lotion   Incentive Spirometer  An incentive spirometer is a tool that can help keep your lungs clear and active. This tool measures how well you are filling your lungs with each breath. Taking long deep breaths may help reverse or decrease the chance of developing breathing (pulmonary) problems (especially infection) following: A long period of time when you are unable to move or be active. BEFORE THE PROCEDURE  If the spirometer includes an indicator to show your best effort, your nurse or respiratory therapist will set it to a desired goal. If possible, sit up straight  or lean slightly forward. Try not to slouch. Hold the incentive spirometer in an upright position. INSTRUCTIONS FOR USE  Sit on the edge of your bed if possible, or sit up as far as you can in bed or on a chair. Hold the incentive spirometer in an upright position. Breathe out normally. Place the mouthpiece in your mouth and seal your lips tightly around it. Breathe in slowly and as deeply as possible, raising the piston or the ball toward the top of the column. Hold your breath for 3-5 seconds or for as long as possible. Allow the piston or ball to fall to the bottom of the column. Remove the mouthpiece from your mouth and breathe out normally. Rest for a few seconds and repeat Steps 1 through 7 at least 10 times every 1-2 hours when you are awake. Take your time and take a few normal breaths between deep breaths. The spirometer may include an indicator to show your best effort. Use the indicator as a goal to work toward during each repetition. After each set of 10 deep breaths, practice coughing to be sure your lungs are clear. If you have an incision (the cut made at the time  of surgery), support your incision when coughing by placing a pillow or rolled up towels firmly against it. Once you are able to get out of bed, walk around indoors and cough well. You may stop using the incentive spirometer when instructed by your caregiver.  RISKS AND COMPLICATIONS Take your time so you do not get dizzy or light-headed. If you are in pain, you may need to take or ask for pain medication before doing incentive spirometry. It is harder to take a deep breath if you are having pain. AFTER USE Rest and breathe slowly and easily. It can be helpful to keep track of a log of your progress. Your caregiver can provide you with a simple table to help with this. If you are using the spirometer at home, follow these instructions: SEEK MEDICAL CARE IF:  You are having difficultly using the spirometer. You have  trouble using the spirometer as often as instructed. Your pain medication is not giving enough relief while using the spirometer. You develop fever of 100.5 F (38.1 C) or higher. SEEK IMMEDIATE MEDICAL CARE IF:  You cough up bloody sputum that had not been present before. You develop fever of 102 F (38.9 C) or greater. You develop worsening pain at or near the incision site. MAKE SURE YOU:  Understand these instructions. Will watch your condition. Will get help right away if you are not doing well or get worse. Document Released: 09/22/2006 Document Revised: 08/04/2011 Document Reviewed: 11/23/2006 [facility] Patient Information 2014 [place], [place].   ________________________________________________________________________

## 2022-12-02 ENCOUNTER — Encounter (HOSPITAL_COMMUNITY)
Admission: RE | Admit: 2022-12-02 | Discharge: 2022-12-02 | Disposition: A | Discharge status: Home / Self Care | Payer: Medicare Other | Source: Ambulatory Visit | Attending: Orthopedic Surgery | Admitting: Orthopedic Surgery

## 2022-12-02 ENCOUNTER — Other Ambulatory Visit: Payer: Self-pay

## 2022-12-02 ENCOUNTER — Encounter (HOSPITAL_COMMUNITY): Payer: Self-pay

## 2022-12-02 VITALS — BP 135/81 | HR 77 | Temp 97.7°F | Ht 68.0 in | Wt 146.0 lb

## 2022-12-02 DIAGNOSIS — I10 Essential (primary) hypertension: Secondary | ICD-10-CM

## 2022-12-02 DIAGNOSIS — Z01818 Encounter for other preprocedural examination: Secondary | ICD-10-CM | POA: Insufficient documentation

## 2022-12-02 LAB — BASIC METABOLIC PANEL
Anion gap: 8 (ref 5–15)
BUN: 21 mg/dL (ref 8–23)
CO2: 24 mmol/L (ref 22–32)
Calcium: 9.2 mg/dL (ref 8.9–10.3)
Chloride: 108 mmol/L (ref 98–111)
Creatinine, Ser: 0.85 mg/dL (ref 0.61–1.24)
GFR, Estimated: >60 mL/min (ref >60)
Glucose, Bld: 103 mg/dL — ABNORMAL HIGH (ref 70–99)
Potassium: 4.4 mmol/L (ref 3.5–5.1)
Sodium: 140 mmol/L (ref 135–145)

## 2022-12-02 LAB — CBC
HCT: 38 % — ABNORMAL LOW (ref 39.0–52.0)
Hemoglobin: 12.2 g/dL — ABNORMAL LOW (ref 13.0–17.0)
MCH: 31.8 pg (ref 26.0–34.0)
MCHC: 32.1 g/dL (ref 30.0–36.0)
MCV: 99 fL (ref 80.0–100.0)
Platelets: 309 10*3/uL (ref 150–400)
RBC: 3.84 MIL/uL — ABNORMAL LOW (ref 4.22–5.81)
RDW: 13.5 % (ref 11.5–15.5)
WBC: 6.6 10*3/uL (ref 4.0–10.5)
nRBC: 0 % (ref 0.0–0.2)

## 2022-12-02 LAB — SURGICAL PCR SCREEN
MRSA, PCR: NEGATIVE
Staphylococcus aureus: NEGATIVE

## 2022-12-02 LAB — C-REACTIVE PROTEIN: CRP: 0.7 mg/dL (ref <1.0)

## 2022-12-02 LAB — SEDIMENTATION RATE: Sed Rate: 14 mm/hr (ref 0–16)

## 2022-12-02 NOTE — Progress Notes (Signed)
For Short Stay: COVID SWAB appointment date:  Bowel Prep reminder:   For Anesthesia: PCP - Horton Marshall: PA Cardiologist - N/A  Chest x-ray - 09/16/22 EKG - 09/16/22 Stress Test - 2018 ECHO -  Cardiac Cath -  Pacemaker/ICD device last checked: Pacemaker orders received: Device Rep notified:  Spinal Cord Stimulator: N/A  Sleep Study - N/A CPAP -   Fasting Blood Sugar - N/A Checks Blood Sugar _____ times a day Date and result of last Hgb A1c-  Last dose of GLP1 agonist- N/A GLP1 instructions:   Last dose of SGLT-2 inhibitors- N/A SGLT-2 instructions:   Blood Thinner Instructions: N/A Aspirin Instructions: Last Dose:  Activity level: Can go up a flight of stairs and activities of daily living without stopping and without chest pain and/or shortness of breath   Unable to exercise due to his left knee issues.   Hx: HTN Anesthesia review:   Patient denies shortness of breath, fever, cough and chest pain at PAT appointment   Patient verbalized understanding of instructions that were given to them at the PAT appointment. Patient was also instructed that they will need to review over the PAT instructions again at home before surgery.

## 2022-12-10 NOTE — H&P (Signed)
TOTAL KNEE REVISION ADMISSION H&P  Patient is being admitted for reimplantation of his left total knee  Notes for H&P: - Oxycodone - Ice machine at hospital - OPPT at [facility]  Subjective:  Chief Complaint:left knee pain.  HPI: ELDOR CONAWAY, 82 y.o. male. He has a history of left total knee arthroplasty by Dr. [name] in 2018. He did very well with his knee since this time, and plays tennis regularly. He was out playing tennis on 09/16/22, and reported pain that began at the end of his match. He went home, and felt dehydrated and weak and was taken to the ER by EMS. He incidentally mentioned his knee pain and the knee was aspirated and sent for culture. This revealed strep G. There was no recent infection, injuries, dental work, etc. He underwent resection with placement of antibiotic spacer.   His labs normalized, and we scheduled him for reimplantation.   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   Abnormal EKG 02/26/2017   Hyperlipidemia 02/26/2017   Elevated blood pressure reading in office without diagnosis of hypertension 02/26/2017   Preoperative cardiovascular examination 02/26/2017   HERPES SIMPLEX INFECTION, RECURRENT 08/26/2008   ANEMIA, IRON DEFICIENCY 08/26/2008   DIVERTICULOSIS, COLON, HX OF 08/26/2008   INGUINAL HERNIORRHAPHY, HX OF 07/12/2004   Past Medical History:  Diagnosis  Date   Adenomatous colon polyp    Anemia    Arthritis    Basal cell carcinoma    BPH (benign prostatic hyperplasia)    Cataract of left eye    due to trauma in left eye   Diverticulosis    in sigmoid colon and in the descending colon   Hemorrhoids    Hypercholesterolemia    Hypertension    Iron deficiency    anemia   Lichen planus    of tongue   Osteoarthritis    PONV (postoperative nausea and vomiting)    Rectal prolapse    Shingles    right ophthalmic involvement   Tinnitus     Past Surgical History:  Procedure Laterality Date   COLONOSCOPY     2010 and 2020   EXCISIONAL TOTAL KNEE ARTHROPLASTY WITH ANTIBIOTIC SPACERS Left 09/30/2022   Procedure: EXCISIONAL TOTAL KNEE ARTHROPLASTY WITH ANTIBIOTIC SPACERS;  Surgeon: Durene Romans, MD;  Location: [facility];  Service: Orthopedics;  Laterality: Left;   EYE SURGERY Bilateral    cataracts   INGUINAL HERNIA REPAIR Bilateral    KNEE SURGERY Bilateral    arthrosocopy    removal of cyst     sphenoid cyst   intranasal sphenoidectomy   ROTATOR CUFF REPAIR Bilateral    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   Procedure: LEFT TOTAL KNEE ARTHROPLASTY;  Surgeon: Durene Romans, MD;  Location: [facility];  Service: Orthopedics;  Laterality: Left;  70 mins with block    No current facility-administered medications for this encounter.   Current Outpatient Medications  Medication Sig Dispense Refill Last Dose  acetaminophen (TYLENOL) 500 MG tablet Take 1 tablet (500 mg total) by mouth every 6 (six) hours as needed for moderate pain. 20 tablet 0    Ascorbic Acid (VITAMIN C) 1000 MG tablet Take 1,000 mg by mouth in the morning.      carboxymethylcellulose (REFRESH PLUS) 0.5 % SOLN Place 1 drop into both eyes in the morning.      Cholecalciferol (VITAMIN D3) 50 MCG (2000 UT) TABS Take 2,000 Units by mouth in the morning.      magnesium oxide (MAG-OX) 400 MG tablet Take 400 mg by mouth in the morning.      Multiple Vitamin (MULTI VITAMIN DAILY  PO) Take 1 tablet by mouth in the morning.      Omega-3 Fatty Acids (FISH OIL) 1000 MG CAPS Take 1 capsule by mouth in the morning.      Probiotic Product (ALIGN) 4 MG CAPS Take 1 capsule by mouth in the morning and at bedtime. After lunch & after supper      rosuvastatin (CRESTOR) 5 MG tablet Take 5 mg by mouth at bedtime.      Specialty Vitamins Products (ICAPS LUTEIN-ZEAXANTHIN PO) Take 1 tablet by mouth in the morning.      Turmeric 500 MG CAPS Take 1,000 mg by mouth in the morning.      Wheat Dextrin (BENEFIBER PO) Take 1 Dose by mouth in the morning. Mixed with coffee      losartan (COZAAR) 50 MG tablet Take 50 mg by mouth daily. On Hold for Surgery per patient (Patient not taking: Reported on 10/21/2022)      Allergies  Allergen Reactions   Other Nausea And Vomiting    All pain medications.  Can tolerate if anti-nausea medication first     Social History   Tobacco Use   Smoking status: Never   Smokeless tobacco: Never  Substance Use Topics   Alcohol use: Not Currently    Comment: 1-2 drinks per day    Family History  Problem Relation Age of Onset   Tuberculosis Mother    Hypertension Father    Heart disease Father    AAA (abdominal aortic aneurysm) Father    Obesity Brother    Colon cancer Neg Hx    Esophageal cancer Neg Hx    Pancreatic cancer Neg Hx    Stomach cancer Neg Hx    Liver disease Neg Hx       Review of Systems  Constitutional:  Negative for chills and fever.  Respiratory:  Negative for cough and shortness of breath.   Cardiovascular:  Negative for chest pain.  Gastrointestinal:  Negative for nausea and vomiting.  Musculoskeletal:  Positive for arthralgias.      Objective:  Physical Exam Physical Exam Very pleasant 82 year old male seen in the office today. Upon examination he was laying on the exam table with a blanket. He is in no acute distress but is in obvious discomfort associated with pain in his left knee.   Left knee exam: His surgical  incision is intact however he does have erythema and warmth as well as a palpable swelling to the left knee He does have some swelling and prominence to the superior lateral aspect of the knee without obvious disruption of the skin. Range of motion not assessed He does have lower extremity edema with mild erythema  Vital signs in last 24 hours:    Labs:  Estimated body mass index is 22.2 kg/m as calculated from the following:  Height as of 12/02/22: 5\' 8"  (1.727 m).   Weight as of 12/02/22: 66.2 kg.  Imaging Review No interval x-rays   Assessment/Plan:  Status post left knee resection with placement of antibiotic spacer  The patient history, physical examination, clinical judgment of the provider and imaging studies are consistent with s/p left TKA resection previous total knee arthroplasty. Revision total knee arthroplasty is deemed medically necessary. The treatment options including medical management, injection therapy, arthroscopy and revision arthroplasty were discussed at length. The risks and benefits of revision total knee arthroplasty were presented and reviewed. The risks due to aseptic loosening, infection, stiffness, patella tracking problems, thromboembolic complications and other imponderables were discussed. The patient acknowledged the explanation, agreed to proceed with the plan and consent was signed. Patient is being admitted for inpatient treatment for surgery, pain control, PT, OT, prophylactic antibiotics, VTE prophylaxis, progressive ambulation and ADL's and discharge planning.The patient is planning to be discharged  home.  Rosalene Billings, PA-C Orthopedic Surgery [facility] [place] [phone]

## 2022-12-10 NOTE — Anesthesia Preprocedure Evaluation (Addendum)
Anesthesia Evaluation  Patient identified by MRN, date of birth, ID band Patient awake    Reviewed: Allergy & Precautions, NPO status , Patient's Chart, lab work & pertinent test results  History of Anesthesia Complications (+) PONV and history of anesthetic complications  Airway Mallampati: II  TM Distance: >3 FB Neck ROM: Full    Dental  (+) Teeth Intact, Dental Advisory Given   Pulmonary neg pulmonary ROS   Pulmonary exam normal breath sounds clear to auscultation       Cardiovascular hypertension, Pt. on medications Normal cardiovascular exam Rhythm:Regular Rate:Normal     Neuro/Psych negative neurological ROS     GI/Hepatic negative GI ROS, Neg liver ROS,,,  Endo/Other  negative endocrine ROS    Renal/GU negative Renal ROS     Musculoskeletal  (+) Arthritis ,  Status post resection of left total knee arthroplasty and removal of antibiotic spacer   Abdominal   Peds  Hematology  (+) Blood dyscrasia, anemia Plt 309k   Anesthesia Other Findings   Reproductive/Obstetrics                             Anesthesia Physical Anesthesia Plan  ASA: 3  Anesthesia Plan: Spinal   Post-op Pain Management: Regional block* and Tylenol PO (pre-op)*   Induction: Intravenous  PONV Risk Score and Plan: 2 and Dexamethasone, Ondansetron and TIVA  Airway Management Planned: Natural Airway and Simple Face Mask  Additional Equipment:   Intra-op Plan:   Post-operative Plan:   Informed Consent: I have reviewed the patients History and Physical, chart, labs and discussed the procedure including the risks, benefits and alternatives for the proposed anesthesia with the patient or authorized representative who has indicated his/her understanding and acceptance.     Dental advisory given  Plan Discussed with: CRNA  Anesthesia Plan Comments:         Anesthesia Quick Evaluation

## 2022-12-11 ENCOUNTER — Other Ambulatory Visit: Payer: Self-pay

## 2022-12-11 ENCOUNTER — Inpatient Hospital Stay (HOSPITAL_COMMUNITY)
Admission: RE | Admit: 2022-12-11 | Discharge: 2022-12-12 | DRG: 468 | Disposition: A | Discharge status: Home / Self Care | Payer: Medicare Other | Source: Ambulatory Visit | Attending: Orthopedic Surgery | Admitting: Orthopedic Surgery

## 2022-12-11 ENCOUNTER — Inpatient Hospital Stay (HOSPITAL_COMMUNITY): Payer: Medicare Other | Admitting: Certified Registered Nurse Anesthetist

## 2022-12-11 ENCOUNTER — Encounter (HOSPITAL_COMMUNITY)
Admission: RE | Disposition: A | Discharge status: Home / Self Care | Payer: Self-pay | Source: Ambulatory Visit | Attending: Orthopedic Surgery

## 2022-12-11 ENCOUNTER — Encounter (HOSPITAL_COMMUNITY): Payer: Self-pay | Admitting: Orthopedic Surgery

## 2022-12-11 DIAGNOSIS — T8454XA Infection and inflammatory reaction due to internal left knee prosthesis, initial encounter: Principal | ICD-10-CM | POA: Diagnosis present

## 2022-12-11 DIAGNOSIS — Z85828 Personal history of other malignant neoplasm of skin: Secondary | ICD-10-CM | POA: Diagnosis not present

## 2022-12-11 DIAGNOSIS — N400 Enlarged prostate without lower urinary tract symptoms: Secondary | ICD-10-CM | POA: Diagnosis present

## 2022-12-11 DIAGNOSIS — Z885 Allergy status to narcotic agent status: Secondary | ICD-10-CM

## 2022-12-11 DIAGNOSIS — T84093A Other mechanical complication of internal left knee prosthesis, initial encounter: Secondary | ICD-10-CM | POA: Diagnosis not present

## 2022-12-11 DIAGNOSIS — E7800 Pure hypercholesterolemia, unspecified: Secondary | ICD-10-CM

## 2022-12-11 DIAGNOSIS — Y831 Surgical operation with implant of artificial internal device as the cause of abnormal reaction of the patient, or of later complication, without mention of misadventure at the time of the procedure: Secondary | ICD-10-CM | POA: Diagnosis present

## 2022-12-11 DIAGNOSIS — I10 Essential (primary) hypertension: Secondary | ICD-10-CM

## 2022-12-11 DIAGNOSIS — E785 Hyperlipidemia, unspecified: Secondary | ICD-10-CM | POA: Diagnosis not present

## 2022-12-11 DIAGNOSIS — Z831 Family history of other infectious and parasitic diseases: Secondary | ICD-10-CM

## 2022-12-11 DIAGNOSIS — Z79899 Other long term (current) drug therapy: Secondary | ICD-10-CM

## 2022-12-11 DIAGNOSIS — Z8249 Family history of ischemic heart disease and other diseases of the circulatory system: Secondary | ICD-10-CM

## 2022-12-11 DIAGNOSIS — Z96652 Presence of left artificial knee joint: Principal | ICD-10-CM

## 2022-12-11 HISTORY — PX: REIMPLANTATION OF TOTAL KNEE: SHX6052

## 2022-12-11 SURGERY — REVISION, TOTAL ARTHROPLASTY, KNEE
Anesthesia: Spinal | Site: Knee | Laterality: Left

## 2022-12-11 MED ORDER — EPINEPHRINE PF 1 MG/ML IJ SOLN
INTRAMUSCULAR | Status: AC
  Filled 2022-12-11: qty 1

## 2022-12-11 MED ORDER — ACETAMINOPHEN 500 MG PO TABS
1000.0000 mg | ORAL_TABLET | Freq: Once | ORAL | Status: AC
  Administered 2022-12-11: 1000 mg via ORAL
  Filled 2022-12-11: qty 2

## 2022-12-11 MED ORDER — ACETAMINOPHEN 500 MG PO TABS
1000.0000 mg | ORAL_TABLET | Freq: Four times a day (QID) | ORAL | Status: DC
  Administered 2022-12-11 – 2022-12-12 (×4): 1000 mg via ORAL
  Filled 2022-12-11 (×4): qty 2

## 2022-12-11 MED ORDER — MAGNESIUM OXIDE -MG SUPPLEMENT 400 (240 MG) MG PO TABS
400.0000 mg | ORAL_TABLET | Freq: Every morning | ORAL | Status: DC
  Administered 2022-12-12: 400 mg via ORAL
  Filled 2022-12-11: qty 1

## 2022-12-11 MED ORDER — FENTANYL CITRATE (PF) 100 MCG/2ML IJ SOLN
INTRAMUSCULAR | Status: AC
  Filled 2022-12-11: qty 2

## 2022-12-11 MED ORDER — KETOROLAC TROMETHAMINE 30 MG/ML IJ SOLN
INTRAMUSCULAR | Status: DC | PRN
  Administered 2022-12-11: 30 mg

## 2022-12-11 MED ORDER — ROSUVASTATIN CALCIUM 5 MG PO TABS
5.0000 mg | ORAL_TABLET | Freq: Every day | ORAL | Status: DC
  Administered 2022-12-11: 5 mg via ORAL
  Filled 2022-12-11: qty 1

## 2022-12-11 MED ORDER — LOSARTAN POTASSIUM 50 MG PO TABS
50.0000 mg | ORAL_TABLET | Freq: Every day | ORAL | Status: DC
  Administered 2022-12-12: 50 mg via ORAL
  Filled 2022-12-11: qty 1

## 2022-12-11 MED ORDER — METOCLOPRAMIDE HCL 5 MG PO TABS: 5.0000 mg | ORAL_TABLET | Freq: Three times a day (TID) | ORAL | Status: DC | PRN

## 2022-12-11 MED ORDER — DEXAMETHASONE SODIUM PHOSPHATE 10 MG/ML IJ SOLN
10.0000 mg | Freq: Once | INTRAMUSCULAR | Status: AC
  Administered 2022-12-12: 10 mg via INTRAVENOUS
  Filled 2022-12-11: qty 1

## 2022-12-11 MED ORDER — SODIUM CHLORIDE 0.9 % IR SOLN
Status: DC | PRN
  Administered 2022-12-11: 3000 mL
  Administered 2022-12-11: 1000 mL

## 2022-12-11 MED ORDER — VANCOMYCIN HCL 1000 MG IV SOLR
INTRAVENOUS | Status: AC
  Filled 2022-12-11: qty 20

## 2022-12-11 MED ORDER — ORAL CARE MOUTH RINSE: 15.0000 mL | Freq: Once | OROMUCOSAL | Status: AC

## 2022-12-11 MED ORDER — PROPOFOL 500 MG/50ML IV EMUL
INTRAVENOUS | Status: AC
  Filled 2022-12-11: qty 50

## 2022-12-11 MED ORDER — POVIDONE-IODINE 10 % EX SWAB: 2.0000 | Freq: Once | CUTANEOUS | Status: DC

## 2022-12-11 MED ORDER — HYDROMORPHONE HCL 1 MG/ML IJ SOLN: 0.5000 mg | INTRAMUSCULAR | Status: DC | PRN

## 2022-12-11 MED ORDER — PHENOL 1.4 % MT LIQD: 1.0000 | OROMUCOSAL | Status: DC | PRN

## 2022-12-11 MED ORDER — ONDANSETRON HCL 4 MG/2ML IJ SOLN
INTRAMUSCULAR | Status: DC | PRN
  Administered 2022-12-11: 4 mg via INTRAVENOUS

## 2022-12-11 MED ORDER — KETOROLAC TROMETHAMINE 30 MG/ML IJ SOLN
INTRAMUSCULAR | Status: AC
  Filled 2022-12-11: qty 1

## 2022-12-11 MED ORDER — TRANEXAMIC ACID-NACL 1000-0.7 MG/100ML-% IV SOLN
1000.0000 mg | Freq: Once | INTRAVENOUS | Status: AC
  Administered 2022-12-11: 1000 mg via INTRAVENOUS
  Filled 2022-12-11: qty 100

## 2022-12-11 MED ORDER — DEXAMETHASONE SODIUM PHOSPHATE 10 MG/ML IJ SOLN
INTRAMUSCULAR | Status: DC | PRN
  Administered 2022-12-11: 10 mg
  Administered 2022-12-11: 8 mg

## 2022-12-11 MED ORDER — BISACODYL 10 MG RE SUPP: 10.0000 mg | Freq: Every day | RECTAL | Status: DC | PRN

## 2022-12-11 MED ORDER — SODIUM CHLORIDE 0.9 % IV SOLN
INTRAVENOUS | Status: AC | PRN
  Administered 2022-12-11: 30 mL via INTRAMUSCULAR

## 2022-12-11 MED ORDER — ONDANSETRON HCL 4 MG/2ML IJ SOLN: 4.0000 mg | Freq: Four times a day (QID) | INTRAMUSCULAR | Status: DC | PRN

## 2022-12-11 MED ORDER — PHENYLEPHRINE HCL-NACL 20-0.9 MG/250ML-% IV SOLN
INTRAVENOUS | Status: DC | PRN
  Administered 2022-12-11: 30 ug/min via INTRAVENOUS

## 2022-12-11 MED ORDER — METHOCARBAMOL 500 MG PO TABS
500.0000 mg | ORAL_TABLET | Freq: Four times a day (QID) | ORAL | Status: DC | PRN
  Administered 2022-12-11 – 2022-12-12 (×2): 500 mg via ORAL
  Filled 2022-12-11 (×2): qty 1

## 2022-12-11 MED ORDER — MENTHOL 3 MG MT LOZG: 1.0000 | LOZENGE | OROMUCOSAL | Status: DC | PRN

## 2022-12-11 MED ORDER — BUPIVACAINE HCL 0.25 % IJ SOLN
INTRAMUSCULAR | Status: AC
  Filled 2022-12-11: qty 1

## 2022-12-11 MED ORDER — POLYETHYLENE GLYCOL 3350 17 G PO PACK
17.0000 g | PACK | Freq: Two times a day (BID) | ORAL | Status: DC
  Filled 2022-12-11 (×2): qty 1

## 2022-12-11 MED ORDER — ONDANSETRON HCL 4 MG/2ML IJ SOLN
INTRAMUSCULAR | Status: AC
  Filled 2022-12-11: qty 2

## 2022-12-11 MED ORDER — BUPIVACAINE IN DEXTROSE 0.75-8.25 % IT SOLN
INTRATHECAL | Status: DC | PRN
  Administered 2022-12-11: 2 mL via INTRATHECAL

## 2022-12-11 MED ORDER — MIDAZOLAM HCL 2 MG/2ML IJ SOLN: 2.0000 mg | Freq: Once | INTRAMUSCULAR | Status: DC

## 2022-12-11 MED ORDER — OXYCODONE HCL 5 MG PO TABS
5.0000 mg | ORAL_TABLET | ORAL | Status: DC | PRN
  Administered 2022-12-11: 5 mg via ORAL
  Administered 2022-12-12: 10 mg via ORAL
  Filled 2022-12-11 (×3): qty 2
  Filled 2022-12-11: qty 1

## 2022-12-11 MED ORDER — VANCOMYCIN HCL 1 G IV SOLR
INTRAVENOUS | Status: DC | PRN
  Administered 2022-12-11: 1000 mg

## 2022-12-11 MED ORDER — DEXAMETHASONE SODIUM PHOSPHATE 10 MG/ML IJ SOLN: 8.0000 mg | Freq: Once | INTRAMUSCULAR | Status: DC

## 2022-12-11 MED ORDER — TRANEXAMIC ACID-NACL 1000-0.7 MG/100ML-% IV SOLN
1000.0000 mg | INTRAVENOUS | Status: AC
  Administered 2022-12-11: 1000 mg via INTRAVENOUS
  Filled 2022-12-11: qty 100

## 2022-12-11 MED ORDER — ASPIRIN 81 MG PO CHEW
81.0000 mg | CHEWABLE_TABLET | Freq: Two times a day (BID) | ORAL | Status: DC
  Administered 2022-12-11 – 2022-12-12 (×2): 81 mg via ORAL
  Filled 2022-12-11 (×2): qty 1

## 2022-12-11 MED ORDER — DOCUSATE SODIUM 100 MG PO CAPS
100.0000 mg | ORAL_CAPSULE | Freq: Two times a day (BID) | ORAL | Status: DC
  Administered 2022-12-11 – 2022-12-12 (×2): 100 mg via ORAL
  Filled 2022-12-11 (×2): qty 1

## 2022-12-11 MED ORDER — FENTANYL CITRATE (PF) 100 MCG/2ML IJ SOLN
INTRAMUSCULAR | Status: DC | PRN
  Administered 2022-12-11 (×2): 50 ug via INTRAVENOUS

## 2022-12-11 MED ORDER — LACTATED RINGERS IV SOLN: INTRAVENOUS | Status: DC

## 2022-12-11 MED ORDER — OXYCODONE HCL 5 MG PO TABS
10.0000 mg | ORAL_TABLET | ORAL | Status: DC | PRN
  Administered 2022-12-11 – 2022-12-12 (×2): 10 mg via ORAL

## 2022-12-11 MED ORDER — PROPOFOL 1000 MG/100ML IV EMUL
INTRAVENOUS | Status: AC
  Filled 2022-12-11: qty 100

## 2022-12-11 MED ORDER — METHOCARBAMOL 1000 MG/10ML IJ SOLN: 500.0000 mg | Freq: Four times a day (QID) | INTRAVENOUS | Status: DC | PRN

## 2022-12-11 MED ORDER — CEFAZOLIN SODIUM-DEXTROSE 2-4 GM/100ML-% IV SOLN
2.0000 g | INTRAVENOUS | Status: AC
  Administered 2022-12-11: 2 g via INTRAVENOUS
  Filled 2022-12-11: qty 100

## 2022-12-11 MED ORDER — CHLORHEXIDINE GLUCONATE 0.12 % MT SOLN
15.0000 mL | Freq: Once | OROMUCOSAL | Status: AC
  Administered 2022-12-11: 15 mL via OROMUCOSAL

## 2022-12-11 MED ORDER — BUPIVACAINE-EPINEPHRINE (PF) 0.25% -1:200000 IJ SOLN
INTRAMUSCULAR | Status: DC | PRN
  Administered 2022-12-11: 30 mL

## 2022-12-11 MED ORDER — FENTANYL CITRATE PF 50 MCG/ML IJ SOSY: 100.0000 ug | PREFILLED_SYRINGE | Freq: Once | INTRAMUSCULAR | Status: DC

## 2022-12-11 MED ORDER — METOCLOPRAMIDE HCL 5 MG/ML IJ SOLN: 5.0000 mg | Freq: Three times a day (TID) | INTRAMUSCULAR | Status: DC | PRN

## 2022-12-11 MED ORDER — SODIUM CHLORIDE 0.9 % IV SOLN: INTRAVENOUS | Status: DC

## 2022-12-11 MED ORDER — CEFAZOLIN SODIUM-DEXTROSE 2-4 GM/100ML-% IV SOLN
2.0000 g | Freq: Four times a day (QID) | INTRAVENOUS | Status: AC
  Administered 2022-12-11 (×2): 2 g via INTRAVENOUS
  Filled 2022-12-11 (×2): qty 100

## 2022-12-11 MED ORDER — ONDANSETRON HCL 4 MG PO TABS: 4.0000 mg | ORAL_TABLET | Freq: Four times a day (QID) | ORAL | Status: DC | PRN

## 2022-12-11 MED ORDER — STERILE WATER FOR IRRIGATION IR SOLN
Status: DC | PRN
  Administered 2022-12-11: 2000 mL

## 2022-12-11 MED ORDER — DEXAMETHASONE SODIUM PHOSPHATE 10 MG/ML IJ SOLN
INTRAMUSCULAR | Status: AC
  Filled 2022-12-11: qty 1

## 2022-12-11 MED ORDER — SODIUM CHLORIDE (PF) 0.9 % IJ SOLN
INTRAMUSCULAR | Status: AC
  Filled 2022-12-11: qty 30

## 2022-12-11 MED ORDER — PROPOFOL 500 MG/50ML IV EMUL
INTRAVENOUS | Status: DC | PRN
  Administered 2022-12-11: 100 ug/kg/min via INTRAVENOUS

## 2022-12-11 MED ORDER — ROPIVACAINE HCL 5 MG/ML IJ SOLN
INTRAMUSCULAR | Status: DC | PRN
  Administered 2022-12-11: 20 mL via PERINEURAL

## 2022-12-11 MED ORDER — PROPOFOL 10 MG/ML IV BOLUS
INTRAVENOUS | Status: DC | PRN
Start: 2022-12-11 — End: 2022-12-11
  Administered 2022-12-11: 30 mg via INTRAVENOUS

## 2022-12-11 MED ORDER — DIPHENHYDRAMINE HCL 12.5 MG/5ML PO ELIX: 12.5000 mg | ORAL_SOLUTION | ORAL | Status: DC | PRN

## 2022-12-11 SURGICAL SUPPLY — 77 items
ADH SKN CLS APL DERMABOND .7 (GAUZE/BANDAGES/DRESSINGS) ×2
ATTUNE MED ANAT PAT 38 KNEE (Knees) IMPLANT
ATUNE CEMENTED STEM 16X80 (Stem) ×1 IMPLANT
AUG FEM SZ6 4 REV POST STRL LF (Miscellaneous) ×2 IMPLANT
AUGMENT POST FEM SZ6 4 KNEE (Miscellaneous) IMPLANT
BAG COUNTER SPONGE SURGICOUNT (BAG) IMPLANT
BAG SPEC THK2 15X12 ZIP CLS (MISCELLANEOUS) ×1
BAG SPNG CNTER NS LX DISP (BAG) ×1
BAG ZIPLOCK 12X15 (MISCELLANEOUS) ×1 IMPLANT
BASE TIB KNEE REV RP ATUNE SZ6 (Knees) IMPLANT
BLADE SAW SGTL 11.0X1.19X90.0M (BLADE) ×1 IMPLANT
BLADE SAW SGTL 13.0X1.19X90.0M (BLADE) ×1 IMPLANT
BLADE SAW SGTL 81X20 HD (BLADE) ×1 IMPLANT
BNDG CMPR 6 X 5 YARDS HK CLSR (GAUZE/BANDAGES/DRESSINGS) ×1
BNDG CMPR 6"X 5 YARDS HK CLSR (GAUZE/BANDAGES/DRESSINGS) ×1
BNDG ELASTIC 6INX 5YD STR LF (GAUZE/BANDAGES/DRESSINGS) ×1 IMPLANT
BOWL SMART MIX CTS (DISPOSABLE) ×1 IMPLANT
BRUSH FEMORAL CANAL (MISCELLANEOUS) IMPLANT
BSPLAT TIB 6 CMNT REV ROT PLAT (Knees) ×1 IMPLANT
CEMENT HV SMART SET (Cement) ×2 IMPLANT
CEMENT RESTRICTOR DEPUY SZ 6 (Cement) IMPLANT
COMP FEM ATTUNE CRS SZ6 LT (Femur) ×1 IMPLANT
COMPONENT FEM ATN CRS SZ6 LT (Femur) IMPLANT
CONE REV ATTUNE KNEE MED (Knees) IMPLANT
COOLER ICEMAN CLASSIC (MISCELLANEOUS) IMPLANT
COVER SURGICAL LIGHT HANDLE (MISCELLANEOUS) ×1 IMPLANT
CUFF TOURN SGL QUICK 34 (TOURNIQUET CUFF) ×1
CUFF TRNQT CYL 34X4.125X (TOURNIQUET CUFF) ×1 IMPLANT
DERMABOND ADVANCED .7 DNX12 (GAUZE/BANDAGES/DRESSINGS) ×1 IMPLANT
DRAPE INCISE IOBAN 66X45 STRL (DRAPES) ×3 IMPLANT
DRAPE U-SHAPE 47X51 STRL (DRAPES) ×2 IMPLANT
DRESSING AQUACEL AG SP 3.5X10 (GAUZE/BANDAGES/DRESSINGS) IMPLANT
DRSG AQUACEL AG ADV 3.5X10 (GAUZE/BANDAGES/DRESSINGS) IMPLANT
DRSG AQUACEL AG ADV 3.5X14 (GAUZE/BANDAGES/DRESSINGS) IMPLANT
DRSG AQUACEL AG SP 3.5X10 (GAUZE/BANDAGES/DRESSINGS) ×1
DURAPREP 26ML APPLICATOR (WOUND CARE) ×2 IMPLANT
ELECT REM PT RETURN 15FT ADLT (MISCELLANEOUS) ×1 IMPLANT
GLOVE BIO SURGEON STRL SZ 6 (GLOVE) ×1 IMPLANT
GLOVE BIOGEL PI IND STRL 7.5 (GLOVE) ×3 IMPLANT
GLOVE BIOGEL PI IND STRL 8.5 (GLOVE) ×1 IMPLANT
GLOVE ECLIPSE 8.0 STRL XLNG CF (GLOVE) ×2 IMPLANT
GLOVE INDICATOR 6.5 STRL GRN (GLOVE) ×2 IMPLANT
GOWN STRL REUS W/ TWL LRG LVL3 (GOWN DISPOSABLE) ×3 IMPLANT
GOWN STRL REUS W/TWL LRG LVL3 (GOWN DISPOSABLE) ×3
HOLDER FOLEY CATH W/STRAP (MISCELLANEOUS) ×1 IMPLANT
IMMOBILIZER KNEE 22 UNIV (SOFTGOODS) IMPLANT
INSERT TIB CRS ATTUNE SZ6 8 (Insert) IMPLANT
JET LAVAGE IRRISEPT WOUND (IRRIGATION / IRRIGATOR) ×1
KIT TURNOVER KIT A (KITS) IMPLANT
LAVAGE JET IRRISEPT WOUND (IRRIGATION / IRRIGATOR) ×1 IMPLANT
MANIFOLD NEPTUNE II (INSTRUMENTS) ×1 IMPLANT
NDL SAFETY ECLIP 18X1.5 (MISCELLANEOUS) ×1 IMPLANT
NS IRRIG 1000ML POUR BTL (IV SOLUTION) ×1 IMPLANT
PACK TOTAL KNEE CUSTOM (KITS) ×1 IMPLANT
PAD COLD SHLDR WRAP-ON (PAD) IMPLANT
PIN FIX SIGMA LCS THRD HI (PIN) IMPLANT
PROTECTOR NERVE ULNAR (MISCELLANEOUS) ×1 IMPLANT
RESTRICTOR CEMENT SZ 5 C-STEM (Cement) IMPLANT
SET PAD KNEE POSITIONER (MISCELLANEOUS) ×1 IMPLANT
SLEEVE TIB ATTUNE FP 45 (Knees) IMPLANT
STAPLER VISISTAT 35W (STAPLE) IMPLANT
STEM ATTUNE CEMENTED 16X80 (Stem) IMPLANT
STEM REV CEMENTED 14X50MM (Stem) IMPLANT
SUT ETHIBOND NAB CT1 #1 30IN (SUTURE) IMPLANT
SUT MNCRL AB 3-0 PS2 18 (SUTURE) ×1 IMPLANT
SUT STRATAFIX PDS+ 0 24IN (SUTURE) ×1 IMPLANT
SUT VIC AB 1 CT1 36 (SUTURE) ×3 IMPLANT
SUT VIC AB 2-0 CT1 27 (SUTURE) ×3
SUT VIC AB 2-0 CT1 TAPERPNT 27 (SUTURE) ×3 IMPLANT
SWAB COLLECTION DEVICE MRSA (MISCELLANEOUS) IMPLANT
SWAB CULTURE ESWAB REG 1ML (MISCELLANEOUS) IMPLANT
SYR 3ML LL SCALE MARK (SYRINGE) ×1 IMPLANT
TOWER CARTRIDGE SMART MIX (DISPOSABLE) IMPLANT
TRAY FOLEY MTR SLVR 16FR STAT (SET/KITS/TRAYS/PACK) ×1 IMPLANT
TUBE SUCTION HIGH CAP CLEAR NV (SUCTIONS) ×1 IMPLANT
WATER STERILE IRR 1000ML POUR (IV SOLUTION) ×2 IMPLANT
WRAP KNEE MAXI GEL POST OP (GAUZE/BANDAGES/DRESSINGS) ×1 IMPLANT

## 2022-12-11 NOTE — Interval H&P Note (Signed)
History and Physical Interval Note:  12/11/2022 7:11 AM  Charles Norton  has presented today for surgery, with the diagnosis of Status post resection of left total knee arthroplasty and removal of antibiotic spacer.  The various methods of treatment have been discussed with the patient and family. After consideration of risks, benefits and other options for treatment, the patient has consented to  Procedure(s): REIMPLANTATION/REVISION OF TOTAL KNEE (Left) as a surgical intervention.  The patient's history has been reviewed, patient examined, no change in status, stable for surgery.  I have reviewed the patient's chart and labs.  Questions were answered to the patient's satisfaction.     Shelda Pal

## 2022-12-11 NOTE — Evaluation (Signed)
Physical Therapy Evaluation Patient Details Name: Charles Norton MRN: [id number] DOB: [birth year] Today's Date: 12/11/2022  History of Present Illness  Pt is a 82 year old male s/p reimplantation of his left total knee on 12/11/22.  Pt recently s/p excisional L TKA with ABX spacer on 09/30/2022. PMH including but not limited to: AKI, SIRS, HTN, L TKA (2018), and B RTC repair.  Clinical Impression  Patient is s/p above surgery resulting in functional limitations due to the deficits listed below (see PT Problem List).  Patient will benefit from acute skilled PT to increase their independence and safety with mobility to facilitate discharge.  Pt with KI in place on arrival.  Pt educated to maintain KI and keep knee in extension.  Pt able to ambulate in hallway and denies increased pain.  Pt plans to return home with spouse upon d/c.           Assistance Recommended at Discharge PRN  If plan is discharge home, recommend the following:  Can travel by private vehicle  A little help with walking and/or transfers;A little help with bathing/dressing/bathroom;Assist for transportation;Help with stairs or ramp for entrance;Assistance with cooking/housework        Equipment Recommendations None recommended by PT  Recommendations for Other Services       Functional Status Assessment Patient has had a recent decline in their functional status and demonstrates the ability to make significant improvements in function in a reasonable and predictable amount of time.     Precautions / Restrictions Precautions Precautions: Knee;Fall Precaution Comments: "Intra op patellar tendon was very fragile" per PA so maintain knee extension, no knee flexion (not in orders yet but clarified restrictions via secure chat) Required Braces or Orthoses: Knee Immobilizer - Left Knee Immobilizer - Left: On at all times Restrictions Weight Bearing Restrictions: No LLE Weight Bearing: Weight bearing as tolerated       Mobility  Bed Mobility Overal bed mobility: Needs Assistance Bed Mobility: Supine to Sit     Supine to sit: Min guard, HOB elevated     General bed mobility comments: verbal cues for technique    Transfers Overall transfer level: Needs assistance Equipment used: Rolling walker (2 wheels) Transfers: Sit to/from Stand Sit to Stand: Min guard           General transfer comment: verbal cues for UE and LE positioning    Ambulation/Gait Ambulation/Gait assistance: Min guard Gait Distance (Feet): 120 Feet Assistive device: Rolling walker (2 wheels) Gait Pattern/deviations: Step-to pattern, Decreased stance time - left, Antalgic       General Gait Details: verbal cues for sequence, RW positioning, step length  Stairs            Wheelchair Mobility     Tilt Bed    Modified Rankin (Stroke Patients Only)       Balance                                             Pertinent Vitals/Pain Pain Assessment Pain Assessment: 0-10 Pain Score: 3  Pain Location: left hip Pain Descriptors / Indicators: Sore Pain Intervention(s): Repositioned, Monitored during session, Premedicated before session    Home Living Family/patient expects to be discharged to:: Private residence Living Arrangements: Spouse/significant other Available Help at Discharge: Family Type of Home: House Home Access: Stairs to enter Entrance Stairs-Rails: Right Entrance Stairs-Number of  Steps: 3   Home Layout: One level Home Equipment: Agricultural consultant (2 wheels)      Prior Function Prior Level of Function : Independent/Modified Independent                     Hand Dominance        Extremity/Trunk Assessment        Lower Extremity Assessment Lower Extremity Assessment: LLE deficits/detail LLE Deficits / Details: maintained KI, able to perform ankle pumps, no knee flexion       Communication   Communication: No difficulties  Cognition  Arousal/Alertness: Awake/alert Behavior During Therapy: WFL for tasks assessed/performed Overall Cognitive Status: Within Functional Limits for tasks assessed                                          General Comments      Exercises     Assessment/Plan    PT Assessment Patient needs continued PT services  PT Problem List Decreased strength;Decreased mobility;Decreased balance;Pain;Decreased knowledge of use of DME;Decreased range of motion       PT Treatment Interventions Stair training;Gait training;DME instruction;Therapeutic exercise;Balance training;Functional mobility training;Therapeutic activities;Patient/family education    PT Goals (Current goals can be found in the Care Plan section)  Acute Rehab PT Goals PT Goal Formulation: With patient Time For Goal Achievement: 12/18/22 Potential to Achieve Goals: Good    Frequency 7X/week     Co-evaluation               AM-PAC PT "6 Clicks" Mobility  Outcome Measure Help needed turning from your back to your side while in a flat bed without using bedrails?: A Little Help needed moving from lying on your back to sitting on the side of a flat bed without using bedrails?: A Little Help needed moving to and from a bed to a chair (including a wheelchair)?: A Little Help needed standing up from a chair using your arms (e.g., wheelchair or bedside chair)?: A Little Help needed to walk in hospital room?: A Little Help needed climbing 3-5 steps with a railing? : A Lot 6 Click Score: 17    End of Session Equipment Utilized During Treatment: Gait belt Activity Tolerance: Patient tolerated treatment well Patient left: in chair;with call bell/phone within reach;with chair alarm set;with nursing/sitter in room Nurse Communication: Mobility status PT Visit Diagnosis: Difficulty in walking, not elsewhere classified (R26.2)    Time: 6073-7106 PT Time Calculation (min) (ACUTE ONLY): 17 min   Charges:   PT  Evaluation $PT Eval Low Complexity: 1 Low   PT General Charges $$ ACUTE PT VISIT: 1 Visit        Paulino Door, DPT Physical Therapist Acute Rehabilitation Services Office: [phone]   Janan Halter [place] 12/11/2022, 4:50 PM

## 2022-12-11 NOTE — Plan of Care (Signed)

## 2022-12-11 NOTE — Anesthesia Postprocedure Evaluation (Signed)
Anesthesia Post Note  Patient: Charles Norton  Procedure(s) Performed: REIMPLANTATION/REVISION OF TOTAL KNEE (Left: Knee)     Patient location during evaluation: PACU Anesthesia Type: Spinal Level of consciousness: awake, awake and alert and oriented Pain management: pain level controlled Vital Signs Assessment: post-procedure vital signs reviewed and stable Respiratory status: spontaneous breathing, nonlabored ventilation and respiratory function stable Cardiovascular status: blood pressure returned to baseline and stable Postop Assessment: no headache, no backache, spinal receding and no apparent nausea or vomiting Anesthetic complications: no   No notable events documented.  Last Vitals:  Vitals:   12/11/22 1334 12/11/22 1341  BP: (!) 144/88   Pulse: 73   Resp:  16  Temp:  (!) 36.3 C  SpO2: 100%     Last Pain:  Vitals:   12/11/22 1517  TempSrc:   PainSc: 3                  Collene Schlichter

## 2022-12-11 NOTE — Discharge Instructions (Signed)

## 2022-12-11 NOTE — Anesthesia Procedure Notes (Signed)
Spinal  Patient location during procedure: OR Start time: 12/11/2022 7:50 AM End time: 12/11/2022 7:55 AM Reason for block: surgical anesthesia Staffing Anesthesiologist: Bethena Midget, MD Performed by: Bethena Midget, MD Authorized by: Collene Schlichter, MD   Preanesthetic Checklist Completed: patient identified, IV checked, site marked, risks and benefits discussed, surgical consent, monitors and equipment checked, pre-op evaluation and timeout performed Spinal Block Patient position: sitting Prep: DuraPrep Patient monitoring: heart rate, cardiac monitor, continuous pulse ox and blood pressure Approach: midline Location: L3-4 Injection technique: single-shot Needle Needle type: Sprotte  Needle gauge: 24 G Needle length: 9 cm Assessment Sensory level: T4 Events: CSF return Additional Notes May have had CSF leak with introducer

## 2022-12-11 NOTE — Anesthesia Procedure Notes (Signed)
Procedure Name: MAC Date/Time: 12/11/2022 7:50 AM  Performed by: Vanessa [place], CRNAPre-anesthesia Checklist: Patient identified, Emergency Drugs available, Suction available and Patient being monitored Patient Re-evaluated:Patient Re-evaluated prior to induction Oxygen Delivery Method: Simple face mask

## 2022-12-11 NOTE — Anesthesia Procedure Notes (Signed)
Anesthesia Regional Block: Adductor canal block   Pre-Anesthetic Checklist: , timeout performed,  Correct Patient, Correct Site, Correct Laterality,  Correct Procedure, Correct Position, site marked,  Risks and benefits discussed,  Surgical consent,  Pre-op evaluation,  At surgeon's request and post-op pain management  Laterality: Left  Prep: chloraprep       Needles:  Injection technique: Single-shot  Needle Type: Echogenic Needle     Needle Length: 9cm  Needle Gauge: 21     Additional Needles:   Procedures:,,,, ultrasound used (permanent image in chart),,    Narrative:  Start time: 12/11/2022 6:42 AM End time: 12/11/2022 6:49 AM Injection made incrementally with aspirations every 5 mL.  Performed by: Personally  Anesthesiologist: Collene Schlichter, MD  Additional Notes: No pain on injection. No increased resistance to injection. Injection made in 5cc increments.  Good needle visualization.  Patient tolerated procedure well.

## 2022-12-11 NOTE — Transfer of Care (Signed)
Immediate Anesthesia Transfer of Care Note  Patient: Charles Norton  Procedure(s) Performed: REIMPLANTATION/REVISION OF TOTAL KNEE (Left: Knee)  Patient Location: PACU  Anesthesia Type:Spinal  Level of Consciousness: awake and patient cooperative  Airway & Oxygen Therapy: Patient Spontanous Breathing and Patient connected to face mask  Post-op Assessment: Report given to RN and Post -op Vital signs reviewed and stable  Post vital signs: Reviewed and stable  Last Vitals:  Vitals Value Taken Time  BP 105/63 12/11/22 1027  Temp    Pulse 82 12/11/22 1029  Resp 10 12/11/22 1029  SpO2 95 % 12/11/22 1029  Vitals shown include unfiled device data.  Last Pain:  Vitals:   12/11/22 0624  TempSrc: Oral  PainSc:          Complications: No notable events documented.

## 2022-12-12 ENCOUNTER — Encounter (HOSPITAL_COMMUNITY): Payer: Self-pay | Admitting: Orthopedic Surgery

## 2022-12-12 ENCOUNTER — Other Ambulatory Visit (HOSPITAL_COMMUNITY): Payer: Self-pay

## 2022-12-12 LAB — CBC
HCT: 26.7 % — ABNORMAL LOW (ref 39.0–52.0)
Hemoglobin: 8.5 g/dL — ABNORMAL LOW (ref 13.0–17.0)
MCH: 31.4 pg (ref 26.0–34.0)
MCHC: 31.8 g/dL (ref 30.0–36.0)
MCV: 98.5 fL (ref 80.0–100.0)
Platelets: 238 10*3/uL (ref 150–400)
RBC: 2.71 MIL/uL — ABNORMAL LOW (ref 4.22–5.81)
RDW: 13.2 % (ref 11.5–15.5)
WBC: 10.9 10*3/uL — ABNORMAL HIGH (ref 4.0–10.5)
nRBC: 0 % (ref 0.0–0.2)

## 2022-12-12 LAB — BASIC METABOLIC PANEL
Anion gap: 6 (ref 5–15)
BUN: 19 mg/dL (ref 8–23)
CO2: 21 mmol/L — ABNORMAL LOW (ref 22–32)
Calcium: 8.4 mg/dL — ABNORMAL LOW (ref 8.9–10.3)
Chloride: 109 mmol/L (ref 98–111)
Creatinine, Ser: 0.86 mg/dL (ref 0.61–1.24)
GFR, Estimated: >60 mL/min (ref >60)
Glucose, Bld: 113 mg/dL — ABNORMAL HIGH (ref 70–99)
Potassium: 4.2 mmol/L (ref 3.5–5.1)
Sodium: 136 mmol/L (ref 135–145)

## 2022-12-12 MED ORDER — METHOCARBAMOL 500 MG PO TABS
500.0000 mg | ORAL_TABLET | Freq: Four times a day (QID) | ORAL | 2 refills | Status: DC | PRN
  Filled 2022-12-12: qty 40, 10d supply, fill #0

## 2022-12-12 MED ORDER — POLYETHYLENE GLYCOL 3350 17 GM/SCOOP PO POWD
17.0000 g | Freq: Two times a day (BID) | ORAL | 0 refills | Status: DC
  Filled 2022-12-12: qty 238, 7d supply, fill #0

## 2022-12-12 MED ORDER — OXYCODONE HCL 5 MG PO TABS
5.0000 mg | ORAL_TABLET | ORAL | 0 refills | Status: DC | PRN
  Filled 2022-12-12: qty 42, 7d supply, fill #0

## 2022-12-12 MED ORDER — ASPIRIN 81 MG PO CHEW
81.0000 mg | CHEWABLE_TABLET | Freq: Two times a day (BID) | ORAL | 0 refills | Status: AC
  Filled 2022-12-12: qty 56, 28d supply, fill #0

## 2022-12-12 NOTE — TOC Transition Note (Addendum)
Transition of Care [facility]) - CM/SW Discharge Note  Patient Details  Name: Charles Norton MRN: [id number] Date of Birth: [birth year]  [facility]) CM/SW Contact:  Ewing Schlein, LCSW Phone Number: 12/12/2022, 10:17 AM  Clinical Narrative: Patient is expected to discharge home after working with PT. CSW met with patient to confirm discharge plan. Patient will go home with OPPT at [facility]. Patient has a rolling walker at home, so there are no DME needs at this time. TOC signing off.   Final next level of care: OP Rehab Barriers to Discharge: No Barriers Identified  Patient Goals and CMS Choice Choice offered to / list presented to : NA  Discharge Plan and Services Additional resources added to the After Visit Summary for         DME Arranged: N/A DME Agency: NA  Social Determinants of Health (SDOH) Interventions SDOH Screenings   Food Insecurity: No Food Insecurity (12/11/2022)  Housing: Low Risk  (12/11/2022)  Transportation Needs: No Transportation Needs (12/11/2022)  Utilities: Not At Risk (12/11/2022)  Depression (PHQ2-9): Low Risk  (11/11/2022)  Tobacco Use: Low Risk  (12/11/2022)   Readmission Risk Interventions     No data to display

## 2022-12-12 NOTE — Progress Notes (Signed)
Nurse reviewed discharge instructions with pt.  Pt verbalized understanding of discharge instructions, follow up appointments and new medications.  No concerns at time of discharge. 

## 2022-12-12 NOTE — Progress Notes (Signed)
Physical Therapy Treatment Patient Details Name: Charles Norton MRN: [id number] DOB: 08-Feb-1941 Today's Date: 12/12/2022   History of Present Illness Pt is a 82 year old male s/p reimplantation of his left total knee on 12/11/22.  Pt recently s/p excisional L TKA with ABX spacer on 09/30/2022. PMH including but not limited to: AKI, SIRS, HTN, L TKA (2018), and B RTC repair.    PT Comments  Pt reports pain controlled.  Pt ambulated in hallway and practiced safe stair technique.  Reviewed maintaining knee extension and use of KI.  Pt anticipates d/c home today and had no further questions.     Assistance Recommended at Discharge PRN  If plan is discharge home, recommend the following:  Can travel by private vehicle    A little help with walking and/or transfers;A little help with bathing/dressing/bathroom;Assist for transportation;Help with stairs or ramp for entrance;Assistance with cooking/housework      Equipment Recommendations  None recommended by PT    Recommendations for Other Services       Precautions / Restrictions Precautions Precautions: Knee;Fall Precaution Comments: reviewed maintaining knee extension and use of KI Required Braces or Orthoses: Knee Immobilizer - Left Knee Immobilizer - Left: On at all times Restrictions Weight Bearing Restrictions: Yes LLE Weight Bearing: Weight bearing as tolerated     Mobility  Bed Mobility Overal bed mobility: Needs Assistance Bed Mobility: Supine to Sit     Supine to sit: Min guard, HOB elevated     General bed mobility comments: verbal cues for technique    Transfers Overall transfer level: Needs assistance Equipment used: Rolling walker (2 wheels) Transfers: Sit to/from Stand Sit to Stand: Min guard           General transfer comment: verbal cues for UE and LE positioning    Ambulation/Gait Ambulation/Gait assistance: Min guard Gait Distance (Feet): 160 Feet Assistive device: Rolling walker (2  wheels) Gait Pattern/deviations: Step-to pattern, Decreased stance time - left, Antalgic       General Gait Details: verbal cues for sequence, RW positioning, step length   Stairs Stairs: Yes Stairs assistance: Min guard Stair Management: Step to pattern, Forwards, One rail Right, With cane Number of Stairs: 3 General stair comments: pt able to recall correct sequence, performs safely, no questions   Wheelchair Mobility     Tilt Bed    Modified Rankin (Stroke Patients Only)       Balance                                            Cognition Arousal/Alertness: Awake/alert Behavior During Therapy: WFL for tasks assessed/performed Overall Cognitive Status: Within Functional Limits for tasks assessed                                          Exercises      General Comments        Pertinent Vitals/Pain Pain Assessment Pain Assessment: 0-10 Pain Location: left thigh Pain Descriptors / Indicators: Sore Pain Intervention(s): Repositioned, Monitored during session, Ice applied    Home Living                          Prior Function  PT Goals (current goals can now be found in the care plan section) Progress towards PT goals: Progressing toward goals    Frequency    7X/week      PT Plan Current plan remains appropriate    Co-evaluation              AM-PAC PT "6 Clicks" Mobility   Outcome Measure  Help needed turning from your back to your side while in a flat bed without using bedrails?: A Little Help needed moving from lying on your back to sitting on the side of a flat bed without using bedrails?: A Little Help needed moving to and from a bed to a chair (including a wheelchair)?: A Little Help needed standing up from a chair using your arms (e.g., wheelchair or bedside chair)?: A Little Help needed to walk in hospital room?: A Little Help needed climbing 3-5 steps with a railing? : A  Little 6 Click Score: 18    End of Session Equipment Utilized During Treatment: Gait belt Activity Tolerance: Patient tolerated treatment well Patient left: in chair;with call bell/phone within reach Nurse Communication: Mobility status PT Visit Diagnosis: Difficulty in walking, not elsewhere classified (R26.2)     Time: 4098-1191 PT Time Calculation (min) (ACUTE ONLY): 15 min  Charges:    $Gait Training: 8-22 mins PT General Charges $$ ACUTE PT VISIT: 1 Visit                    Paulino Door, DPT Physical Therapist Acute Rehabilitation Services Office: [phone]    Janan Halter [place] 12/12/2022, 12:19 PM

## 2022-12-12 NOTE — Progress Notes (Signed)
   Subjective: 1 Day Post-Op Procedure(s) (LRB): REIMPLANTATION/REVISION OF TOTAL KNEE (Left) Patient reports pain as mild.   Patient seen in rounds with Dr. [name]. Patient is well, and has had no acute complaints or problems. No acute events overnight. Foley catheter removed. Patient ambulated 120 feet with PT.  We will start therapy today.   Objective: Vital signs in last 24 hours: Temp:  [96.9 F (36.1 C)-98.4 F (36.9 C)] 97.9 F (36.6 C) (07/19 0530) Pulse Rate:  [63-92] 63 (07/19 0530) Resp:  [10-17] 17 (07/19 0530) BP: (105-144)/(61-88) 110/68 (07/19 0530) SpO2:  [94 %-100 %] 97 % (07/19 0530)  Intake/Output from previous day:  Intake/Output Summary (Last 24 hours) at 12/12/2022 0749 Last data filed at 12/12/2022 0600 Gross per 24 hour  Intake 4277.56 ml  Output 3100 ml  Net 1177.56 ml     Intake/Output this shift: No intake/output data recorded.  Labs: Recent Labs    12/12/22 0355  HGB 8.5*   Recent Labs    12/12/22 0355  WBC 10.9*  RBC 2.71*  HCT 26.7*  PLT 238   Recent Labs    12/12/22 0355  NA 136  K 4.2  CL 109  CO2 21*  BUN 19  CREATININE 0.86  GLUCOSE 113*  CALCIUM 8.4*   No results for input(s): "LABPT", "INR" in the last 72 hours.  Exam: General - Patient is Alert and Oriented Extremity - Neurologically intact Sensation intact distally Intact pulses distally Dorsiflexion/Plantar flexion intact Dressing - dressing C/D/I Motor Function - intact, moving foot and toes well on exam.   Past Medical History:  Diagnosis Date   Adenomatous colon polyp    Anemia    Arthritis    Basal cell carcinoma    BPH (benign prostatic hyperplasia)    Cataract of left eye    due to trauma in left eye   Diverticulosis    in sigmoid colon and in the descending colon   Hemorrhoids    Hypercholesterolemia    Hypertension    Iron deficiency    anemia   Lichen planus    of tongue   Osteoarthritis    PONV (postoperative nausea and vomiting)     Rectal prolapse    Shingles    right ophthalmic involvement   Tinnitus     Assessment/Plan: 1 Day Post-Op Procedure(s) (LRB): REIMPLANTATION/REVISION OF TOTAL KNEE (Left) Principal Problem:   S/P revision of total knee, left  Estimated body mass index is 22.2 kg/m as calculated from the following:   Height as of this encounter: 5\' 8"  (1.727 m).   Weight as of this encounter: 66.2 kg. Advance diet Up with therapy D/C IV fluids    DVT Prophylaxis - Aspirin Weight bearing as tolerated. Knee immobilizer, maintaining full extension May remove to shower if careful  We will hold off on PT until he comes in to see [place] in 2 weeks Will update our PT office  Plan is to go Home after hospital stay. Plan for discharge today following 1-2 sessions of PT as long as they are meeting their goals. Follow up in the office in 2 weeks.   Rosalene Billings, PA-C Orthopedic Surgery [phone] 12/12/2022, 7:49 AM

## 2022-12-12 NOTE — Plan of Care (Signed)
Slept well.  Uses call button to request pain meds appropriately.

## 2022-12-13 NOTE — Op Note (Signed)
NAME: Charles Norton, Charles Norton MEDICAL RECORD NO: [id number] ACCOUNT NO: [id number] DATE OF BIRTH: Aug 05, 1940 FACILITY: [facility] LOCATION: [id number] PHYSICIAN: Madlyn Frankel. 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  PROCEDURE:  Revision/reimplantation left total knee arthroplasty.  COMPONENTS USED:  DePuy Attune knee revision system with a size 6 tibial revision tray with a 45 mm press-fit sleeve and a 14 x 50 cemented stem.  On the femoral side, we used a size 6 left revision femoral component with 4 mm posterior medial and  lateral augments, a size medium press-fit cone, a 16 x 80 cemented stem.  We used a size 8 mm revision polyethylene insert to match the 6 femur and a 38 patellar button.  SURGEON:  Madlyn Frankel. [name], MD  ASSISTANT:  Rosalene Billings, PA-C.  Note, Ms. Domenic Schwab was present for the entirety of the case from preoperative positioning, perioperative management of the operative extremity, general facilitation of case and primary wound closure.  ANESTHESIA:  Regional plus spinal.  BLOOD LOSS:  Less than 200 mL.  TOURNIQUET:  Tourniquet was up for a total of 68 minutes at 225 mmHg.  DRAINS:  None.  COMPLICATIONS:  None.  SPECIMENS:  None sent.  INDICATIONS FOR PROCEDURE:  The patient is an 82 year old gentleman with history of left total knee arthroplasty.  He had been doing well over the past 5 years or so until recently, noting increasing pain and swelling.  Workup in the office revealed  concerns for infection based on laboratory studies and aspiration.  We took him to the operating room approximately 2-1/2 months ago and resected his total knee arthroplasty and  placement of articulating antibiotic spacer.  He received 6 weeks of IV  antibiotics.  His serum markers normalized.  There was no palpable effusion for aspiration.  For that reason, we scheduled reimplantation.  The risks of recurrent infection discussed.  Risks of DVT, component failure, stiffness postoperatively were  reviewed.  Consent was obtained for the benefit of pain relief.  DESCRIPTION OF PROCEDURE:  The patient was brought to the operative theater.  Once adequate anesthesia, preoperative antibiotics, Ancef administered, he was positioned supine with a left thigh tourniquet placed.  Left lower extremity was prepped and  draped in sterile fashion.  A timeout was performed identifying the patient, planned procedure, and extremity.  The leg was exsanguinated and tourniquet elevated to 225 mmHg.  We had marked his incision on the skin.  We used his old incision plus I  needed to extend it proximally and distal for exposure.  His arc of motion was noted to be 0 to about 50 degrees with relative stiffness.  Following the initial skin incision, I created soft tissue planes.  We then made a median arthrotomy.  Once we  entered the joint, we encountered yellow synovial fluid without concern for recurrent infection.  Given these findings, we proceeded with our planned procedure.  The most challenging portion of this case was exposure.  Per my usual technique, exposure  was obtained by scar debridement and carefully extending the arthrotomy up into the quadriceps tendon and musculature.  I re-created the medial gutter and performed a soft tissue plane without complication.  As we were working on the lateral gutter  and  exposure of the patella, which was done very carefully, we did note some pulling away of the patellar tendon at the tubercle, which I can recall occurred similarly at the time of his excision of his implant.  I placed a smooth pin at the tubercle to provide  support throughout the case.   Once I had enough exposure of the knee, we started to work on removing his cemented implant.  I first removed the femoral side without bone loss.  We removed the intramedullary dowel that was placed as well.  Following  further exposure, the tibial cemented insert was removed as well as the dowel.  With these removed, I began preparing the bone. We reamed on both sides, the femoral and the tibial side to the 18 on the femoral side and 16 on the tibia side.  I then  decided to use a size 6 tibial tray and broached up to a size 45 broach on the tibial side, which sat slightly proud of the tibial cut.  I then placed a tibial tray on this to use as a reference for the femoral component.  I sized the femur to be a size  6.  We refreshed the distal cut off the distal femur and then made the cuts on the femoral side to match the 6 component.  During the cuts, we identified that we would use 4 mm augments on the posterior medial and lateral aspect of the femur.  I did a  trial reduction with the trial components with stems.  I found that the 6 mm insert allowed the knee to come to full extension.  His knee remained very tight despite the exposure; however, the knee appeared to be stable medially and laterally through a  range of motion.  At this point, with the trial components in place, I was able to partially evert the patella to allow for debridement, re-cutting and selecting a 38 patellar button.  Lug holes were drilled.  At this point, all the trial components were removed.  Cement  restrictors were positioned both in the distal femur and proximal tibia.We had  previously irrigated the canals with canal brush irrigator on the femoral-tibial side.  I completed irrigation with pulse lavage irrigation and the final components were opened.  The final components were configured on the back table.  Please note that I  did prepare the distal femur for a medium sized sleeve to accept a 16 mm cemented stem.  Once the  final components were configured on the back table, cement was mixed.  I cemented the distal portion of the tibia and then impacted the tibial cemented  sleeve to the location proximally where the trial broach had sat.  We then cemented the proximal aspect of the femoral component and then passed the stem with cement on the femoral component and the distal femur through the sleeve.    The knee was then brought to extension with the 8 mm insert as there was slight hyperextension with the 6 trial.  Extruded cement was removed.  The knee was kept in extension until  the cement fully cured.  Once the cement fully cured, excessive cement was removed and we selected the 8 mm revision insert for the 6 femur.  This insert was placed in the tibia and the knee was reduced.  We reirrigated the knee as we let down the  tourniquet after 68 minutes.  There was no significant hemostasis to be attained other than that  of scar tissue oozing and synovial oozing.  At this point, I focused on the extensor mechanism reconstruction and closure.  We reapproximated the vast  majority of the extensor mechanism using #1 Vicryl from the proximal aspect and the quadriceps tendon around the patella.  At the tibial tubercle region, I sutured this with a combination of #1 Vicryl and #1 Ethibond suture to provide extra support.  The  extensor mechanism was closed.  We used 1 gram of vancomycin that was placed into the joint and then extra-articular space.  The remainder of the wound was closed in layers with 2-0 Vicryl and a running Monocryl stitch.  The wound was clean, dry and  dressed sterilely using surgical glue and Aquacel dressing.  Based on the extensor mechanism, we are going to have him stay in the knee immobilizer for a couple of weeks to allow his knee to rest.  These findings were reviewed with his wife and will be  reviewed with the patient.  After a couple of weeks, we will begin working on range of motion to make certain  that there are no complications with any acute falls given the condition of his patellar tendon at the tubercle.       PAA D: 12/13/2022 7:45:37 am T: 12/13/2022 8:16:00 am  JOB: [id number]/ [id number]

## 2022-12-13 NOTE — Brief Op Note (Signed)
12/11/2022  7:31 AM  PATIENT:  Charles Norton  82 y.o. male  PRE-OPERATIVE DIAGNOSIS:  Status post resection of left total knee arthroplasty and removal of antibiotic spacer  POST-OPERATIVE DIAGNOSIS:  Status post resection of left total knee arthroplasty and removal of antibiotic spacer  PROCEDURE:  Procedure(s): REIMPLANTATION/REVISION OF TOTAL KNEE (Left)  SURGEON:  Surgeons and Role:    Durene Romans, MD - Primary  PHYSICIAN ASSISTANT: Rosalene Billings, PA-C  ANESTHESIA:   regional and spinal  EBL:  100 mL   BLOOD ADMINISTERED:none  DRAINS: none   LOCAL MEDICATIONS USED:  MARCAINE, 1 gm of Vancomycin powder     SPECIMEN:  No Specimen  DISPOSITION OF SPECIMEN:  N/A  COUNTS:  YES  TOURNIQUET:   Total Tourniquet Time Documented: Thigh (Left) - 68 minutes Total: Thigh (Left) - 68 minutes   DICTATION: .Other Dictation: Dictation Number [id number]  PLAN OF CARE: Admit to inpatient   PATIENT DISPOSITION:  PACU - hemodynamically stable.   Delay start of Pharmacological VTE agent (>24hrs) due to surgical blood loss or risk of bleeding: no

## 2022-12-23 NOTE — Discharge Summary (Signed)
Patient ID: Charles Norton MRN: [id number] DOB/AGE: 1940/12/29 82 y.o.  Admit date: 12/11/2022 Discharge date: 12/12/2022  Admission Diagnoses:  Status post left total knee resection with antibiotic spacer placement  Discharge Diagnoses:  Principal Problem:   S/P revision of total knee, left   Past Medical History:  Diagnosis Date   Adenomatous colon polyp    Anemia    Arthritis    Basal cell carcinoma    BPH (benign prostatic hyperplasia)    Cataract of left eye    due to trauma in left eye   Diverticulosis    in sigmoid colon and in the descending colon   Hemorrhoids    Hypercholesterolemia    Hypertension    Iron deficiency    anemia   Lichen planus    of tongue   Osteoarthritis    PONV (postoperative nausea and vomiting)    Rectal prolapse    Shingles    right ophthalmic involvement   Tinnitus     Surgeries: Procedure(s): REIMPLANTATION/REVISION OF TOTAL KNEE on 12/11/2022   Consultants:   Discharged Condition: Improved  Hospital Course: Charles Norton is an 82 y.o. male who was admitted 12/11/2022 for operative treatment ofS/P revision of total knee, left. Patient has severe unremitting pain that affects sleep, daily activities, and work/hobbies. After pre-op clearance the patient was taken to the operating room on 12/11/2022 and underwent  Procedure(s): REIMPLANTATION/REVISION OF TOTAL KNEE.    Patient was given perioperative antibiotics:  Anti-infectives (From admission, onward)    Start     Dose/Rate Route Frequency Ordered Stop   12/11/22 1400  ceFAZolin (ANCEF) IVPB 2g/100 mL premix        2 g 200 mL/hr over 30 Minutes Intravenous Every 6 hours 12/11/22 1129 12/11/22 2132   12/11/22 0932  vancomycin (VANCOCIN) powder  Status:  Discontinued          As needed 12/11/22 0932 12/11/22 1128   12/11/22 0600  ceFAZolin (ANCEF) IVPB 2g/100 mL premix        2 g 200 mL/hr over 30 Minutes Intravenous On call to O.R. 12/11/22 0541 12/11/22 0757        Patient  was given sequential compression devices, early ambulation, and chemoprophylaxis to prevent DVT. Patient worked with PT and was meeting their goals regarding safe ambulation and transfers.  Patient benefited maximally from hospital stay and there were no complications.    Recent vital signs: No data found.   Recent laboratory studies: No results for input(s): "WBC", "HGB", "HCT", "PLT", "NA", "K", "CL", "CO2", "BUN", "CREATININE", "GLUCOSE", "INR", "CALCIUM" in the last 72 hours.  Invalid input(s): "PT", "2"   Discharge Medications:   Allergies as of 12/12/2022       Reactions   Other Nausea And Vomiting   All pain medications.  Can tolerate if anti-nausea medication first        Medication List     TAKE these medications    Acetaminophen Extra Strength 500 MG Tabs Commonly known as: TYLENOL Take 1 tablet (500 mg total) by mouth every 6 (six) hours as needed for moderate pain.   Align 4 MG Caps Take 1 capsule by mouth in the morning and at bedtime. After lunch & after supper   Aspirin Low Dose 81 MG chewable tablet Generic drug: aspirin Chew 1 tablet (81 mg) by mouth 2 times daily for 28 days.   BENEFIBER PO Take 1 Dose by mouth in the morning. Mixed with coffee   carboxymethylcellulose 0.5 %  Soln Commonly known as: REFRESH PLUS Place 1 drop into both eyes in the morning.   Fish Oil 1000 MG Caps Take 1 capsule by mouth in the morning.   ICAPS LUTEIN-ZEAXANTHIN PO Take 1 tablet by mouth in the morning.   losartan 50 MG tablet Commonly known as: COZAAR Take 50 mg by mouth daily. On Hold for Surgery per patient   magnesium oxide 400 MG tablet Commonly known as: MAG-OX Take 400 mg by mouth in the morning.   methocarbamol 500 MG tablet Commonly known as: ROBAXIN Take 1 tablet (500 mg) by mouth every 6 hours as needed for muscle spasms.   MULTI VITAMIN DAILY PO Take 1 tablet by mouth in the morning.   oxyCODONE 5 MG immediate release tablet Commonly known  as: Oxy IR/ROXICODONE Take 1 tablet (5 mg) by mouth every 4 hours as needed for severe pain.   polyethylene glycol powder 17 GM/SCOOP powder Commonly known as: GLYCOLAX/MIRALAX Take 17 g mixed in liquid by mouth 2 (two) times daily as directed.   rosuvastatin 5 MG tablet Commonly known as: CRESTOR Take 5 mg by mouth at bedtime.   Turmeric 500 MG Caps Take 1,000 mg by mouth in the morning.   vitamin C 1000 MG tablet Take 1,000 mg by mouth in the morning.   Vitamin D3 50 MCG (2000 UT) Tabs Take 2,000 Units by mouth in the morning.               Discharge Care Instructions  (From admission, onward)           Start     Ordered   12/12/22 0000  Change dressing       Comments: Maintain surgical dressing until follow up in the clinic. If the edges start to pull up, may reinforce with tape. If the dressing is no longer working, may remove and cover with gauze and tape, but must keep the area dry and clean.  Call with any questions or concerns.   12/12/22 0751            Diagnostic Studies: No results found.  Disposition: Discharge disposition: 01-Home or Self Care       Discharge Instructions     Call MD / Call 911   Complete by: As directed    If you experience chest pain or shortness of breath, CALL 911 and be transported to the hospital emergency room.  If you develope a fever above 101 F, pus (white drainage) or increased drainage or redness at the wound, or calf pain, call your surgeon's office.   Change dressing   Complete by: As directed    Maintain surgical dressing until follow up in the clinic. If the edges start to pull up, may reinforce with tape. If the dressing is no longer working, may remove and cover with gauze and tape, but must keep the area dry and clean.  Call with any questions or concerns.   Constipation Prevention   Complete by: As directed    Drink plenty of fluids.  Prune juice may be helpful.  You may use a stool softener, such as  Colace (over the counter) 100 mg twice a day.  Use MiraLax (over the counter) for constipation as needed.   Diet - low sodium heart healthy   Complete by: As directed    Increase activity slowly as tolerated   Complete by: As directed    Weight bearing as tolerated with assist device (walker, cane, etc) as directed,  use it as long as suggested by your surgeon or therapist, typically at least 4-6 weeks.   Post-operative opioid taper instructions:   Complete by: As directed    POST-OPERATIVE OPIOID TAPER INSTRUCTIONS: It is important to wean off of your opioid medication as soon as possible. If you do not need pain medication after your surgery it is ok to stop day one. Opioids include: Codeine, Hydrocodone(Norco, Vicodin), Oxycodone(Percocet, oxycontin) and hydromorphone amongst others.  Long term and even short term use of opiods can cause: Increased pain response Dependence Constipation Depression Respiratory depression And more.  Withdrawal symptoms can include Flu like symptoms Nausea, vomiting And more Techniques to manage these symptoms Hydrate well Eat regular healthy meals Stay active Use relaxation techniques(deep breathing, meditating, yoga) Do Not substitute Alcohol to help with tapering If you have been on opioids for less than two weeks and do not have pain than it is ok to stop all together.  Plan to wean off of opioids This plan should start within one week post op of your joint replacement. Maintain the same interval or time between taking each dose and first decrease the dose.  Cut the total daily intake of opioids by one tablet each day Next start to increase the time between doses. The last dose that should be eliminated is the evening dose.      TED hose   Complete by: As directed    Use stockings (TED hose) for 2 weeks on both leg(s).  You may remove them at night for sleeping.        Follow-up Information     Durene Romans, MD. Schedule an  appointment as soon as possible for a visit in 2 week(s).   Specialty: Orthopedic Surgery Contact information: [address] [place] [address] [phone]                  Signed: Cassandria Anger 12/23/2022, 8:59 AM

## 2023-01-06 ENCOUNTER — Other Ambulatory Visit: Payer: Self-pay

## 2023-01-06 ENCOUNTER — Other Ambulatory Visit: Payer: Self-pay | Admitting: Family Medicine

## 2023-01-06 ENCOUNTER — Ambulatory Visit: Admission: RE | Admit: 2023-01-06 | Payer: Medicare Other | Source: Ambulatory Visit

## 2023-01-06 ENCOUNTER — Ambulatory Visit: Payer: Medicare Other | Admitting: Infectious Diseases

## 2023-01-06 VITALS — BP 125/81 | HR 73 | Resp 16 | Ht 68.0 in | Wt 150.0 lb

## 2023-01-06 DIAGNOSIS — T8454XD Infection and inflammatory reaction due to internal left knee prosthesis, subsequent encounter: Secondary | ICD-10-CM

## 2023-01-06 DIAGNOSIS — L03011 Cellulitis of right finger: Secondary | ICD-10-CM

## 2023-01-06 DIAGNOSIS — Z79899 Other long term (current) drug therapy: Secondary | ICD-10-CM | POA: Diagnosis not present

## 2023-01-06 DIAGNOSIS — R9389 Abnormal findings on diagnostic imaging of other specified body structures: Secondary | ICD-10-CM

## 2023-01-06 NOTE — Progress Notes (Unsigned)
Patient Active Problem List   Diagnosis Date Noted  . S/P revision of total knee, left 12/11/2022  . PICC (peripherally inserted central catheter) removal 11/11/2022  . Medication management 10/21/2022  . PICC (peripherally inserted central catheter) in place 10/21/2022  . Infection of prosthetic left knee joint (HCC) 09/30/2022  . AKI (acute kidney injury) (HCC) 09/17/2022  . Generalized weakness 09/17/2022  . Dehydration 09/17/2022  . Pain and swelling of knee, left 09/17/2022  . SIRS (systemic inflammatory response syndrome) (HCC) 09/17/2022  . BPH (benign prostatic hyperplasia) 09/17/2022  . Essential hypertension 09/17/2022  . 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 Abnormal EKG 02/26/2017  . Hyperlipidemia 02/26/2017  . Elevated blood pressure reading in office without diagnosis of hypertension 02/26/2017  . Preoperative cardiovascular examination 02/26/2017  . HERPES SIMPLEX INFECTION, RECURRENT 08/26/2008  . ANEMIA, IRON DEFICIENCY 08/26/2008  . DIVERTICULOSIS, COLON, HX OF 08/26/2008  . INGUINAL HERNIORRHAPHY, HX OF 07/12/2004   Current Outpatient Medications on File Prior to Visit  Medication Sig Dispense Refill  . amoxicillin (AMOXIL) 500 MG capsule Take 500 mg by mouth 2 (two) times daily.    Marland Kitchen doxycycline (VIBRA-TABS) 100 MG tablet Take 100 mg by mouth 2 (two) times daily.    Marland Kitchen acetaminophen (TYLENOL) 500 MG tablet Take 1 tablet (500 mg total) by mouth every 6 (six) hours as needed for moderate pain. 20 tablet 0  . Ascorbic Acid (VITAMIN C) 1000 MG tablet Take 1,000 mg by mouth in the morning.    Marland Kitchen aspirin 81 MG chewable tablet Chew 1 tablet (81 mg) by mouth 2 times daily for 28 days. 56 tablet 0  . carboxymethylcellulose (REFRESH PLUS) 0.5 % SOLN Place 1 drop into both eyes in the morning.    . Cholecalciferol (VITAMIN D3) 50 MCG (2000 UT) TABS Take 2,000 Units by mouth in the morning.    Marland Kitchen losartan (COZAAR) 50 MG tablet Take 50 mg  by mouth daily. On Hold for Surgery per patient (Patient not taking: Reported on 10/21/2022)    . magnesium oxide (MAG-OX) 400 MG tablet Take 400 mg by mouth in the morning.    . methocarbamol (ROBAXIN) 500 MG tablet Take 1 tablet (500 mg) by mouth every 6 hours as needed for muscle spasms. 40 tablet 2  . Multiple Vitamin (MULTI VITAMIN DAILY PO) Take 1 tablet by mouth in the morning.    . Omega-3 Fatty Acids (FISH OIL) 1000 MG CAPS Take 1 capsule by mouth in the morning.    Marland Kitchen oxyCODONE (OXY IR/ROXICODONE) 5 MG immediate release tablet Take 1 tablet (5 mg) by mouth every 4 hours as needed for severe pain. 42 tablet 0  . polyethylene glycol powder (GLYCOLAX/MIRALAX) 17 GM/SCOOP powder Take 17 g mixed in liquid by mouth 2 (two) times daily as directed. 238 g 0  . Probiotic Product (ALIGN) 4 MG CAPS Take 1 capsule by mouth in the morning and at bedtime. After lunch & after supper    . rosuvastatin (CRESTOR) 5 MG tablet Take 5 mg by mouth at bedtime.    . saccharomyces boulardii (FLORASTOR) 250 MG capsule 1 capsule Orally twice a day    . Specialty Vitamins Products (ICAPS LUTEIN-ZEAXANTHIN PO) Take 1 tablet by mouth in the morning.    . tamsulosin (FLOMAX) 0.4 MG CAPS capsule 1 capsule as needed Orally Once a day for 90 days    . Turmeric 500 MG CAPS Take 1,000 mg by  mouth in the morning.    . Wheat Dextrin (BENEFIBER PO) Take 1 Dose by mouth in the morning. Mixed with coffee     No current facility-administered medications on file prior to visit.    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 Discharged on 5/9 to complete 6 weeks course of IV pen G, EOT 11/11/22  Getting IV pen G until 6/17 when PICC came out. Underwent re-implantation of rt knee 7/18. No signs of recurrence of infection in OR. He however reported me taking  5 days of abtx twice a day 10 days  prior to surgery for rt thumb nail infection then another 5 days of cephalexin 10-12 days ago in the other finger in rt hand which has resolved now.   01/06/23 Denies any pain or tenderness in rt knee. He is ambulatory. He has followed up with Ortho after surgery and has been advised to not stretch his rt knee. Next fu with Orthopedics is Aug 23. He is seeing PT starting tomorrow.   Review of Systems: all systems reviewed with pertinent positives and negatives as listed above  Past Medical History:  Diagnosis Date  . Adenomatous colon polyp   . Anemia   . Arthritis   . Basal cell carcinoma   . BPH (benign prostatic hyperplasia)   . Cataract of left eye    due to trauma in left eye  . Diverticulosis    in sigmoid colon and in the descending colon  . Hemorrhoids   . Hypercholesterolemia   . Hypertension   . Iron deficiency    anemia  . Lichen planus    of tongue  . Osteoarthritis   . PONV (postoperative nausea and vomiting)   . Rectal prolapse   . Shingles    right ophthalmic involvement  . Tinnitus    Past Surgical History:  Procedure Laterality Date  . COLONOSCOPY     2010 and 2020  . EXCISIONAL TOTAL KNEE ARTHROPLASTY WITH ANTIBIOTIC SPACERS Left 09/30/2022   Procedure: EXCISIONAL TOTAL KNEE ARTHROPLASTY WITH ANTIBIOTIC SPACERS;  Surgeon: Durene Romans, MD;  Location: [facility];  Service: Orthopedics;  Laterality: Left;  . EYE SURGERY Bilateral    cataracts  . INGUINAL HERNIA REPAIR Bilateral   . KNEE SURGERY Bilateral    arthrosocopy   . REIMPLANTATION OF TOTAL KNEE Left 12/11/2022   Procedure: REIMPLANTATION/REVISION OF TOTAL KNEE;  Surgeon: Durene Romans, MD;  Location: [facility];  Service: Orthopedics;  Laterality: Left;  . removal of cyst     sphenoid cyst   intranasal sphenoidectomy  . ROTATOR CUFF REPAIR Bilateral   . 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   Procedure: LEFT TOTAL KNEE ARTHROPLASTY;  Surgeon: Durene Romans, MD;  Location: [facility];  Service:  Orthopedics;  Laterality: Left;  70 mins with block    Social History   Tobacco Use  . Smoking status: Never  . Smokeless tobacco: Never  Vaping Use  . Vaping status: Never Used  Substance Use Topics  . Alcohol use: Not Currently    Comment: 1-2 drinks per day  . Drug use: No    Family History  Problem Relation Age of Onset  . Tuberculosis Mother   . Hypertension Father   . Heart disease Father   . AAA (abdominal aortic aneurysm) Father   . Obesity Brother   . Colon cancer Neg Hx   .  Esophageal cancer Neg Hx   . Pancreatic cancer Neg Hx   . Stomach cancer Neg Hx   . Liver disease Neg Hx     Allergies  Allergen Reactions  . Other Nausea And Vomiting    All pain medications.  Can tolerate if anti-nausea medication first     Health Maintenance  Topic Date Due  . Pneumonia Vaccine 37+ Years old (2 of 2 - PPSV23 or PCV20) 01/22/2016  . Zoster Vaccines- Shingrix (2 of 2) 04/14/2017  . COVID-19 Vaccine (3 - Moderna risk series) 08/03/2019  . Medicare Annual Wellness (AWV)  12/21/2019  . DTaP/Tdap/Td (2 - Td or Tdap) 01/26/2021  . INFLUENZA VACCINE  12/25/2022  . HPV VACCINES  Aged Out  . Colonoscopy  Discontinued    Objective: BP 125/81   Pulse 73   Resp 16   Ht 5\' 8"  (1.727 m)   Wt 150 lb (68 kg)   SpO2 97%   BMI 22.81 kg/m    Physical Exam Constitutional:      Appearance: Normal appearance.  HENT:     Head: Normocephalic and atraumatic.      Mouth: Mucous membranes are moist.  Eyes:    Conjunctiva/sclera: Conjunctivae normal.     Pupils: Pupils are equal, round, and bilaterally symmetrical   Cardiovascular:     Rate and Rhythm: Normal rate and regular rhythm.     Heart sounds: s1s2  Pulmonary:     Effort: Pulmonary effort is normal.     Breath sounds: Normal breath sounds.   Abdominal:     General: Non distended     Palpations: soft.   Musculoskeletal:        General: left knee wrapped in a bandage inside knee immobilizer, mild swelling  as expected in post op knee but no erythema, hot or tender, no surrounding cellulitis. Surgical site has healed. Ambulatory with cane outside and walker at home  Skin:    General: Skin is warm and dry.     Comments: Rt arm picc with no concerns noted   Neurological:     General: grossly non focal     Mental Status: awake, alert and oriented to person, place, and time.   Psychiatric:        Mood and Affect: Mood normal.   Lab Results Lab Results  Component Value Date   WBC 10.9 (H) 12/12/2022   HGB 8.5 (L) 12/12/2022   HCT 26.7 (L) 12/12/2022   MCV 98.5 12/12/2022   PLT 238 12/12/2022    Lab Results  Component Value Date   CREATININE 0.86 12/12/2022   BUN 19 12/12/2022   NA 136 12/12/2022   K 4.2 12/12/2022   CL 109 12/12/2022   CO2 21 (L) 12/12/2022    Lab Results  Component Value Date   ALT 21 09/17/2022   AST 34 09/17/2022   ALKPHOS 35 (L) 09/17/2022   BILITOT 0.9 09/17/2022    No results found for: "CHOL", "HDL", "LDLCALC", "LDLDIRECT", "TRIG", "CHOLHDL" No results found for: "LABRPR", "RPRTITER" No results found for: "HIV1RNAQUANT", "HIV1RNAVL", "CD4TABS"   Assessment/Plan 82 year old male with PMH as below including basal cell carcinoma, BPH, HLD, HTN, right eye shingles, left total knee arthroplasty 03/03/2017 who was directly admitted  for    # Left knee PJI  4/23 left knee aspiration: Turbid, unable to calculate WBC count, neutrophilic intracellular calcium pyrophosphate, Cx Group G strep  5/7 s/p resection of infected left TKA and placement of an antibiotic articulating spacer.  OR note with mention of purulent fluid. No OR cultures sent.  Completed approx  6 weeks of IV pen G  Will need at least 2 weeks off abtx prior to consideration of re-implantation Fu with Orthopedics as instructed  Fu in 6 weeks   # PICC Already out, site looks OK  # Medication management  6/4 ESR 11, CRP 2, CBC and BMP unremarkable   I have personally spent 31 minutes  involved in face-to-face and non-face-to-face activities for this patient on the day of the visit. Professional time spent includes the following activities: Preparing to see the patient (review of tests), Obtaining and/or reviewing separately obtained history (admission/discharge record), Performing a medically appropriate examination and/or evaluation , Ordering medications/tests/procedures, referring and communicating with other health care professionals, Documenting clinical information in the EMR, Independently interpreting results (not separately reported), Communicating results to the patient/family/caregiver, Counseling and educating the patient/family/caregiver and Care coordination (not separately reported).   Victoriano Lain, MD [facility] 01/06/2023, 11:07 AM

## 2023-01-07 DIAGNOSIS — L03011 Cellulitis of right finger: Secondary | ICD-10-CM | POA: Insufficient documentation

## 2023-03-03 ENCOUNTER — Ambulatory Visit: Payer: Medicare Other | Admitting: Internal Medicine

## 2023-03-03 ENCOUNTER — Encounter: Payer: Self-pay | Admitting: Internal Medicine

## 2023-03-03 VITALS — BP 110/68 | HR 76 | Ht 68.0 in | Wt 151.0 lb

## 2023-03-03 DIAGNOSIS — K625 Hemorrhage of anus and rectum: Secondary | ICD-10-CM | POA: Diagnosis not present

## 2023-03-03 DIAGNOSIS — K648 Other hemorrhoids: Secondary | ICD-10-CM | POA: Diagnosis not present

## 2023-03-03 DIAGNOSIS — K5909 Other constipation: Secondary | ICD-10-CM | POA: Diagnosis not present

## 2023-03-03 DIAGNOSIS — K623 Rectal prolapse: Secondary | ICD-10-CM

## 2023-03-03 DIAGNOSIS — R198 Other specified symptoms and signs involving the digestive system and abdomen: Secondary | ICD-10-CM

## 2023-03-03 NOTE — Progress Notes (Signed)
Charles Norton 82 y.o. [birth year] [id number]  Assessment & Plan:   Encounter Diagnoses  Name Primary?   Rectal prolapse suspected Yes   Internal hemorrhoids    Rectal bleeding from hemorrhoids and/or rectal prolapse    Chronic constipation    Abnormal defecation    He will increase his Benefiber to 2 tablespoons total.  He will look back on his home exercise program with respect to physical therapy he has not been doing that religiously and he will try to resume that.  I am going to refer him to colorectal surgery at [facility] to see what they might offer.  Perhaps he needs MR defecography though they may screen for prolapse in the office with their protocol there.  He certainly did have changes of prolapse on biopsies in the past so he may be a candidate for some sort of surgery versus a return to PT.  I do not think a colonoscopy is necessarily indicated but I will standby for surgery's recommendations. Subjective:   Chief Complaint: Prolapse, constipation and rectal bleeding  HPI 82 year old white man,who came to me in December 2021 with a question of rectal prolapse, biopsies suggested this on previous colonoscopy exam. I referred him for pelvic floor physical therapy which he was able to attend and on follow-up in 07/2020 he reported that his symptoms of prolapse and chronic constipation were much better.  Today he is complaining of bright red blood on the toilet paper and intermittent constipation.  Almost every day he will have a spot or 2 of bright red blood on the toilet paper with wiping.  He still thinks he has rectal prolapse issues every day and manually reduces.  He is using Benefiber at 4 teaspoons a day and that had helped but now he is having some small pieces of stool and perhaps some straining in the beginning of defecation so it is not thought to be helping as much as it once did.  Sometimes if he has a "bad prolapse" he might get dripping of blood that  goes on the floor when he is trying to manually reduce.  He is not describing significant pain with this.  He is aware of his home exercise program prescribed by physical therapy previously but he reports he has not been as diligent about that over time particularly related to his knee problems outlined below.  As you can see below he has had this problem for many years, please refer to the May 03, 2020 HPI pasted into this note.  Additional issues that is that he had to have his total knee prosthesis removed due to an infection, take antibiotics for weeks to months and then had it replaced.  He and his wife did recently returned from a trip to [place].     05/03/2020 HPI  The patient is a 82 year old white man here with his wife because of complaints of a 20-year history or so of rectal prolapse.  Past medical history significant for history of adenomatous colon polyps and benign prostatic hypertrophy.  He had seen Dr. Marca Ancona of [facility] and had a colonoscopy last year.  That exam revealed an ascending tubular adenoma and biopsies of inflamed appearing rectum showing reactive regenerative epithelium negative for dysplasia.  The differential diagnosis considerations included prolapse related injury, stercoral injury ischemia and drug injury.  He describes a history of intermittent rectal prolapse really have to reduce.  For many years he was constipated and strained and had extreme  difficulty defecating only got about once a week.  At around the age of 10 he says he trained himself to defecate images go to the bathroom and wait up to 20 minutes or so oftentimes strain.  Over this time.  He developed a problem with what was prolapse of tissue that he would have to reduce manually.  He tries to limit toilet time to only 5 minutes now so if he cannot defecate he will get up and move on.  That way the prolapse symptoms are much less frequent.  He had a colonoscopy in 2010 by Dr. Madilyn Fireman that demonstrated the same  type of rectal changes on endoscopic view and biopsy.  He also has diverticulosis he had a transverse colon polyp that was hyperplastic in 2010 and the polyp removed this time was only 4 mm, the adenoma.  Internal hemorrhoids were also noted on most recent exam.    Allergies  Allergen Reactions   Other Nausea And Vomiting    All pain medications.  Can tolerate if anti-nausea medication first    Current Meds  Medication Sig   acetaminophen (TYLENOL) 500 MG tablet Take 1 tablet (500 mg total) by mouth every 6 (six) hours as needed for moderate pain.   Ascorbic Acid (VITAMIN C) 1000 MG tablet Take 1,000 mg by mouth in the morning.   carboxymethylcellulose (REFRESH PLUS) 0.5 % SOLN Place 1 drop into both eyes in the morning.   Cholecalciferol (VITAMIN D3) 50 MCG (2000 UT) TABS Take 2,000 Units by mouth in the morning.   magnesium oxide (MAG-OX) 400 MG tablet Take 400 mg by mouth in the morning.   Multiple Vitamin (MULTI VITAMIN DAILY PO) Take 1 tablet by mouth in the morning.   Omega-3 Fatty Acids (FISH OIL) 1000 MG CAPS Take 1 capsule by mouth in the morning.   rosuvastatin (CRESTOR) 5 MG tablet Take 5 mg by mouth at bedtime.   Specialty Vitamins Products (ICAPS LUTEIN-ZEAXANTHIN PO) Take 1 tablet by mouth in the morning.   Turmeric 500 MG CAPS Take 1,000 mg by mouth in the morning.   Wheat Dextrin (BENEFIBER PO) Take 1 Dose by mouth in the morning. Mixed with coffee   Past Medical History:  Diagnosis Date   Adenomatous colon polyp    Anemia    Arthritis    Basal cell carcinoma    BPH (benign prostatic hyperplasia)    Cataract of left eye    due to trauma in left eye   Diverticulosis    in sigmoid colon and in the descending colon   Hemorrhoids    Hypercholesterolemia    Hypertension    Iron deficiency    anemia   Lichen planus    of tongue   Osteoarthritis    PONV (postoperative nausea and vomiting)    Rectal prolapse    Shingles    right ophthalmic involvement   Tinnitus     Past Surgical History:  Procedure Laterality Date   COLONOSCOPY     2010 and 2020   EXCISIONAL TOTAL KNEE ARTHROPLASTY WITH ANTIBIOTIC SPACERS Left 09/30/2022   Procedure: EXCISIONAL TOTAL KNEE ARTHROPLASTY WITH ANTIBIOTIC SPACERS;  Surgeon: Durene Romans, MD;  Location: [facility];  Service: Orthopedics;  Laterality: Left;   EYE SURGERY Bilateral    cataracts   INGUINAL HERNIA REPAIR Bilateral    KNEE SURGERY Bilateral    arthrosocopy    REIMPLANTATION OF TOTAL KNEE Left 12/11/2022   Procedure: REIMPLANTATION/REVISION OF TOTAL KNEE;  Surgeon: Durene Romans,  MD;  Location: [facility];  Service: Orthopedics;  Laterality: Left;   removal of cyst     sphenoid cyst   intranasal sphenoidectomy   ROTATOR CUFF REPAIR Bilateral    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   Procedure: LEFT TOTAL KNEE ARTHROPLASTY;  Surgeon: Durene Romans, MD;  Location: [facility];  Service: Orthopedics;  Laterality: Left;  70 mins with block   Social History   Social History Narrative   Married no children retired from [employer]   2 alcoholic drinks a day 2 caffeinated beverages a day never smoker no drug use   family history includes AAA (abdominal aortic aneurysm) in his father; Heart disease in his father; Hypertension in his father; Obesity in his brother; Tuberculosis in his mother.   Review of Systems As per HPI  Objective:   Physical Exam BP 110/68 (BP Location: Left Arm, Patient Position: Sitting, Cuff Size: Normal)   Pulse 76 Comment: irregular  Ht 5\' 8"  (1.727 m) Comment: height measured without shoes  Wt 151 lb (68.5 kg)   BMI 22.96 kg/m  NAD Rectal exam: No prolapse w/ Valsalva LL Anoderm inspection revealed no abnormalities  Digital exam revealed normal resting tone and voluntary squeeze. No mass or rectocele present. Simulated defecation with valsalva revealed appropriate abdominal contraction and descent.   Anoscopy reveals prominent erythematous internal hemorrhoids all positions Gr  2

## 2023-03-03 NOTE — Patient Instructions (Signed)
You have been scheduled for an appointment with ______________ at [facility]. Your appointment is on ________________ at _____________. Please arrive at ________________ for registration. Make certain to bring a list of current medications, including any over the counter medications or vitamins. Also bring your co-pay if you have one as well as your insurance cards. [facility] is located at [address]. Should you need to reschedule your appointment, please contact them at [phone].  Increase your Benefiber to 2 tablespoons daily.  I appreciate the opportunity to care for you. Stan Head, MD, [facility]

## 2023-04-07 ENCOUNTER — Ambulatory Visit: Payer: Self-pay | Admitting: General Surgery

## 2023-04-07 NOTE — H&P (Signed)
REFERRING PHYSICIAN:  Iva Boop, MD  PROVIDER:  Elenora Gamma, MD  MRN: [id number] DOB: [birth year] DATE OF ENCOUNTER: 04/07/2023  Subjective   Chief Complaint: New Consultation     History of Present Illness: Charles Norton is a 82 y.o. male who is seen today as an office consultation at the request of Dr. Leone Payor for evaluation of New Consultation .  82 year old male with concern for rectal prolapse.  He has had many years of difficulty with constipation and prolapse that has to be manually reduced.  It is gotten worse over the past couple years.  He notes prolapsing tissue with bowel movements that is manually reduced.  He is using a fiber supplement on a daily basis.  He is underwent pelvic floor physical therapy as well and 2022.  His last colonoscopy was in 2020.  This showed some ulcerated mucosa in the distal rectum.   Review of Systems: A complete review of systems was obtained from the patient.  I have reviewed this information and discussed as appropriate with the patient.  See HPI as well for other ROS.   Medical History: Past Medical History:  Diagnosis Date   Anemia    Arthritis     There is no problem list on file for this patient.   Past Surgical History:  Procedure Laterality Date   HERNIA REPAIR     JOINT REPLACEMENT     Shoulder surgery       No Known Allergies  Current Outpatient Medications on File Prior to Visit  Medication Sig Dispense Refill   ascorbic acid, vitamin C, (VITAMIN C) 1000 MG tablet Take 1,000 mg by mouth once daily     cholecalciferol (VITAMIN D3) 2,000 unit tablet Take 2,000 Units by mouth every morning     DOCOSAHEXAENOIC ACID ORAL Take 1 g by mouth once daily     docosahexaenoic acid/epa (FISH OIL ORAL) Take by mouth     flaxseed oiL Oil Take 1 capsule by mouth once daily     multivitamin tablet Take 1 tablet by mouth once daily     rosuvastatin (CRESTOR) 5 MG tablet Take 5 mg by mouth once daily     TURMERIC ORAL  Take 1,000 mg by mouth once daily     No current facility-administered medications on file prior to visit.    History reviewed. No pertinent family history.   Social History   Tobacco Use  Smoking Status Never  Smokeless Tobacco Never     Social History   Socioeconomic History   Marital status: Married  Tobacco Use   Smoking status: Never   Smokeless tobacco: Never  Substance and Sexual Activity   Alcohol use: Yes   Drug use: Never   Social Drivers of Health   Food Insecurity: No Food Insecurity (12/11/2022)   Received from [facility]   Hunger Vital Sign    Worried About Running Out of Food in the Last Year: Never true    Ran Out of Food in the Last Year: Never true  Transportation Needs: No Transportation Needs (12/11/2022)   Received from [facility]    Lack of Transportation (Medical): No    Lack of Transportation (Non-Medical): No    Objective:    Vitals:   04/07/23 1112 04/07/23 1113  BP: 122/88   Pulse: 83   Temp: 36.8 C (98.3 F)   SpO2: 98%   Weight: 69.3 kg (152 lb 12.8 oz)   Height: 172.7  cm (5\' 8" )   PainSc:  0-No pain     Exam Gen: NAD CV: RRR Pulm: CTA Abd: soft Rectal: Decreased rectal tone with moderate anal gape.  No prolapse with Valsalva. Patient then transported to bathroom and allowed to sit on the toilet.  He was able to initiate the prolapse there.  He appears to have approximately 8 to 10 cm of full-thickness rectal prolapse.  Labs, Imaging and Diagnostic Testing:  Procedure: Anoscopy Surgeon: Maisie Fus After the risks and benefits were explained, written consent was obtained for above procedure.  A medical assistant chaperone was present thoroughout the entire procedure.  Anesthesia: none Diagnosis: rectal prolapse Findings: Minimal hemorrhoid disease noted.  Assessment and Plan:  Diagnoses and all orders for this visit:  Rectal prolapse  Patient with full-thickness rectal prolapse, which  appears to be worsening over the past couple years.  On exam today he has moderate tone and good squeeze pressures.  We discussed that I typically treat this with a robotic assisted rectopexy.  We discussed the procedure in detail.  Risk include bleeding, infection, damage to adjacent structures (possible nerve damage that may result in urinary retention or sexual dysfunction), constipation, fecal incontinence and wound infections or hernias.  We addressed the likelihood that this would fix his prolapse and the risk of recurrence is approximately 10%.  All questions were answered.  Patient would like to proceed with this surgery when he is available to do so.     Charles Panda, MD Colon and Rectal Surgery [facility]

## 2023-05-01 NOTE — Progress Notes (Signed)
Anesthesia Review:  PCP: Cardiologist : Chest x-ray : 01/12/23- 2 view  EKG : 09/16/22  Echo : Stress test: 2018  Cardiac Cath :  Activity level:  Sleep Study/ CPAP : Fasting Blood Sugar :      / Checks Blood Sugar -- times a day:   Blood Thinner/ Instructions /Last Dose: ASA / Instructions/ Last Dose :

## 2023-05-01 NOTE — Patient Instructions (Signed)
SURGICAL WAITING ROOM VISITATION  Patients having surgery or a procedure may have no more than 2 support people in the waiting area - these visitors may rotate.    Children under the age of 78 must have an adult with them who is not the patient.  Due to an increase in RSV and influenza rates and associated hospitalizations, children ages 9 and under may not visit patients in [facility] hospitals.  If the patient needs to stay at the hospital during part of their recovery, the visitor guidelines for inpatient rooms apply. Pre-op nurse will coordinate an appropriate time for 1 support person to accompany patient in pre-op.  This support person may not rotate.    Please refer to the [facility] website for the visitor guidelines for Inpatients (after your surgery is over and you are in a regular room).       Your procedure is scheduled on:  05/14/2023    Report to [facility] Main Entrance    Report to admitting at   0645 AM   Call this number if you have problems the morning of surgery [phone]   Do not eat food :After Midnight.   After Midnight you may have the following liquids until _ 0545_____ AM  DAY OF SURGERY  Water Non-Citrus Juices (without pulp, NO RED-Apple, White grape, White cranberry) Black Coffee (NO MILK/CREAM OR CREAMERS, sugar ok)  Clear Tea (NO MILK/CREAM OR CREAMERS, sugar ok) regular and decaf                             Plain Jell-O (NO RED)                                           Fruit ices (not with fruit pulp, NO RED)                                     Popsicles (NO RED)                                                               Sports drinks like Gatorade (NO RED)              Drink 2 Ensure/G2 drinks AT 10:00 PM the night before surgery.        The day of surgery:  Drink ONE (1) Pre-Surgery Clear Ensure or G2 at  0545AM ( have completed by )  the morning of surgery. Drink in one sitting. Do not sip.  This drink was given to you  during your hospital  pre-op appointment visit. Nothing else to drink after completing the  Pre-Surgery Clear Ensure or G2.          If you have questions, please contact your surgeon's office.   FOLLOW BOWEL PREP AND ANY ADDITIONAL PRE OP INSTRUCTIONS YOU RECEIVED FROM YOUR SURGEON'S OFFICE!!!     Oral Hygiene is also important to reduce your risk of infection.  Remember - BRUSH YOUR TEETH THE MORNING OF SURGERY WITH YOUR REGULAR TOOTHPASTE  DENTURES WILL BE REMOVED PRIOR TO SURGERY PLEASE DO NOT APPLY "Poly grip" OR ADHESIVES!!!   Do NOT smoke after Midnight   Stop all vitamins and herbal supplements 7 days before surgery.   Take these medicines the morning of surgery with A SIP OF WATER:  none   DO NOT TAKE ANY ORAL DIABETIC MEDICATIONS DAY OF YOUR SURGERY  Bring CPAP mask and tubing day of surgery.                              You may not have any metal on your body including hair pins, jewelry, and body piercing             Do not wear make-up, lotions, powders, perfumes/cologne, or deodorant  Do not wear nail polish including gel and S&S, artificial/acrylic nails, or any other type of covering on natural nails including finger and toenails. If you have artificial nails, gel coating, etc. that needs to be removed by a nail salon please have this removed prior to surgery or surgery may need to be canceled/ delayed if the surgeon/ anesthesia feels like they are unable to be safely monitored.   Do not shave  48 hours prior to surgery.               Men may shave face and neck.   Do not bring valuables to the hospital. [facility] IS NOT             RESPONSIBLE   FOR VALUABLES.   Contacts, glasses, dentures or bridgework may not be worn into surgery.   Bring small overnight bag day of surgery.   DO NOT BRING YOUR HOME MEDICATIONS TO THE HOSPITAL. PHARMACY WILL DISPENSE MEDICATIONS LISTED ON YOUR MEDICATION LIST TO YOU DURING YOUR  ADMISSION IN THE HOSPITAL!    Patients discharged on the day of surgery will not be allowed to drive home.  Someone NEEDS to stay with you for the first 24 hours after anesthesia.   Special Instructions: Bring a copy of your healthcare power of attorney and living will documents the day of surgery if you haven't scanned them before.              Please read over the following fact sheets you were given: IF YOU HAVE QUESTIONS ABOUT YOUR PRE-OP INSTRUCTIONS PLEASE CALL [phone]   If you received a COVID test during your pre-op visit  it is requested that you wear a mask when out in public, stay away from anyone that may not be feeling well and notify your surgeon if you develop symptoms. If you test positive for Covid or have been in contact with anyone that has tested positive in the last 10 days please notify you surgeon.

## 2023-05-06 ENCOUNTER — Other Ambulatory Visit: Payer: Self-pay

## 2023-05-06 ENCOUNTER — Encounter (HOSPITAL_COMMUNITY)
Admission: RE | Admit: 2023-05-06 | Discharge: 2023-05-06 | Disposition: A | Discharge status: Home / Self Care | Payer: Medicare Other | Source: Ambulatory Visit | Attending: General Surgery | Admitting: General Surgery

## 2023-05-06 ENCOUNTER — Encounter (HOSPITAL_COMMUNITY): Payer: Self-pay

## 2023-05-06 VITALS — BP 118/70 | HR 74 | Temp 98.3°F | Resp 16 | Ht 68.0 in | Wt 150.0 lb

## 2023-05-06 DIAGNOSIS — Z01818 Encounter for other preprocedural examination: Secondary | ICD-10-CM | POA: Insufficient documentation

## 2023-05-06 LAB — BASIC METABOLIC PANEL
Anion gap: 8 (ref 5–15)
BUN: 18 mg/dL (ref 8–23)
CO2: 24 mmol/L (ref 22–32)
Calcium: 9.6 mg/dL (ref 8.9–10.3)
Chloride: 110 mmol/L (ref 98–111)
Creatinine, Ser: 1.01 mg/dL (ref 0.61–1.24)
GFR, Estimated: >60 mL/min (ref >60)
Glucose, Bld: 90 mg/dL (ref 70–99)
Potassium: 4 mmol/L (ref 3.5–5.1)
Sodium: 142 mmol/L (ref 135–145)

## 2023-05-06 LAB — CBC
HCT: 41.3 % (ref 39.0–52.0)
Hemoglobin: 13.5 g/dL (ref 13.0–17.0)
MCH: 31.3 pg (ref 26.0–34.0)
MCHC: 32.7 g/dL (ref 30.0–36.0)
MCV: 95.8 fL (ref 80.0–100.0)
Platelets: 277 10*3/uL (ref 150–400)
RBC: 4.31 MIL/uL (ref 4.22–5.81)
RDW: 14.1 % (ref 11.5–15.5)
WBC: 5 10*3/uL (ref 4.0–10.5)
nRBC: 0 % (ref 0.0–0.2)

## 2023-05-13 NOTE — H&P (Signed)
REFERRING PHYSICIAN:  Iva Boop, MD   PROVIDER:  Elenora Gamma, MD   MRN: [id number] DOB: 06-14-1940    Subjective    Chief Complaint: New Consultation       History of Present Illness: Charles Norton is a 82 y.o. male who is seen today as an office consultation at the request of Dr. Leone Payor for evaluation of New Consultation .  82 year old male with concern for rectal prolapse.  He has had many years of difficulty with constipation and prolapse that has to be manually reduced.  It is gotten worse over the past couple years.  He notes prolapsing tissue with bowel movements that is manually reduced.  He is using a fiber supplement on a daily basis.  He is underwent pelvic floor physical therapy as well and 2022.  His last colonoscopy was in 2020.  This showed some ulcerated mucosa in the distal rectum.     Review of Systems: A complete review of systems was obtained from the patient.  I have reviewed this information and discussed as appropriate with the patient.  See HPI as well for other ROS.     Medical History:     Past Medical History:  Diagnosis Date   Anemia     Arthritis        There is no problem list on file for this patient.          Past Surgical History:  Procedure Laterality Date   HERNIA REPAIR       JOINT REPLACEMENT       Shoulder surgery          No Known Allergies         Current Outpatient Medications on File Prior to Visit  Medication Sig Dispense Refill   ascorbic acid, vitamin C, (VITAMIN C) 1000 MG tablet Take 1,000 mg by mouth once daily       cholecalciferol (VITAMIN D3) 2,000 unit tablet Take 2,000 Units by mouth every morning       DOCOSAHEXAENOIC ACID ORAL Take 1 g by mouth once daily       docosahexaenoic acid/epa (FISH OIL ORAL) Take by mouth       flaxseed oiL Oil Take 1 capsule by mouth once daily       multivitamin tablet Take 1 tablet by mouth once daily       rosuvastatin (CRESTOR) 5 MG tablet Take 5 mg by mouth once  daily       TURMERIC ORAL Take 1,000 mg by mouth once daily        No current facility-administered medications on file prior to visit.      History reviewed. No pertinent family history.    Social History       Tobacco Use  Smoking Status Never  Smokeless Tobacco Never      Social History        Socioeconomic History   Marital status: Married  Tobacco Use   Smoking status: Never   Smokeless tobacco: Never  Substance and Sexual Activity   Alcohol use: Yes   Drug use: Never    Social Drivers of Health        Food Insecurity: No Food Insecurity (12/11/2022)    Received from [facility]    Hunger Vital Sign     Worried About Running Out of Food in the Last Year: Never true     Ran Out of Food in the Last Year: Never true  Transportation Needs:  No Transportation Needs (12/11/2022)    Received from [facility] - Transportation     Lack of Transportation (Medical): No     Lack of Transportation (Non-Medical): No      Objective:      Vitals:   05/14/23 0708  BP: 136/81  Pulse: 74  Resp: 16  Temp: (!) 97.5 F (36.4 C)  SpO2: 97%     Exam Gen: NAD CV: RRR Pulm: CTA Abd: soft Rectal: Decreased rectal tone with moderate anal gape.  No prolapse with Valsalva. Patient then transported to bathroom and allowed to sit on the toilet.  He was able to initiate the prolapse there.  He appears to have approximately 8 to 10 cm of full-thickness rectal prolapse.      Assessment and Plan:  Diagnoses and all orders for this visit:   Rectal prolapse   Patient with full-thickness rectal prolapse, which appears to be worsening over the past couple years.  On exam today he has moderate tone and good squeeze pressures.  We discussed that I typically treat this with a robotic assisted rectopexy.  We discussed the procedure in detail.  Risk include bleeding, infection, damage to adjacent structures (possible nerve damage that may result in urinary retention or sexual  dysfunction), constipation, fecal incontinence and wound infections or hernias.  We addressed the likelihood that this would fix his prolapse and the risk of recurrence is approximately 10%.  All questions were answered.  Patient would like to proceed with this surgery when he is available to do so.        Vanita Panda, MD Colon and Rectal Surgery [facility]

## 2023-05-14 ENCOUNTER — Inpatient Hospital Stay (HOSPITAL_COMMUNITY): Payer: Medicare Other | Admitting: Anesthesiology

## 2023-05-14 ENCOUNTER — Encounter (HOSPITAL_COMMUNITY): Payer: Self-pay | Admitting: General Surgery

## 2023-05-14 ENCOUNTER — Other Ambulatory Visit: Payer: Self-pay

## 2023-05-14 ENCOUNTER — Inpatient Hospital Stay (HOSPITAL_COMMUNITY)
Admission: RE | Admit: 2023-05-14 | Discharge: 2023-05-17 | DRG: 331 | Disposition: A | Discharge status: Home / Self Care | Payer: Medicare Other | Source: Ambulatory Visit | Attending: General Surgery | Admitting: General Surgery

## 2023-05-14 ENCOUNTER — Encounter (HOSPITAL_COMMUNITY)
Admission: RE | Disposition: A | Discharge status: Home / Self Care | Payer: Self-pay | Source: Ambulatory Visit | Attending: General Surgery

## 2023-05-14 DIAGNOSIS — D649 Anemia, unspecified: Secondary | ICD-10-CM | POA: Diagnosis present

## 2023-05-14 DIAGNOSIS — I10 Essential (primary) hypertension: Secondary | ICD-10-CM

## 2023-05-14 DIAGNOSIS — Z79899 Other long term (current) drug therapy: Secondary | ICD-10-CM | POA: Diagnosis not present

## 2023-05-14 DIAGNOSIS — K623 Rectal prolapse: Secondary | ICD-10-CM | POA: Diagnosis present

## 2023-05-14 DIAGNOSIS — E785 Hyperlipidemia, unspecified: Secondary | ICD-10-CM | POA: Diagnosis not present

## 2023-05-14 DIAGNOSIS — K5900 Constipation, unspecified: Secondary | ICD-10-CM | POA: Diagnosis present

## 2023-05-14 DIAGNOSIS — Z01818 Encounter for other preprocedural examination: Secondary | ICD-10-CM

## 2023-05-14 HISTORY — PX: XI ROBOT ASSISTED RECTOPEXY: SHX6788

## 2023-05-14 LAB — TYPE AND SCREEN
ABO/RH(D): O POS
Antibody Screen: POSITIVE
DAT, IgG: POSITIVE
Unit division: 0
Unit division: 0

## 2023-05-14 LAB — BPAM RBC
Blood Product Expiration Date: 202501132359
Blood Product Expiration Date: 202501132359
Unit Type and Rh: 5100
Unit Type and Rh: 5100

## 2023-05-14 SURGERY — RECTOPEXY, ROBOT-ASSISTED
Anesthesia: General

## 2023-05-14 MED ORDER — HYDRALAZINE HCL 20 MG/ML IJ SOLN
10.0000 mg | Freq: Once | INTRAMUSCULAR | Status: AC
  Administered 2023-05-14: 10 mg via INTRAVENOUS

## 2023-05-14 MED ORDER — ALVIMOPAN 12 MG PO CAPS
12.0000 mg | ORAL_CAPSULE | ORAL | Status: AC
  Administered 2023-05-14: 12 mg via ORAL
  Filled 2023-05-14: qty 1

## 2023-05-14 MED ORDER — ENSURE PRE-SURGERY PO LIQD: 592.0000 mL | Freq: Once | ORAL | Status: DC

## 2023-05-14 MED ORDER — PHENYLEPHRINE HCL (PRESSORS) 10 MG/ML IV SOLN
INTRAVENOUS | Status: AC
  Filled 2023-05-14: qty 1

## 2023-05-14 MED ORDER — AMISULPRIDE (ANTIEMETIC) 5 MG/2ML IV SOLN: 10.0000 mg | Freq: Once | INTRAVENOUS | Status: DC | PRN

## 2023-05-14 MED ORDER — SODIUM CHLORIDE 0.9 % IV SOLN
2.0000 g | INTRAVENOUS | Status: AC
  Administered 2023-05-14: 2 g via INTRAVENOUS
  Filled 2023-05-14: qty 2

## 2023-05-14 MED ORDER — SIMETHICONE 80 MG PO CHEW: 40.0000 mg | CHEWABLE_TABLET | Freq: Four times a day (QID) | ORAL | Status: DC | PRN

## 2023-05-14 MED ORDER — BUPIVACAINE LIPOSOME 1.3 % IJ SUSP: 20.0000 mL | Freq: Once | INTRAMUSCULAR | Status: DC

## 2023-05-14 MED ORDER — GLYCOPYRROLATE 0.2 MG/ML IJ SOLN
INTRAMUSCULAR | Status: DC | PRN
  Administered 2023-05-14: .2 mg via INTRAVENOUS

## 2023-05-14 MED ORDER — FENTANYL CITRATE (PF) 100 MCG/2ML IJ SOLN
INTRAMUSCULAR | Status: DC | PRN
  Administered 2023-05-14 (×2): 50 ug via INTRAVENOUS

## 2023-05-14 MED ORDER — SODIUM CHLORIDE 0.9 % IV SOLN
2.0000 g | Freq: Two times a day (BID) | INTRAVENOUS | Status: AC
  Administered 2023-05-14: 2 g via INTRAVENOUS
  Filled 2023-05-14: qty 2

## 2023-05-14 MED ORDER — BUPIVACAINE-EPINEPHRINE 0.25% -1:200000 IJ SOLN
INTRAMUSCULAR | Status: AC
  Filled 2023-05-14: qty 1

## 2023-05-14 MED ORDER — ROCURONIUM BROMIDE 100 MG/10ML IV SOLN
INTRAVENOUS | Status: DC | PRN
  Administered 2023-05-14: 20 mg via INTRAVENOUS
  Administered 2023-05-14: 60 mg via INTRAVENOUS

## 2023-05-14 MED ORDER — OXYCODONE HCL 5 MG PO TABS
ORAL_TABLET | ORAL | Status: AC
  Administered 2023-05-14: 5 mg via ORAL
  Filled 2023-05-14: qty 1

## 2023-05-14 MED ORDER — ACETAMINOPHEN 500 MG PO TABS
1000.0000 mg | ORAL_TABLET | Freq: Four times a day (QID) | ORAL | Status: DC
  Administered 2023-05-14 – 2023-05-17 (×11): 1000 mg via ORAL
  Filled 2023-05-14 (×11): qty 2

## 2023-05-14 MED ORDER — PROPOFOL 10 MG/ML IV BOLUS
INTRAVENOUS | Status: AC
  Filled 2023-05-14: qty 20

## 2023-05-14 MED ORDER — NUTRISOURCE FIBER PO PACK
1.0000 | PACK | Freq: Two times a day (BID) | ORAL | Status: DC
  Administered 2023-05-14 – 2023-05-17 (×6): 1 via ORAL
  Filled 2023-05-14 (×6): qty 1

## 2023-05-14 MED ORDER — PHENYLEPHRINE 80 MCG/ML (10ML) SYRINGE FOR IV PUSH (FOR BLOOD PRESSURE SUPPORT)
PREFILLED_SYRINGE | INTRAVENOUS | Status: AC
  Filled 2023-05-14: qty 10

## 2023-05-14 MED ORDER — ONDANSETRON HCL 4 MG/2ML IJ SOLN
4.0000 mg | Freq: Four times a day (QID) | INTRAMUSCULAR | Status: DC | PRN
Start: 2023-05-14 — End: 2023-05-17
  Administered 2023-05-14: 4 mg via INTRAVENOUS
  Filled 2023-05-14: qty 2

## 2023-05-14 MED ORDER — MAGNESIUM OXIDE -MG SUPPLEMENT 400 (240 MG) MG PO TABS
400.0000 mg | ORAL_TABLET | Freq: Every day | ORAL | Status: DC
  Administered 2023-05-15 – 2023-05-17 (×3): 400 mg via ORAL
  Filled 2023-05-14 (×4): qty 1

## 2023-05-14 MED ORDER — ROCURONIUM BROMIDE 10 MG/ML (PF) SYRINGE
PREFILLED_SYRINGE | INTRAVENOUS | Status: AC
  Filled 2023-05-14: qty 10

## 2023-05-14 MED ORDER — PHENYLEPHRINE HCL-NACL 20-0.9 MG/250ML-% IV SOLN
INTRAVENOUS | Status: DC | PRN
Start: 2023-05-14 — End: 2023-05-14
  Administered 2023-05-14: 40 ug/min via INTRAVENOUS

## 2023-05-14 MED ORDER — DIPHENHYDRAMINE HCL 12.5 MG/5ML PO ELIX: 12.5000 mg | ORAL_SOLUTION | Freq: Four times a day (QID) | ORAL | Status: DC | PRN

## 2023-05-14 MED ORDER — OXYCODONE HCL 5 MG/5ML PO SOLN: 5.0000 mg | Freq: Once | ORAL | Status: AC | PRN

## 2023-05-14 MED ORDER — ENOXAPARIN SODIUM 40 MG/0.4ML IJ SOSY
40.0000 mg | PREFILLED_SYRINGE | INTRAMUSCULAR | Status: DC
  Administered 2023-05-15 – 2023-05-17 (×3): 40 mg via SUBCUTANEOUS
  Filled 2023-05-14 (×3): qty 0.4

## 2023-05-14 MED ORDER — HYDROMORPHONE HCL 1 MG/ML IJ SOLN: 0.5000 mg | INTRAMUSCULAR | Status: DC | PRN

## 2023-05-14 MED ORDER — ORAL CARE MOUTH RINSE: 15.0000 mL | Freq: Once | OROMUCOSAL | Status: AC

## 2023-05-14 MED ORDER — HYDROMORPHONE HCL 1 MG/ML IJ SOLN
INTRAMUSCULAR | Status: AC
  Administered 2023-05-14: 0.5 mg via INTRAVENOUS
  Filled 2023-05-14: qty 1

## 2023-05-14 MED ORDER — SODIUM CHLORIDE 0.9 % IV SOLN: 12.5000 mg | INTRAVENOUS | Status: DC | PRN

## 2023-05-14 MED ORDER — DIPHENHYDRAMINE HCL 50 MG/ML IJ SOLN: 12.5000 mg | Freq: Four times a day (QID) | INTRAMUSCULAR | Status: DC | PRN

## 2023-05-14 MED ORDER — BUPIVACAINE LIPOSOME 1.3 % IJ SUSP
INTRAMUSCULAR | Status: AC
  Filled 2023-05-14: qty 20

## 2023-05-14 MED ORDER — FENTANYL CITRATE (PF) 100 MCG/2ML IJ SOLN
INTRAMUSCULAR | Status: AC
  Filled 2023-05-14: qty 2

## 2023-05-14 MED ORDER — POLYVINYL ALCOHOL 1.4 % OP SOLN
1.0000 [drp] | Freq: Every day | OPHTHALMIC | Status: DC
  Administered 2023-05-15 – 2023-05-17 (×3): 1 [drp] via OPHTHALMIC
  Filled 2023-05-14: qty 15

## 2023-05-14 MED ORDER — ENSURE PRE-SURGERY PO LIQD: 296.0000 mL | Freq: Once | ORAL | Status: DC

## 2023-05-14 MED ORDER — BUPIVACAINE LIPOSOME 1.3 % IJ SUSP
INTRAMUSCULAR | Status: DC | PRN
  Administered 2023-05-14: 20 mL

## 2023-05-14 MED ORDER — LACTATED RINGERS IV SOLN: INTRAVENOUS | Status: DC | PRN

## 2023-05-14 MED ORDER — GABAPENTIN 300 MG PO CAPS
300.0000 mg | ORAL_CAPSULE | Freq: Two times a day (BID) | ORAL | Status: DC
  Administered 2023-05-14 – 2023-05-17 (×6): 300 mg via ORAL
  Filled 2023-05-14 (×4): qty 1
  Filled 2023-05-14: qty 3
  Filled 2023-05-14: qty 1

## 2023-05-14 MED ORDER — ROSUVASTATIN CALCIUM 5 MG PO TABS
5.0000 mg | ORAL_TABLET | Freq: Every day | ORAL | Status: DC
  Administered 2023-05-14 – 2023-05-16 (×3): 5 mg via ORAL
  Filled 2023-05-14 (×3): qty 1

## 2023-05-14 MED ORDER — ENSURE SURGERY PO LIQD
237.0000 mL | Freq: Two times a day (BID) | ORAL | Status: DC
  Administered 2023-05-14 – 2023-05-16 (×4): 237 mL via ORAL

## 2023-05-14 MED ORDER — LACTATED RINGERS IV SOLN: INTRAVENOUS | Status: DC

## 2023-05-14 MED ORDER — ALVIMOPAN 12 MG PO CAPS
12.0000 mg | ORAL_CAPSULE | Freq: Two times a day (BID) | ORAL | Status: DC
  Administered 2023-05-15 – 2023-05-17 (×5): 12 mg via ORAL
  Filled 2023-05-14 (×5): qty 1

## 2023-05-14 MED ORDER — PHENYLEPHRINE HCL (PRESSORS) 10 MG/ML IV SOLN
INTRAVENOUS | Status: DC | PRN
Start: 2023-05-14 — End: 2023-05-14
  Administered 2023-05-14: 80 ug via INTRAVENOUS

## 2023-05-14 MED ORDER — KCL IN DEXTROSE-NACL 20-5-0.45 MEQ/L-%-% IV SOLN
INTRAVENOUS | Status: DC
  Filled 2023-05-14 (×2): qty 1000

## 2023-05-14 MED ORDER — ACETAMINOPHEN 500 MG PO TABS
1000.0000 mg | ORAL_TABLET | ORAL | Status: AC
  Administered 2023-05-14: 1000 mg via ORAL
  Filled 2023-05-14: qty 2

## 2023-05-14 MED ORDER — GABAPENTIN 300 MG PO CAPS
300.0000 mg | ORAL_CAPSULE | ORAL | Status: AC
Start: 2023-05-14 — End: 2023-05-14
  Administered 2023-05-14: 300 mg via ORAL
  Filled 2023-05-14: qty 1

## 2023-05-14 MED ORDER — POLYETHYLENE GLYCOL 3350 17 GM/SCOOP PO POWD: 1.0000 | Freq: Once | ORAL | Status: DC

## 2023-05-14 MED ORDER — ALUM & MAG HYDROXIDE-SIMETH 200-200-20 MG/5ML PO SUSP: 30.0000 mL | Freq: Four times a day (QID) | ORAL | Status: DC | PRN

## 2023-05-14 MED ORDER — TRAMADOL HCL 50 MG PO TABS: 50.0000 mg | ORAL_TABLET | Freq: Four times a day (QID) | ORAL | Status: DC | PRN

## 2023-05-14 MED ORDER — LACTATED RINGERS IR SOLN
Status: DC | PRN
  Administered 2023-05-14: 1000 mL

## 2023-05-14 MED ORDER — 0.9 % SODIUM CHLORIDE (POUR BTL) OPTIME
TOPICAL | Status: DC | PRN
  Administered 2023-05-14: 1000 mL

## 2023-05-14 MED ORDER — CHLORHEXIDINE GLUCONATE 0.12 % MT SOLN
15.0000 mL | Freq: Once | OROMUCOSAL | Status: AC
  Administered 2023-05-14: 15 mL via OROMUCOSAL

## 2023-05-14 MED ORDER — SACCHAROMYCES BOULARDII 250 MG PO CAPS
250.0000 mg | ORAL_CAPSULE | Freq: Two times a day (BID) | ORAL | Status: DC
  Administered 2023-05-14 – 2023-05-17 (×7): 250 mg via ORAL
  Filled 2023-05-14 (×7): qty 1

## 2023-05-14 MED ORDER — OXYCODONE HCL 5 MG PO TABS: 5.0000 mg | ORAL_TABLET | Freq: Once | ORAL | Status: AC | PRN

## 2023-05-14 MED ORDER — HEPARIN SODIUM (PORCINE) 5000 UNIT/ML IJ SOLN
5000.0000 [IU] | Freq: Once | INTRAMUSCULAR | Status: AC
  Administered 2023-05-14: 5000 [IU] via SUBCUTANEOUS
  Filled 2023-05-14: qty 1

## 2023-05-14 MED ORDER — MIDAZOLAM HCL 2 MG/2ML IJ SOLN
INTRAMUSCULAR | Status: AC
  Filled 2023-05-14: qty 2

## 2023-05-14 MED ORDER — SUGAMMADEX SODIUM 200 MG/2ML IV SOLN
INTRAVENOUS | Status: DC | PRN
  Administered 2023-05-14: 200 mg via INTRAVENOUS

## 2023-05-14 MED ORDER — LIDOCAINE HCL (CARDIAC) PF 100 MG/5ML IV SOSY
PREFILLED_SYRINGE | INTRAVENOUS | Status: DC | PRN
  Administered 2023-05-14: 60 mg via INTRAVENOUS

## 2023-05-14 MED ORDER — HYDROMORPHONE HCL 1 MG/ML IJ SOLN
INTRAMUSCULAR | Status: AC
  Filled 2023-05-14: qty 1

## 2023-05-14 MED ORDER — PROPOFOL 10 MG/ML IV BOLUS
INTRAVENOUS | Status: DC | PRN
  Administered 2023-05-14: 150 mg via INTRAVENOUS

## 2023-05-14 MED ORDER — HYDROMORPHONE HCL 1 MG/ML IJ SOLN
0.2500 mg | INTRAMUSCULAR | Status: DC | PRN
  Administered 2023-05-14 (×2): 0.5 mg via INTRAVENOUS
  Administered 2023-05-14 (×2): 0.25 mg via INTRAVENOUS

## 2023-05-14 MED ORDER — BISACODYL 5 MG PO TBEC: 20.0000 mg | DELAYED_RELEASE_TABLET | Freq: Once | ORAL | Status: DC

## 2023-05-14 MED ORDER — HYDRALAZINE HCL 20 MG/ML IJ SOLN
INTRAMUSCULAR | Status: AC
  Filled 2023-05-14: qty 1

## 2023-05-14 MED ORDER — ONDANSETRON HCL 4 MG PO TABS: 4.0000 mg | ORAL_TABLET | Freq: Four times a day (QID) | ORAL | Status: DC | PRN

## 2023-05-14 MED ORDER — BUPIVACAINE-EPINEPHRINE 0.25% -1:200000 IJ SOLN
INTRAMUSCULAR | Status: DC | PRN
  Administered 2023-05-14: 30 mL

## 2023-05-14 SURGICAL SUPPLY — 56 items
ANTIFOG SOL W/FOAM PAD STRL (MISCELLANEOUS) ×1
BAG COUNTER SPONGE SURGICOUNT (BAG) ×1 IMPLANT
BLADE SURG 15 STRL LF DISP TIS (BLADE) ×1 IMPLANT
COVER MAYO STAND STRL (DRAPES) ×1 IMPLANT
COVER SURGICAL LIGHT HANDLE (MISCELLANEOUS) ×1 IMPLANT
COVER TIP SHEARS 8 DVNC (MISCELLANEOUS) ×1 IMPLANT
DERMABOND ADVANCED .7 DNX12 (GAUZE/BANDAGES/DRESSINGS) ×2 IMPLANT
DRAIN CHANNEL 10M FLAT 3/4 FLT (DRAIN) IMPLANT
DRAIN CHANNEL 19F RND (DRAIN) IMPLANT
DRAPE ARM DVNC X/XI (DISPOSABLE) ×4 IMPLANT
DRAPE COLUMN DVNC XI (DISPOSABLE) ×1 IMPLANT
DRAPE SURG IRRIG POUCH 19X23 (DRAPES) ×1 IMPLANT
DRIVER NDL LRG 8 DVNC XI (INSTRUMENTS) ×2 IMPLANT
DRIVER NDLE LRG 8 DVNC XI (INSTRUMENTS) ×2
ELECT PENCIL ROCKER SW 15FT (MISCELLANEOUS) IMPLANT
ELECT REM PT RETURN 15FT ADLT (MISCELLANEOUS) ×1 IMPLANT
EVACUATOR SILICONE 100CC (DRAIN) IMPLANT
GLOVE BIO SURGEON STRL SZ 6.5 (GLOVE) ×2 IMPLANT
GLOVE INDICATOR 6.5 STRL GRN (GLOVE) ×3 IMPLANT
GOWN STRL REUS W/ TWL XL LVL3 (GOWN DISPOSABLE) ×2 IMPLANT
GRASPER TIP-UP FEN DVNC XI (INSTRUMENTS) ×1 IMPLANT
HOLDER FOLEY CATH W/STRAP (MISCELLANEOUS) ×1 IMPLANT
IRRIG SUCT STRYKERFLOW 2 WTIP (MISCELLANEOUS) ×1
IRRIGATION SUCT STRKRFLW 2 WTP (MISCELLANEOUS) ×1 IMPLANT
KIT BASIN OR (CUSTOM PROCEDURE TRAY) ×1 IMPLANT
KIT TURNOVER KIT A (KITS) IMPLANT
LEGGING LITHOTOMY PAIR STRL (DRAPES) ×1 IMPLANT
LUBRICANT JELLY K Y 4OZ (MISCELLANEOUS) ×1 IMPLANT
NDL HYPO 22X1.5 SAFETY MO (MISCELLANEOUS) ×1 IMPLANT
NDL INSUFFLATION 14GA 120MM (NEEDLE) ×1 IMPLANT
NEEDLE HYPO 22X1.5 SAFETY MO (MISCELLANEOUS) ×1
NEEDLE INSUFFLATION 14GA 120MM (NEEDLE) ×1
PACK CARDIOVASCULAR III (CUSTOM PROCEDURE TRAY) ×1 IMPLANT
PAD POSITIONING PINK XL (MISCELLANEOUS) ×1 IMPLANT
PROTECTOR NERVE ULNAR (MISCELLANEOUS) ×2 IMPLANT
SCISSORS LAP 5X45 EPIX DISP (ENDOMECHANICALS) ×1 IMPLANT
SCISSORS MNPLR CVD DVNC XI (INSTRUMENTS) ×1 IMPLANT
SEAL UNIV 5-12 XI (MISCELLANEOUS) ×3 IMPLANT
SEALER VESSEL EXT DVNC XI (MISCELLANEOUS) IMPLANT
SOL ELECTROSURG ANTI STICK (MISCELLANEOUS) ×1
SOLUTION ANTFG W/FOAM PAD STRL (MISCELLANEOUS) ×1 IMPLANT
SOLUTION ELECTROSURG ANTI STCK (MISCELLANEOUS) ×1 IMPLANT
SPIKE FLUID TRANSFER (MISCELLANEOUS) ×1 IMPLANT
SUT ETHIBOND 0 36 GRN (SUTURE) IMPLANT
SUT ETHIBOND 2 0 SH 36X2 (SUTURE) ×1 IMPLANT
SUT ETHILON 2 0 PS N (SUTURE) IMPLANT
SUT VIC AB 4-0 PS2 18 (SUTURE) ×1 IMPLANT
SUT VLOC 180 2-0 6IN GS21 (SUTURE) ×2 IMPLANT
SYR 20ML ECCENTRIC (SYRINGE) ×1 IMPLANT
SYR CONTROL 10ML LL (SYRINGE) ×1 IMPLANT
TOWEL OR 17X26 10 PK STRL BLUE (TOWEL DISPOSABLE) ×1 IMPLANT
TRAY FOLEY MTR SLVR 14FR STAT (SET/KITS/TRAYS/PACK) ×1 IMPLANT
TRAY FOLEY MTR SLVR 16FR STAT (SET/KITS/TRAYS/PACK) ×1 IMPLANT
TROCAR ADV FIXATION 5X100MM (TROCAR) ×1 IMPLANT
TUBING CONNECTING 10 (TUBING) IMPLANT
TUBING INSUFFLATION 10FT LAP (TUBING) ×1 IMPLANT

## 2023-05-14 NOTE — Op Note (Signed)
05/14/2023  11:22 AM  PATIENT:  Charles Norton  82 y.o. male  Patient Care Team: Wilfrid Lund, PA as PCP - General (Family Medicine)  PRE-OPERATIVE DIAGNOSIS:  Rectal prolapse  POST-OPERATIVE DIAGNOSIS:  Rectal prolapse  PROCEDURE:  XI ROBOT ASSISTED RECTOPEXY   Surgeon(s): Romie Levee, MD Andria Meuse, MD  ASSISTANT: Dr Cliffton Asters   ANESTHESIA:   local and general  EBL: 25ml Total I/O In: 600 [I.V.:500; IV Piggyback:100] Out: -   Delay start of Pharmacological VTE agent (>24hrs) due to surgical blood loss or risk of bleeding:  no  DRAINS: (61F) Jackson-Pratt drain(s) with closed bulb suction in the pelvis    SPECIMEN:  none  DISPOSITION OF SPECIMEN:  PATHOLOGY  COUNTS:  YES  PLAN OF CARE: Admit to inpatient   PATIENT DISPOSITION:  PACU - hemodynamically stable.  INDICATION:    82 y.o. M with rectal prolapse.  I recommended rectopexy:  The anatomy & physiology of the digestive tract was discussed.  The pathophysiology was discussed.  Natural history risks without surgery was discussed.   I worked to give an overview of the disease and the frequent need to have multispecialty involvement.  I feel the risks of no intervention will lead to serious problems that outweigh the operative risks; therefore, I recommended a partial colectomy to remove the pathology.  Laparoscopic & open techniques were discussed.   Risks such as bleeding, infection, abscess, leak, reoperation, possible ostomy, hernia, heart attack, death, and other risks were discussed.  I noted a good likelihood this will help address the problem.   Goals of post-operative recovery were discussed as well.    The patient expressed understanding & wished to proceed with surgery.  OR FINDINGS:   Patient had full thickness rectal prolapse    DESCRIPTION:   Informed consent was confirmed.  The patient underwent general anaesthesia without difficulty.  The patient was positioned appropriately.  VTE  prevention in place.  The patient's abdomen was clipped, prepped, & draped in a sterile fashion.  Surgical timeout confirmed our plan.  The patient was positioned in reverse Trendelenburg.  Abdominal entry was gained using a Varies needle in the LUQ.  Entry was clean.  I induced carbon dioxide insufflation.  An 8mm robotic port was placed in the RUQ.  Camera inspection revealed no injury.  Extra ports were carefully placed under direct laparoscopic visualization.  I laparoscopically reflected the greater omentum and the upper abdomen the small bowel in the upper abdomen. The patient was appropriately positioned and the robot was docked to the patient's left side.  Instruments were placed under direct visualization.      I elevated the sigmoid mesentery and enetered into the retro-mesenteric plane. We were able to identify the left ureter and gonadal vessels. We kept those posterior within the retroperitoneum and elevated the left colon mesentery off that. I did isolated IMA pedicle but did not ligate it yet.  I continued distally and got into the avascular plane posterior to the mesorectum. This allowed me to help mobilize the rectum as well by freeing the mesorectum off the sacrum.  I mobilized the peritoneal coverings towards the peritoneal reflection on both the right and left sides of the rectum.  I could see the right and left ureters and stayed away from them.  I continued down into the mesorectal plane dissecting this out down to the level of the pelvic floor on the right and left sides.  I continued my dissection into the  peritoneal reflection but did not dissect out the anterior plane.  Once the peritoneal reflection could reach the sacral promontory, I evaluated for complete reduction of his prolapse.  The distal rectum felt taught.  I then placed a 2-0 V suture into the sacral promontory and fixed this to the peritoneal reflection to allow for mobilization of the rectum up to the sacral promontory.  I  then used 3, 2-0 Ethibond sutures to perform the rectopexy to the sacral prominence.  I then used a V-Loc to close the peritoneal reflection distally.  I used a remnant Ethibond suture to affix the sigmoid to the left lateral sidewall.  I then used the remaining 2-0 V-Loc suture to close the remaining peritoneal reflection.  A 19 French drain was placed into the pelvis and brought out through the right lower quadrant port site.  This was secured into place with a 2-0 nylon suture.  I inspected all 4 quadrants of the abdomen.  There was no active bleeding.  There was no sign of injury.  At this point the robot was undocked.  The remaining port sites were closed using 4-0 Vicryl suture and Dermabond.  The patient was then awakened from anesthesia and sent to the postanesthesia care unit in stable condition.  All counts were correct per operating room staff.    An MD assistant was necessary for tissue manipulation, retraction and positioning due to the complexity of the case and hospital policies     Vanita Panda, MD  Colorectal and General Surgery [facility]

## 2023-05-14 NOTE — Anesthesia Preprocedure Evaluation (Signed)
Anesthesia Evaluation  Patient identified by MRN, date of birth, ID band Patient awake    Reviewed: Allergy & Precautions, NPO status , Patient's Chart, lab work & pertinent test results  History of Anesthesia Complications (+) PONV and history of anesthetic complications  Airway Mallampati: II  TM Distance: >3 FB Neck ROM: Full    Dental  (+) Teeth Intact, Dental Advisory Given   Pulmonary neg pulmonary ROS   Pulmonary exam normal breath sounds clear to auscultation       Cardiovascular hypertension, Pt. on medications Normal cardiovascular exam Rhythm:Regular Rate:Normal     Neuro/Psych negative neurological ROS     GI/Hepatic negative GI ROS, Neg liver ROS,,,  Endo/Other  negative endocrine ROS    Renal/GU negative Renal ROS     Musculoskeletal  (+) Arthritis , Osteoarthritis,  Status post resection of left total knee arthroplasty and removal of antibiotic spacer   Abdominal   Peds  Hematology  (+) Blood dyscrasia, anemia Plt 309k   Anesthesia Other Findings   Reproductive/Obstetrics                             Anesthesia Physical Anesthesia Plan  ASA: 3  Anesthesia Plan: General   Post-op Pain Management: Tylenol PO (pre-op)* and Gabapentin PO (pre-op)*   Induction: Intravenous  PONV Risk Score and Plan: 3 and Dexamethasone, Ondansetron, Midazolam and Treatment may vary due to age or medical condition  Airway Management Planned: Oral ETT  Additional Equipment:   Intra-op Plan:   Post-operative Plan: Extubation in OR  Informed Consent: I have reviewed the patients History and Physical, chart, labs and discussed the procedure including the risks, benefits and alternatives for the proposed anesthesia with the patient or authorized representative who has indicated his/her understanding and acceptance.     Dental advisory given  Plan Discussed with: CRNA  Anesthesia  Plan Comments:         Anesthesia Quick Evaluation

## 2023-05-14 NOTE — Transfer of Care (Signed)
Immediate Anesthesia Transfer of Care Note  Patient: Charles Norton  Procedure(s) Performed: XI ROBOT ASSISTED RECTOPEXY  Patient Location: PACU  Anesthesia Type:General  Level of Consciousness: drowsy  Airway & Oxygen Therapy: Patient Spontanous Breathing and Patient connected to face mask  Post-op Assessment: Report given to RN and Post -op Vital signs reviewed and stable  Post vital signs: Reviewed and stable  Last Vitals:  Vitals Value Taken Time  BP 154/86 05/14/23 1139  Temp    Pulse 76 05/14/23 1141  Resp 16 05/14/23 1141  SpO2 100 % 05/14/23 1141  Vitals shown include unfiled device data.  Last Pain:  Vitals:   05/14/23 0714  TempSrc:   PainSc: 0-No pain         Complications: No notable events documented.

## 2023-05-14 NOTE — Plan of Care (Signed)
  Problem: Bowel/Gastric: Goal: Gastrointestinal status for postoperative course will improve Outcome: Progressing   Problem: Health Behavior/Discharge Planning: Goal: Identification of community resources to assist with postoperative recovery needs will improve Outcome: Progressing

## 2023-05-14 NOTE — Anesthesia Postprocedure Evaluation (Signed)
Anesthesia Post Note  Patient: Charles Norton  Procedure(s) Performed: XI ROBOT ASSISTED RECTOPEXY     Patient location during evaluation: PACU Anesthesia Type: General Level of consciousness: awake and alert Pain management: pain level controlled Vital Signs Assessment: post-procedure vital signs reviewed and stable Respiratory status: spontaneous breathing, nonlabored ventilation and respiratory function stable Cardiovascular status: blood pressure returned to baseline and stable Postop Assessment: no apparent nausea or vomiting Anesthetic complications: no   No notable events documented.  Last Vitals:  Vitals:   05/14/23 1300 05/14/23 1324  BP: (!) 152/87 (!) 151/82  Pulse: 69 66  Resp: 12 16  Temp:  (!) 36.3 C  SpO2: 99% 99%    Last Pain:  Vitals:   05/14/23 1324  TempSrc: Oral  PainSc:                  Lowella Curb

## 2023-05-14 NOTE — Anesthesia Procedure Notes (Signed)
Procedure Name: Intubation Date/Time: 05/14/2023 9:39 AM  Performed by: Nathen May, CRNAPre-anesthesia Checklist: Patient identified, Emergency Drugs available, Suction available and Patient being monitored Patient Re-evaluated:Patient Re-evaluated prior to induction Oxygen Delivery Method: Circle System Utilized Preoxygenation: Pre-oxygenation with 100% oxygen Induction Type: IV induction Ventilation: Mask ventilation without difficulty Laryngoscope Size: Mac and 3 Grade View: Grade I Tube type: Oral Tube size: 7.5 mm Number of attempts: 1 Airway Equipment and Method: Stylet and Oral airway Placement Confirmation: ETT inserted through vocal cords under direct vision, positive ETCO2 and breath sounds checked- equal and bilateral Secured at: 23 cm Tube secured with: Tape Dental Injury: Teeth and Oropharynx as per pre-operative assessment

## 2023-05-15 ENCOUNTER — Encounter (HOSPITAL_COMMUNITY): Payer: Self-pay | Admitting: General Surgery

## 2023-05-15 LAB — BASIC METABOLIC PANEL
Anion gap: 6 (ref 5–15)
BUN: 16 mg/dL (ref 8–23)
CO2: 23 mmol/L (ref 22–32)
Calcium: 8.7 mg/dL — ABNORMAL LOW (ref 8.9–10.3)
Chloride: 103 mmol/L (ref 98–111)
Creatinine, Ser: 1.06 mg/dL (ref 0.61–1.24)
GFR, Estimated: >60 mL/min (ref >60)
Glucose, Bld: 154 mg/dL — ABNORMAL HIGH (ref 70–99)
Potassium: 4.3 mmol/L (ref 3.5–5.1)
Sodium: 132 mmol/L — ABNORMAL LOW (ref 135–145)

## 2023-05-15 LAB — CBC
HCT: 35.4 % — ABNORMAL LOW (ref 39.0–52.0)
Hemoglobin: 11.7 g/dL — ABNORMAL LOW (ref 13.0–17.0)
MCH: 31.8 pg (ref 26.0–34.0)
MCHC: 33.1 g/dL (ref 30.0–36.0)
MCV: 96.2 fL (ref 80.0–100.0)
Platelets: 261 10*3/uL (ref 150–400)
RBC: 3.68 MIL/uL — ABNORMAL LOW (ref 4.22–5.81)
RDW: 14.1 % (ref 11.5–15.5)
WBC: 10 10*3/uL (ref 4.0–10.5)
nRBC: 0 % (ref 0.0–0.2)

## 2023-05-15 MED ORDER — POLYETHYLENE GLYCOL 3350 17 G PO PACK
17.0000 g | PACK | Freq: Every day | ORAL | Status: DC
Start: 2023-05-15 — End: 2023-05-16
  Administered 2023-05-15: 17 g via ORAL
  Filled 2023-05-15: qty 1

## 2023-05-15 MED ORDER — CHLORHEXIDINE GLUCONATE CLOTH 2 % EX PADS
6.0000 | MEDICATED_PAD | Freq: Every day | CUTANEOUS | Status: DC
  Administered 2023-05-15 – 2023-05-16 (×2): 6 via TOPICAL

## 2023-05-15 NOTE — Progress Notes (Signed)
1 Day Post-Op Robotic Rectopexy Subjective: Had one episode of emesis last night, otherwise no nausea.  Pain controlled  Objective: Vital signs in last 24 hours: Temp:  [97.3 F (36.3 C)-97.5 F (36.4 C)] 97.5 F (36.4 C) (12/20 0540) Pulse Rate:  [61-80] 72 (12/20 0540) Resp:  [10-18] 18 (12/20 0540) BP: (113-166)/(61-90) 113/61 (12/20 0540) SpO2:  [93 %-100 %] 98 % (12/20 0540) Weight:  [71.5 kg] 71.5 kg (12/20 0500)   Intake/Output from previous day: 12/19 0701 - 12/20 0700 In: 2323.3 [P.O.:580; I.V.:1643.3; IV Piggyback:100] Out: 2025 [Urine:2000; Blood:25] Intake/Output this shift: Total I/O In: -  Out: 405 [Urine:400; Drains:5]   General appearance: alert and cooperative GI: soft, non-distended  Incision: no significant drainage  Lab Results:  Recent Labs    05/15/23 0421  WBC 10.0  HGB 11.7*  HCT 35.4*  PLT 261   BMET Recent Labs    05/15/23 0421  NA 132*  K 4.3  CL 103  CO2 23  GLUCOSE 154*  BUN 16  CREATININE 1.06  CALCIUM 8.7*   PT/INR No results for input(s): "LABPROT", "INR" in the last 72 hours. ABG No results for input(s): "PHART", "HCO3" in the last 72 hours.  Invalid input(s): "PCO2", "PO2"  MEDS, Scheduled  acetaminophen  1,000 mg Oral Q6H   alvimopan  12 mg Oral BID   enoxaparin (LOVENOX) injection  40 mg Subcutaneous Q24H   feeding supplement  237 mL Oral BID BM   fiber  1 packet Oral BID   gabapentin  300 mg Oral BID   magnesium oxide  400 mg Oral Daily   polyvinyl alcohol  1 drop Both Eyes Daily   rosuvastatin  5 mg Oral QHS   saccharomyces boulardii  250 mg Oral BID    Studies/Results: No results found.  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/2022   AKI (acute kidney injury) (HCC) 09/17/2022   Generalized weakness 09/17/2022   Dehydration 09/17/2022   Pain and swelling of knee, left 09/17/2022   SIRS (systemic inflammatory response syndrome) (HCC) 09/17/2022   BPH (benign prostatic hyperplasia) 09/17/2022   Essential hypertension 09/17/2022   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

## 2023-05-15 NOTE — Progress Notes (Signed)
   05/15/23 0910  TOC Brief Assessment  Insurance and Status Reviewed  Patient has primary care physician Yes  Home environment has been reviewed Resides with spouse  Prior level of function: Independent at baseline  Prior/Current Home Services No current home services  Social Drivers of Health Review SDOH reviewed no interventions necessary  Readmission risk has been reviewed Yes  Transition of care needs no transition of care needs at this time

## 2023-05-15 NOTE — Progress Notes (Signed)
Mobility Specialist - Progress Note   05/15/23 0858  Mobility  Activity Ambulated with assistance in hallway  Level of Assistance Independent after set-up  Assistive Device Other (Comment) (IV Pole)  Distance Ambulated (ft) 500 ft  Activity Response Tolerated well  Mobility Referral Yes  Mobility visit 1 Mobility  Mobility Specialist Start Time (ACUTE ONLY) 0845  Mobility Specialist Stop Time (ACUTE ONLY) 0857  Mobility Specialist Time Calculation (min) (ACUTE ONLY) 12 min   Pt received in bed and agreeable to mobility. No complaints during session. Pt to recliner after session with all needs met.    [facility]

## 2023-05-16 MED ORDER — POLYETHYLENE GLYCOL 3350 17 G PO PACK
17.0000 g | PACK | Freq: Two times a day (BID) | ORAL | Status: DC
  Administered 2023-05-16 – 2023-05-17 (×3): 17 g via ORAL
  Filled 2023-05-16 (×3): qty 1

## 2023-05-16 NOTE — Progress Notes (Signed)
2 Days Post-Op Robotic Rectopexy Subjective:  no nausea. Passing flatus.  Tolerating a diet but feels a bit bloated at times.  Ambulating well. Pain controlled  Objective: Vital signs in last 24 hours: Temp:  [97.9 F (36.6 C)-98.4 F (36.9 C)] 98.4 F (36.9 C) (12/21 0609) Pulse Rate:  [61-78] 78 (12/21 0609) Resp:  [18] 18 (12/21 0609) BP: (122-142)/(62-78) 142/78 (12/21 0609) SpO2:  [97 %-100 %] 97 % (12/21 0609)   Intake/Output from previous day: 12/20 0701 - 12/21 0700 In: 900 [P.O.:900] Out: 3225 [Urine:3200; Drains:25] Intake/Output this shift: No intake/output data recorded.   General appearance: alert and cooperative GI: soft, non-distended  Incision: no significant drainage  Lab Results:  Recent Labs    05/15/23 0421  WBC 10.0  HGB 11.7*  HCT 35.4*  PLT 261   BMET Recent Labs    05/15/23 0421  NA 132*  K 4.3  CL 103  CO2 23  GLUCOSE 154*  BUN 16  CREATININE 1.06  CALCIUM 8.7*   PT/INR No results for input(s): "LABPROT", "INR" in the last 72 hours. ABG No results for input(s): "PHART", "HCO3" in the last 72 hours.  Invalid input(s): "PCO2", "PO2"  MEDS, Scheduled  acetaminophen  1,000 mg Oral Q6H   alvimopan  12 mg Oral BID   Chlorhexidine Gluconate Cloth  6 each Topical Daily   enoxaparin (LOVENOX) injection  40 mg Subcutaneous Q24H   feeding supplement  237 mL Oral BID BM   fiber  1 packet Oral BID   gabapentin  300 mg Oral BID   magnesium oxide  400 mg Oral Daily   polyethylene glycol  17 g Oral Daily   polyvinyl alcohol  1 drop Both Eyes Daily   rosuvastatin  5 mg Oral QHS   saccharomyces boulardii  250 mg Oral BID    Studies/Results: No results found.  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   AKI (acute kidney injury) (HCC) 09/17/2022   Generalized weakness 09/17/2022   Dehydration 09/17/2022   Pain and swelling of knee, left 09/17/2022   SIRS (systemic inflammatory response syndrome) (HCC) 09/17/2022   BPH (benign prostatic hyperplasia) 09/17/2022   Essential hypertension 09/17/2022   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

## 2023-05-16 NOTE — Plan of Care (Signed)
  Problem: Education: Goal: Understanding of discharge needs will improve Outcome: Progressing   Problem: Activity: Goal: Ability to tolerate increased activity will improve Outcome: Progressing   Problem: Health Behavior/Discharge Planning: Goal: Identification of community resources to assist with postoperative recovery needs will improve Outcome: Progressing   Problem: Nutritional: Goal: Will attain and maintain optimal nutritional status will improve Outcome: Progressing

## 2023-05-17 MED ORDER — POLYETHYLENE GLYCOL 3350 17 G PO PACK: 17.0000 g | PACK | Freq: Two times a day (BID) | ORAL | 0 refills | Status: AC

## 2023-05-17 MED ORDER — ACETAMINOPHEN 500 MG PO TABS: 1000.0000 mg | ORAL_TABLET | Freq: Four times a day (QID) | ORAL | 0 refills | Status: AC | PRN

## 2023-05-17 NOTE — Progress Notes (Addendum)
Pt asked if D/C teaching could be done once his wife arrives. JP care added to AVS. PIV x 2 removed as noted. Pt dressing for d/c to home. Pt's pharmacy opens at 1pm today. This RN will do teaching w/ pt & spouse  once she arrives. Primary RN updated

## 2023-05-17 NOTE — Discharge Summary (Signed)
Physician Discharge Summary  Patient ID: Charles Norton MRN: [id number] DOB/AGE: 1940-11-09 82 y.o.  Admit date: 05/14/2023 Discharge date: 05/17/2023  Admission Diagnoses: Rectal Prolapse Discharge Diagnoses:  Principal Problem:   Rectal prolapse   Discharged Condition: good  Hospital Course: Patient was admitted to the med surg floor after surgery.  Diet was advanced as tolerated.  By postop day 3, he was tolerating a solid diet and pain was controlled with oral medications.  He was urinating without difficulty and ambulating without assistance.  Patient was felt to be in stable condition for discharge to home.   Consults: None  Significant Diagnostic Studies: labs: cbc, bmet  Treatments: analgesia: acetaminophen and surgery: robotic rectopexy  Discharge Exam: Blood pressure 119/75, pulse 81, temperature 98.3 F (36.8 C), temperature source Oral, resp. rate 18, height 5\' 8"  (1.727 m), weight 71.5 kg, SpO2 96%. General appearance: alert and cooperative GI: soft, non-distended Incision/Wound: C/D/I  Disposition: Discharge disposition: 01-Home or Self Care        Allergies as of 05/17/2023   No Known Allergies      Medication List     TAKE these medications    acetaminophen 500 MG tablet Commonly known as: TYLENOL Take 2 tablets (1,000 mg total) by mouth every 6 (six) hours as needed.   BENEFIBER PO Take 1 Dose by mouth 2 (two) times daily. 1 dose = 3 teaspoons Mixed with coffee   carboxymethylcellulose 0.5 % Soln Commonly known as: REFRESH PLUS Place 1 drop into both eyes in the morning.   cholecalciferol 25 MCG (1000 UNIT) tablet Commonly known as: VITAMIN D3 Take 1,000 Units by mouth daily.   Fish Oil 1000 MG Caps Take 1 capsule by mouth in the morning.   ICAPS LUTEIN-ZEAXANTHIN PO Take 1 tablet by mouth in the morning.   magnesium oxide 400 MG tablet Commonly known as: MAG-OX Take 400 mg by mouth in the morning.   MULTI VITAMIN DAILY  PO Take 1 tablet by mouth in the morning.   polyethylene glycol 17 g packet Commonly known as: MIRALAX / GLYCOLAX Take 17 g by mouth 2 (two) times daily.   rosuvastatin 5 MG tablet Commonly known as: CRESTOR Take 5 mg by mouth at bedtime.   TURMERIC PO Take 800 mg by mouth daily.   vitamin C 1000 MG tablet Take 1,000 mg by mouth in the morning.        Follow-up Information     [facility] Follow up on 05/21/2023.   Specialty: General Surgery Why: for drain removal Contact information: [address] [place] [address] [phone]        Romie Levee, MD. Schedule an appointment as soon as possible for a visit in 2 week(s).   Specialties: General Surgery, Colon and Rectal Surgery Contact information: [address] [place] [address] [place] [address] [phone]                 Signed: Vanita Panda 05/17/2023, 8:40 AM

## 2023-05-17 NOTE — Discharge Instructions (Addendum)
SURGERY: POST OP INSTRUCTIONS (Surgery for small bowel obstruction, colon resection, etc)   ######################################################################  EAT Gradually transition to a high fiber diet with a fiber supplement over the next few days after discharge  WALK Walk an hour a day.  Control your pain to do that.    CONTROL PAIN Control pain so that you can walk, sleep, tolerate sneezing/coughing, go up/down stairs.  HAVE A BOWEL MOVEMENT DAILY Keep your bowels regular to avoid problems.  OK to try a laxative to override constipation.  OK to use an antidairrheal to slow down diarrhea.  Call if not better after 2 tries  CALL IF YOU HAVE PROBLEMS/CONCERNS Call if you are still struggling despite following these instructions. Call if you have concerns not answered by these instructions  ######################################################################   DIET Follow a light diet the first few days at home.  Start with a bland diet such as soups, liquids, starchy foods, low fat foods, etc.  If you feel full, bloated, or constipated, stay on a ful liquid or pureed/blenderized diet for a few days until you feel better and no longer constipated. Be sure to drink plenty of fluids every day to avoid getting dehydrated (feeling dizzy, not urinating, etc.). Gradually add a fiber supplement to your diet over the next week.  Gradually get back to a regular solid diet.  Avoid fast food or heavy meals the first week as you are more likely to get nauseated. It is expected for your digestive tract to need a few months to get back to normal.  It is common for your bowel movements and stools to be irregular.  You will have occasional bloating and cramping that should eventually fade away.  Until you are eating solid food normally, off all pain medications, and back to regular activities; your bowels will not be normal. Focus on eating a low-fat, high fiber diet the rest of your life  (See Getting to Good Bowel Health, below).  CARE of your INCISION or WOUND  It is good for closed incisions and even open wounds to be washed every day.  Shower every day.  Short baths are fine.  Wash the incisions and wounds clean with soap & water.    You may leave closed incisions open to air if it is dry.   You may cover the incision with clean gauze & replace it after your daily shower for comfort.  STAPLES: You have skin staples.  Leave them in place & set up an appointment for them to be removed by a surgery office nurse ~10 days after surgery. = 1st week of January 2024    ACTIVITIES as tolerated Start light daily activities --- self-care, walking, climbing stairs-- beginning the day after surgery.  Gradually increase activities as tolerated.  Control your pain to be active.  Stop when you are tired.  Ideally, walk several times a day, eventually an hour a day.   Most people are back to most day-to-day activities in a few weeks.  It takes 4-8 weeks to get back to unrestricted, intense activity. If you can walk 30 minutes without difficulty, it is safe to try more intense activity such as jogging, treadmill, bicycling, low-impact aerobics, swimming, etc. Save the most intensive and strenuous activity for last (Usually 4-8 weeks after surgery) such as sit-ups, heavy lifting, contact sports, etc.  Refrain from any intense heavy lifting or straining until you are off narcotics for pain control.  You will have off days, but things should improve   week-by-week. DO NOT PUSH THROUGH PAIN.  Let pain be your guide: If it hurts to do something, don't do it.  Pain is your body warning you to avoid that activity for another week until the pain goes down. You may drive when you are no longer taking narcotic prescription pain medication, you can comfortably wear a seatbelt, and you can safely make sudden turns/stops to protect yourself without hesitating due to pain. You may have sexual intercourse when it  is comfortable. If it hurts to do something, stop.  MEDICATIONS Take your usually prescribed home medications unless otherwise directed.   Blood thinners:  Usually you can restart any strong blood thinners after the second postoperative day.  It is OK to take aspirin right away.     If you are on strong blood thinners (warfarin/Coumadin, Plavix, Xerelto, Eliquis, Pradaxa, etc), discuss with your surgeon, medicine PCP, and/or cardiologist for instructions on when to restart the blood thinner & if blood monitoring is needed (PT/INR blood check, etc).     PAIN CONTROL Pain after surgery or related to activity is often due to strain/injury to muscle, tendon, nerves and/or incisions.  This pain is usually short-term and will improve in a few months.  To help speed the process of healing and to get back to regular activity more quickly, DO THE FOLLOWING THINGS TOGETHER: Increase activity gradually.  DO NOT PUSH THROUGH PAIN Use Ice and/or Heat Try Gentle Massage and/or Stretching Take over the counter pain medication Take Narcotic prescription pain medication for more severe pain  Good pain control = faster recovery.  It is better to take more medicine to be more active than to stay in bed all day to avoid medications.  Increase activity gradually Avoid heavy lifting at first, then increase to lifting as tolerated over the next 6 weeks. Do not "push through" the pain.  Listen to your body and avoid positions and maneuvers than reproduce the pain.  Wait a few days before trying something more intense Walking an hour a day is encouraged to help your body recover faster and more safely.  Start slowly and stop when getting sore.  If you can walk 30 minutes without stopping or pain, you can try more intense activity (running, jogging, aerobics, cycling, swimming, treadmill, sex, sports, weightlifting, etc.) Remember: If it hurts to do it, then don't do it! Use Ice and/or Heat You will have swelling and  bruising around the incisions.  This will take several weeks to resolve. Ice packs or heating pads (6-8 times a day, 30-60 minutes at a time) will help sooth soreness & bruising. Some people prefer to use ice alone, heat alone, or alternate between ice & heat.  Experiment and see what works best for you.  Consider trying ice for the first few days to help decrease swelling and bruising; then, switch to heat to help relax sore spots and speed recovery. Shower every day.  Short baths are fine.  It feels good!  Keep the incisions and wounds clean with soap & water.   Try Gentle Massage and/or Stretching Massage at the area of pain many times a day Stop if you feel pain - do not overdo it Take over the counter pain medication This helps the muscle and nerve tissues become less irritable and calm down faster Choose ONE of the following over-the-counter anti-inflammatory medications: Acetaminophen 500mg tabs (Tylenol) 1-2 pills with every meal and just before bedtime (avoid if you have liver problems or if you have   acetaminophen in you narcotic prescription) Naproxen 220mg tabs (ex. Aleve, Naprosyn) 1-2 pills twice a day (avoid if you have kidney, stomach, IBD, or bleeding problems) Ibuprofen 200mg tabs (ex. Advil, Motrin) 3-4 pills with every meal and just before bedtime (avoid if you have kidney, stomach, IBD, or bleeding problems) Take with food/snack several times a day as directed for at least 2 weeks to help keep pain / soreness down & more manageable. Take Narcotic prescription pain medication for more severe pain A prescription for strong pain control is often given to you upon discharge (for example: oxycodone/Percocet, hydrocodone/Norco/Vicodin, or tramadol/Ultram) Take your pain medication as prescribed. Be mindful that most narcotic prescriptions contain Tylenol (acetaminophen) as well - avoid taking too much Tylenol. If you are having problems/concerns with the prescription medicine (does  not control pain, nausea, vomiting, rash, itching, etc.), please call us [phone] to see if we need to switch you to a different pain medicine that will work better for you and/or control your side effects better. If you need a refill on your pain medication, you must call the office before 4 pm and on weekdays only.  By federal law, prescriptions for narcotics cannot be called into a pharmacy.  They must be filled out on paper & picked up from our office by the patient or authorized caretaker.  Prescriptions cannot be filled after 4 pm nor on weekends.    WHEN TO CALL US [phone] Severe uncontrolled or worsening pain  Fever over 101 F (38.5 C) Concerns with the incision: Worsening pain, redness, rash/hives, swelling, bleeding, or drainage Reactions / problems with new medications (itching, rash, hives, nausea, etc.) Nausea and/or vomiting Difficulty urinating Difficulty breathing Worsening fatigue, dizziness, lightheadedness, blurred vision Other concerns If you are not getting better after two weeks or are noticing you are getting worse, contact our office [phone] for further advice.  We may need to adjust your medications, re-evaluate you in the office, send you to the emergency room, or see what other things we can do to help. The clinic staff is available to answer your questions during regular business hours (8:30am-5pm).  Please don't hesitate to call and ask to speak to one of our nurses for clinical concerns.    A surgeon from [facility] is always on call at the hospitals 24 hours/day If you have a medical emergency, go to the nearest emergency room or call 911.  FOLLOW UP in our office One the day of your discharge from the hospital (or the next business weekday), please call [facility] to set up or confirm an appointment to see your surgeon in the office for a follow-up appointment.  Usually it is 2-3 weeks after your surgery.   If you  have skin staples at your incision(s), let the office know so we can set up a time in the office for the nurse to remove them (usually around 10 days after surgery). Make sure that you call for appointments the day of discharge (or the next business weekday) from the hospital to ensure a convenient appointment time. IF YOU HAVE DISABILITY OR FAMILY LEAVE FORMS, BRING THEM TO THE OFFICE FOR PROCESSING.  DO NOT GIVE THEM TO YOUR DOCTOR.  [facility] [address], [address], [place], [place]  [address] ? [phone] - Main [phone] - Toll Free,  [phone] - Fax [email or web address]    GETTING TO GOOD BOWEL HEALTH. It is expected for your digestive tract to   need a few months to get back to normal.  It is common for your bowel movements and stools to be irregular.  You will have occasional bloating and cramping that should eventually fade away.  Until you are eating solid food normally, off all pain medications, and back to regular activities; your bowels will not be normal.   Avoiding constipation The goal: ONE SOFT BOWEL MOVEMENT A DAY!    Drink plenty of fluids.  Choose water first. TAKE A FIBER SUPPLEMENT EVERY DAY THE REST OF YOUR LIFE During your first week back home, gradually add back a fiber supplement every day Experiment which form you can tolerate.   There are many forms such as powders, tablets, wafers, gummies, etc Psyllium bran (Metamucil), methylcellulose (Citrucel), Miralax or Glycolax, Benefiber, Flax Seed.  Adjust the dose week-by-week (1/2 dose/day to 6 doses a day) until you are moving your bowels 1-2 times a day.  Cut back the dose or try a different fiber product if it is giving you problems such as diarrhea or bloating. Sometimes a laxative is needed to help jump-start bowels if constipated until the fiber supplement can help regulate your bowels.  If you are tolerating eating & you are farting, it is okay to try a gentle  laxative such as double dose MiraLax, prune juice, or Milk of Magnesia.  Avoid using laxatives too often. Stool softeners can sometimes help counteract the constipating effects of narcotic pain medicines.  It can also cause diarrhea, so avoid using for too long. If you are still constipated despite taking fiber daily, eating solids, and a few doses of laxatives, call our office. Controlling diarrhea Try drinking liquids and eating bland foods for a few days to avoid stressing your intestines further. Avoid dairy products (especially milk & ice cream) for a short time.  The intestines often can lose the ability to digest lactose when stressed. Avoid foods that cause gassiness or bloating.  Typical foods include beans and other legumes, cabbage, broccoli, and dairy foods.  Avoid greasy, spicy, fast foods.  Every person has some sensitivity to other foods, so listen to your body and avoid those foods that trigger problems for you. Probiotics (such as active yogurt, Align, etc) may help repopulate the intestines and colon with normal bacteria and calm down a sensitive digestive tract Adding a fiber supplement gradually can help thicken stools by absorbing excess fluid and retrain the intestines to act more normally.  Slowly increase the dose over a few weeks.  Too much fiber too soon can backfire and cause cramping & bloating. It is okay to try and slow down diarrhea with a few doses of antidiarrheal medicines.   Bismuth subsalicylate (ex. Kayopectate, Pepto Bismol) for a few doses can help control diarrhea.  Avoid if pregnant.   Loperamide (Imodium) can slow down diarrhea.  Start with one tablet (2mg) first.  Avoid if you are having fevers or severe pain.  ILEOSTOMY PATIENTS WILL HAVE CHRONIC DIARRHEA since their colon is not in use.    Drink plenty of liquids.  You will need to drink even more glasses of water/liquid a day to avoid getting dehydrated. Record output from your ileostomy.  Expect to empty  the bag every 3-4 hours at first.  Most people with a permanent ileostomy empty their bag 4-6 times at the least.   Use antidiarrheal medicine (especially Imodium) several times a day to avoid getting dehydrated.  Start with a dose at bedtime & breakfast.  Adjust up or   down as needed.  Increase antidiarrheal medications as directed to avoid emptying the bag more than 8 times a day (every 3 hours). Work with your wound ostomy nurse to learn care for your ostomy.  See ostomy care instructions. TROUBLESHOOTING IRREGULAR BOWELS 1) Start with a soft & bland diet. No spicy, greasy, or fried foods.  2) Avoid gluten/wheat or dairy products from diet to see if symptoms improve. 3) Miralax 17gm or flax seed mixed in 8oz. water or juice-daily. May use 2-4 times a day as needed. 4) Gas-X, Phazyme, etc. as needed for gas & bloating.  5) Prilosec (omeprazole) over-the-counter as needed 6)  Consider probiotics (Align, Activa, etc) to help calm the bowels down  Call your doctor if you are getting worse or not getting better.  Sometimes further testing (cultures, endoscopy, X-ray studies, CT scans, bloodwork, etc.) may be needed to help diagnose and treat the cause of the diarrhea. [facility] [address], [address], [place], [place]  [address] [phone] - Main.    [phone]  - Toll Free.   [phone] - Fax [email or web address]   ###############################   #######################################################  Ostomy Support Information  You've heard that people get along just fine with only one of their eyes, or one of their lungs, or one of their kidneys. But you also know that you have only one intestine and only one bladder, and that leaves you feeling awfully empty, both physically and emotionally: You think no other people go around without part of their intestine with the ends of their intestines sticking out through their abdominal walls.    YOU ARE NOT ALONE.  There are nearly three quarters of a million people in the [place] who have an ostomy; people who have had surgery to remove all or part of their colons or bladders.   There is even a national association, the [employer] with over 350 local affiliated support groups that are organized by volunteers who provide peer support and counseling. [employer] has a toll free telephone num-ber, 800-826-0826 and an educational, interactive website, www.ostomy.org   An ostomy is an opening in the belly (abdominal wall) made by surgery. Ostomates are people who have had this procedure. The opening (stoma) allows the kidney or bowel to grdischarge waste. An external pouch covers the stoma to collect waste. Pouches are are a simple bag and are odor free. Different companies have disposable or reusable pouches to fit one's lifestyle. An ostomy can either be temporary or permanent.   THERE ARE THREE MAIN TYPES OF OSTOMIES Colostomy. A colostomy is a surgically created opening in the large intestine (colon). Ileostomy. An ileostomy is a surgically created opening in the small intestine. Urostomy. A urostomy is a surgically created opening to divert urine away from the bladder.  OSTOMY Care  The following guidelines will make care of your colostomy easier. Keep this information close by for quick reference.  Helpful DIET hints Eat a well-balanced diet including vegetables and fresh fruits. Eat on a regular schedule.  Drink at least 6 to 8 glasses of fluids daily. Eat slowly in a relaxed atmosphere. Chew your food thoroughly. Avoid chewing gum, smoking, and drinking from a straw. This will help decrease the amount of air you swallow, which may help reduce gas. Eating yogurt or drinking buttermilk may help reduce gas.  To control gas at night, do not eat after 8 p.m. This will give your bowel time to quiet down before you go   to bed.  If gas is a problem, you can purchase  Beano. Sprinkle Beano on the first bite of food before eating to reduce gas. It has no flavor and should not change the taste of your food. You can buy Beano over the counter at your local drugstore.  Foods like fish, onions, garlic, broccoli, asparagus, and cabbage produce odor. Although your pouch is odor-proof, if you eat these foods you may notice a stronger odor when emptying your pouch. If this is a concern, you may want to limit these foods in your diet.  If you have an ileostomy, you will have chronic diarrhea & need to drink more liquids to avoid getting dehydrated.  Consider antidiarrheal medicine like imodium (loperamide) or Lomotil to help slow down bowel movements / diarrhea into your ileostomy bag.  GETTING TO GOOD BOWEL HEALTH WITH AN ILEOSTOMY    With the colon bypassed & not in use, you will have small bowel diarrhea.   It is important to thicken & slow your bowel movements down.   The goal: 4-6 small BOWEL MOVEMENTS A DAY It is important to drink plenty of liquids to avoid getting dehydrated  CONTROLLING ILEOSTOMY DIARRHEA  TAKE A FIBER SUPPLEMENT (FiberCon or Benefiner soluble fiber) twice a day - to thicken stools by absorbing excess fluid and retrain the intestines to act more normally.  Slowly increase the dose over a few weeks.  Too much fiber too soon can backfire and cause cramping & bloating.  TAKE AN IRON SUPPLEMENT twice a day to naturally constipate your bowels.  Usually ferrous sulfate 325mg twice a day)  TAKE ANTI-DIARRHEAL MEDICINES: Loperamide (Imodium) can slow down diarrhea.  Start with two tablets (= 4mg) first and then try one tablet every 6 hours.  Can go up to 2 pills four times day (8 pills of 2mg max) Avoid if you are having fevers or severe pain.  If you are not better or start feeling worse, stop all medicines and call your doctor for advice LoMotil (Diphenoxylate / Atropine) is another medicine that can constipate & slow down bowel moevements Pepto  Bismol (bismuth) can gently thicken bowels as well  If diarrhea is worse,: drink plenty of liquids and try simpler foods for a few days to avoid stressing your intestines further. Avoid dairy products (especially milk & ice cream) for a short time.  The intestines often can lose the ability to digest lactose when stressed. Avoid foods that cause gassiness or bloating.  Typical foods include beans and other legumes, cabbage, broccoli, and dairy foods.  Every person has some sensitivity to other foods, so listen to our body and avoid those foods that trigger problems for you.Call your doctor if you are getting worse or not better.  Sometimes further testing (cultures, endoscopy, X-ray studies, bloodwork, etc) may be needed to help diagnose and treat the cause of the diarrhea. Take extra anti-diarrheal medicines (maximum is 8 pills of 2mg loperamide a day)   Tips for POUCHING an OSTOMY   Changing Your Pouch The best time to change your pouch is in the morning, before eating or drinking anything. Your stoma can function at any time, but it will function more after eating or drinking.   Applying the pouching system  Place all your equipment close at hand before removing your pouch.  Wash your hands.  Stand or sit in front of a mirror. Use the position that works best for you. Remember that you must keep the skin around the stoma   wrinkle-free for a good seal.  Gently remove the used pouch (1-piece system) or the pouch and old wafer (2-piece system). Empty the pouch into the toilet. Save the closure clip to use again.  Wash the stoma itself and the skin around the stoma. Your stoma may bleed a little when being washed. This is normal. Rinse and pat dry. You may use a wash cloth or soft paper towels (like Bounty), mild soap (like Dial, Safeguard, or Ivory), and water. Avoid soaps that contain perfumes or lotions.  For a new pouch (1-piece system) or a new wafer (2-piece system), measure your  stoma using the stoma guide in each box of supplies.  Trace the shape of your stoma onto the back of the new pouch or the back of the new wafer. Cut out the opening. Remove the paper backing and set it aside.  Optional: Apply a skin barrier powder to surrounding skin if it is irritated (bare or weeping), and dust off the excess. Optional: Apply a skin-prep wipe (such as Skin Prep or All-Kare) to the skin around the stoma, and let it dry. Do not apply this solution if the skin is irritated (red, tender, or broken) or if you have shaved around the stoma. Optional: Apply a skin barrier paste (such as Stomahesive, Coloplast, or Premium) around the opening cut in the back of the pouch or wafer. Allow it to dry for 30 to 60 seconds.  Hold the pouch (1-piece system) or wafer (2-piece system) with the sticky side toward your body. Make sure the skin around the stoma is wrinkle-free. Center the opening on the stoma, then press firmly to your abdomen (Fig. 4). Look in the mirror to check if you are placing the pouch, or wafer, in the right position. For a 2-piece system, snap the pouch onto the wafer. Make sure it snaps into place securely.  Place your hand over the stoma and the pouch or wafer for about 30 seconds. The heat from your hand can help the pouch or wafer stick to your skin.  Add deodorant (such as Super Banish or Nullo) to your pouch. Other options include food extracts such as vanilla oil and peppermint extract. Add about 10 drops of the deodorant to the pouch. Then apply the closure clamp. Note: Do not use toxic  chemicals or commercial cleaning agents in your pouch. These substances may harm the stoma.  Optional: For extra seal, apply tape to all 4 sides around the pouch or wafer, as if you were framing a picture. You may use any brand of medical adhesive tape. Change your pouch every 5 to 7 days. Change it immediately if a leak occurs.  Wash your hands afterwards.  If you are wearing a  2-piece system, you may use 2 new pouches per week and alternate them. Rinse the pouch with mild soap and warm water and hang it to dry for the next day. Apply the fresh pouch. Alternate the 2 pouches like this for a week. After a week, change the wafer and begin with 2 new pouches. Place the old pouches in a plastic bag, and put them in the trash.   LIVING WITH AN OSTOMY  Emptying Your Pouch Empty your pouch when it is one-third full (of urine, stool, and/or gas). If you wait until your pouch is fuller than this, it will be more difficult to empty and more noticeable. When you empty your pouch, either put toilet paper in the toilet bowl first, or flush the   toilet while you empty the pouch. This will reduce splashing. You can empty the pouch between your legs or to one side while sitting, or while standing or stooping. If you have a 2-piece system, you can snap off the pouch to empty it. Remember that your stoma may function during this time. If you wish to rinse your pouch after you empty it, a turkey baster can be helpful. When using a baster, squirt water up into the pouch through the opening at the bottom. With a 2-piece system, you can snap off the pouch to rinse it. After rinsing  your pouch, empty it into the toilet. When rinsing your pouch at home, put a few granules of Dreft soap in the rinse water. This helps lubricate and freshen your pouch. The inside of your pouch can be sprayed with non-stick cooking oil (Pam spray). This may help reduce stool sticking to the inside of the pouch.  Bathing You may shower or bathe with your pouch on or off. Remember that your stoma may function during this time.  The materials you use to wash your stoma and the skin around it should be clean, but they do not need to be sterile.  Wearing Your Pouch During hot weather, or if you perspire a lot in general, wear a cover over your pouch. This may prevent a rash on your skin under the pouch. Pouch covers are  sold at ostomy supply stores. Wear the pouch inside your underwear for better support. Watch your weight. Any gain or loss of 10 to 15 pounds or more can change the way your pouch fits.  Going Away From Home A collapsible cup (like those that come in travel kits) or a soft plastic squirt bottle with a pull-up top (like a travel bottle for shampoo) can be used for rinsing your pouch when you are away from home. Tilt the opening of the pouch at an upward angle when using a cup to rinse.  Carry wet wipes or extra tissues to use in public bathrooms.  Carry an extra pouching system with you at all times.  Never keep ostomy supplies in the glove compartment of your car. Extreme heat or cold can damage the skin barriers and adhesive wafers on the pouch.  When you travel, carry your ostomy supplies with you at all times. Keep them within easy reach. Do not pack ostomy supplies in baggage that will be checked or otherwise separated from you, because your baggage might be lost. If you're traveling out of the country, it is helpful to have a letter stating that you are carrying ostomy supplies as a medical necessity.  If you need ostomy supplies while traveling, look in the yellow pages of the telephone book under "Surgical Supplies." Or call the local ostomy organization to find out where supplies are available.  Do not let your ostomy supplies get low. Always order new pouches before you use the last one.  Reducing Odor Limit foods such as broccoli, cabbage, onions, fish, and garlic in your diet to help reduce odor. Each time you empty your pouch, carefully clean the opening of the pouch, both inside and outside, with toilet paper. Rinse your pouch 1 or 2 times daily after you empty it (see directions for emptying your pouch and going away from home). Add deodorant (such as Super Banish or Nullo) to your pouch. Use air deodorizers in your bathroom. Do not add aspirin to your pouch. Even though  aspirin can help prevent odor, it   could cause ulcers on your stoma.  When to call the doctor Call the doctor if you have any of the following symptoms: Purple, black, or white stoma Severe cramps lasting more than 6 hours Severe watery discharge from the stoma lasting more than 6 hours No output from the colostomy for 3 days Excessive bleeding from your stoma Swelling of your stoma to more than 1/2-inch larger than usual Pulling inward of your stoma below skin level Severe skin irritation or deep ulcers Bulging or other changes in your abdomen  When to call your ostomy nurse Call your ostomy/enterostomal therapy (WOCN) nurse if any of the following occurs: Frequent leaking of your pouching system Change in size or appearance of your stoma, causing discomfort or problems with your pouch Skin rash or rawness Weight gain or loss that causes problems with your pouch     FREQUENTLY ASKED QUESTIONS   Why haven't you met any of these folks who have an ostomy?  Well, maybe you have! You just did not recognize them because an ostomy doesn't show. It can be kept secret if you wish. Why, maybe some of your best friends, office associates or neighbors have an ostomy ... you never can tell. People facing ostomy surgery have many quality-of-life questions like: Will you bulge? Smell? Make noises? Will you feel waste leaving your body? Will you be a captive of the toilet? Will you starve? Be a social outcast? Get/stay married? Have babies? Easily bathe, go swimming, bend over?  OK, let's look at what you can expect:   Will you bulge?  Remember, without part of the intestine or bladder, and its contents, you should have a flatter tummy than before. You can expect to wear, with little exception, what you wore before surgery ... and this in-cludes tight clothing and bathing suits.   Will you smell?  Today, thanks to modern odor proof pouching systems, you can walk into an ostomy support group  meeting and not smell anything that is foul or offensive. And, for those with an ileostomy or colostomy who are concerned about odor when emptying their pouch, there are in-pouch deodorants that can be used to eliminate any waste odors that may exist.   Will you make noises?  Everyone produces gas, especially if they are an air-swallower. But intestinal sounds that occur from time to time are no differ-ent than a gurgling tummy, and quite often your clothing will muffle any sounds.   Will you feel the waste discharges?  For those with a colostomy or ileostomy there might be a slight pressure when waste leaves your body, but understand that the intestines have no nerve endings, so there will be no unpleasant sensations. Those with a urostomy will probably be unaware of any kidney drainage.   Will you be a captive of the toilet?  Immediately post-op you will spend more time in the bathroom than you will after your body recovers from surgery. Every person is different, but on average those with an ileostomy or urostomy may empty their pouches 4 to 6 times a day; a little  less if you have a colostomy. The average wear time between pouch system changes is 3 to 5 days and the changing process should take less than 30 minutes.   Will I need to be on a special diet? Most people return to their normal diet when they have recovered from surgery. Be sure to chew your food well, eat a well-balanced diet and drink plenty of fluids. If   you experience problems with a certain food, wait a couple of weeks and try it again.  Will there be odor and noises? Pouching systems are designed to be odor-proof or odor-resistant. There are deodorants that can be used in the pouch. Medications are also available to help reduce odor. Limit gas-producing foods and carbonated beverages. You will experience less gas and fewer noises as you heal from surgery.  How much time will it take to care for my ostomy? At first, you may  spend a lot of time learning about your ostomy and how to take care of it. As you become more comfortable and skilled at changing the pouching system, it will take very little time to care for it.   Will I be able to return to work? People with ostomies can perform most jobs. As soon as you have healed from surgery, you should be able to return to work. Heavy lifting (more than 10 pounds) may be discouraged.   What about intimacy? Sexual relationships and intimacy are important and fulfilling aspects of your life. They should continue after ostomy surgery. Intimacy-related concerns should be discussed openly between you and your partner.   Can I wear regular clothing? You do not need to wear special clothing. Ostomy pouches are fairly flat and barely noticeable. Elastic undergarments will not hurt the stoma or prevent the ostomy from functioning.   Can I participate in sports? An ostomy should not limit your involvement in sports. Many people with ostomies are runners, skiers, swimmers or participate in other active lifestyles. Talk with your caregiver first before doing heavy physical activity.  Will you starve?  Not if you follow doctor's orders at each stage of your post-op adjustment. There is no such thing as an "ostomy diet". Some people with an ostomy will be able to eat and tolerate anything; others may find diffi-culty with some foods. Each person is an individual and must determine, by trial, what is best for them. A good practice for all is to drink plenty of water.   Will you be a social outcast?  Have you met anyone who has an ostomy and is a social outcast? Why should you be the first? Only your attitude and self image will effect how you are treated. No confi-dent person is an outcast.    PROFESSIONAL HELP   Resources are available if you need help or have questions about your ostomy.   Specially trained nurses called Wound, Ostomy Continence Nurses (WOCN) are available for  consultation in most major medical centers.  Consider getting an ostomy consult at an outpatient ostomy clinic.   [facility] has an [facility] run by an WOCN ostomy nurse at the [facility].  [phone]. [facility] can help set up an appointment   The [employer] (UOA) is a group made up of many local chapters throughout the [place]. These local groups hold meetings and provide support to prospective and existing ostomates. They sponsor educational events and have qualified visitors to make personal or telephone visits. Contact the UOA for the chapter nearest you and for other educational publications.  More detailed information can be found in Colostomy Guide, a publication of the [employer] (UOA). Contact UOA at 1-800-826-0826 or visit their web site at www.uoaa.org. The website contains links to other sites, suppliers and resources.  [facility] Secure Start Services: Start at the website to enlist for support.  Your Wound Ostomy (WOCN) nurse may have started this   process. https://[email or web address].[email or web address]/securestart Secure Start services are designed to support people as they live their lives with an ostomy or neurogenic bladder. Enrolling is easy and at no cost to the patient. We realize that each person's needs and life journey are different. Through Secure Start services, we want to help people live their life, their way.  #######################################################  

## 2023-05-17 NOTE — Progress Notes (Signed)
AVS reviewed w/ pt & wife - both verbalized an understanding - JP teaching done - to lobby via w/c

## 2023-05-18 LAB — TYPE AND SCREEN
ABO/RH(D): O POS
Antibody Screen: POSITIVE
Unit division: 0
Unit division: 0

## 2023-05-18 LAB — BPAM RBC
Blood Product Expiration Date: 202501132359
Blood Product Expiration Date: 202501132359
Unit Type and Rh: 5100
Unit Type and Rh: 5100
